# Patient Record
Sex: Female | Born: 1940 | Race: White | Hispanic: No | Smoking: Former smoker
Health system: Southern US, Community
[De-identification: ages and names within clinical notes are randomized; demographics above are authoritative.]

## PROBLEM LIST (undated history)

## (undated) DIAGNOSIS — I10 Essential (primary) hypertension: Secondary | ICD-10-CM

## (undated) DIAGNOSIS — R55 Syncope and collapse: Secondary | ICD-10-CM

## (undated) DIAGNOSIS — H269 Unspecified cataract: Secondary | ICD-10-CM

## (undated) DIAGNOSIS — I4510 Unspecified right bundle-branch block: Secondary | ICD-10-CM

## (undated) DIAGNOSIS — I441 Atrioventricular block, second degree: Secondary | ICD-10-CM

## (undated) DIAGNOSIS — D649 Anemia, unspecified: Secondary | ICD-10-CM

## (undated) DIAGNOSIS — Z515 Encounter for palliative care: Secondary | ICD-10-CM

## (undated) DIAGNOSIS — R001 Bradycardia, unspecified: Secondary | ICD-10-CM

## (undated) DIAGNOSIS — Z7189 Other specified counseling: Secondary | ICD-10-CM

## (undated) DIAGNOSIS — K631 Perforation of intestine (nontraumatic): Secondary | ICD-10-CM

## (undated) DIAGNOSIS — F0390 Unspecified dementia without behavioral disturbance: Secondary | ICD-10-CM

## (undated) DIAGNOSIS — I058 Other rheumatic mitral valve diseases: Secondary | ICD-10-CM

## (undated) DIAGNOSIS — N184 Chronic kidney disease, stage 4 (severe): Secondary | ICD-10-CM

## (undated) DIAGNOSIS — E039 Hypothyroidism, unspecified: Secondary | ICD-10-CM

## (undated) HISTORY — DX: Atrioventricular block, second degree: I44.1

## (undated) HISTORY — DX: Other rheumatic mitral valve diseases: I05.8

## (undated) HISTORY — DX: Other specified counseling: Z71.89

## (undated) HISTORY — DX: Unspecified right bundle-branch block: I45.10

## (undated) HISTORY — DX: Encounter for palliative care: Z51.5

---

## 2015-12-10 ENCOUNTER — Other Ambulatory Visit: Payer: Self-pay | Admitting: Nephrology

## 2015-12-10 DIAGNOSIS — I10 Essential (primary) hypertension: Secondary | ICD-10-CM

## 2015-12-10 DIAGNOSIS — N183 Chronic kidney disease, stage 3 unspecified: Secondary | ICD-10-CM

## 2015-12-29 ENCOUNTER — Ambulatory Visit
Admission: RE | Admit: 2015-12-29 | Discharge: 2015-12-29 | Disposition: A | Discharge status: Home / Self Care | Payer: Medicare Other | Source: Ambulatory Visit | Attending: Nephrology | Admitting: Nephrology

## 2015-12-29 ENCOUNTER — Other Ambulatory Visit: Payer: Self-pay | Admitting: Nephrology

## 2015-12-29 DIAGNOSIS — N183 Chronic kidney disease, stage 3 unspecified: Secondary | ICD-10-CM

## 2015-12-29 DIAGNOSIS — I10 Essential (primary) hypertension: Secondary | ICD-10-CM

## 2018-05-14 ENCOUNTER — Emergency Department (HOSPITAL_COMMUNITY): Payer: Medicare Other

## 2018-05-14 ENCOUNTER — Encounter (HOSPITAL_COMMUNITY): Payer: Self-pay | Admitting: *Deleted

## 2018-05-14 ENCOUNTER — Inpatient Hospital Stay (HOSPITAL_COMMUNITY)
Admission: EM | Admit: 2018-05-14 | Discharge: 2018-05-17 | DRG: 682 | Disposition: A | Discharge status: Home / Self Care | Payer: Medicare Other | Source: Skilled Nursing Facility | Attending: Internal Medicine | Admitting: Internal Medicine

## 2018-05-14 ENCOUNTER — Other Ambulatory Visit: Payer: Self-pay

## 2018-05-14 DIAGNOSIS — E871 Hypo-osmolality and hyponatremia: Secondary | ICD-10-CM | POA: Diagnosis present

## 2018-05-14 DIAGNOSIS — Z79890 Hormone replacement therapy (postmenopausal): Secondary | ICD-10-CM | POA: Diagnosis not present

## 2018-05-14 DIAGNOSIS — X58XXXA Exposure to other specified factors, initial encounter: Secondary | ICD-10-CM | POA: Diagnosis present

## 2018-05-14 DIAGNOSIS — N183 Chronic kidney disease, stage 3 (moderate): Secondary | ICD-10-CM | POA: Diagnosis present

## 2018-05-14 DIAGNOSIS — F0281 Dementia in other diseases classified elsewhere with behavioral disturbance: Secondary | ICD-10-CM | POA: Diagnosis present

## 2018-05-14 DIAGNOSIS — M109 Gout, unspecified: Secondary | ICD-10-CM | POA: Diagnosis present

## 2018-05-14 DIAGNOSIS — E039 Hypothyroidism, unspecified: Secondary | ICD-10-CM | POA: Diagnosis present

## 2018-05-14 DIAGNOSIS — G9340 Encephalopathy, unspecified: Secondary | ICD-10-CM

## 2018-05-14 DIAGNOSIS — I10 Essential (primary) hypertension: Secondary | ICD-10-CM

## 2018-05-14 DIAGNOSIS — D631 Anemia in chronic kidney disease: Secondary | ICD-10-CM | POA: Diagnosis present

## 2018-05-14 DIAGNOSIS — I5030 Unspecified diastolic (congestive) heart failure: Secondary | ICD-10-CM | POA: Diagnosis not present

## 2018-05-14 DIAGNOSIS — R32 Unspecified urinary incontinence: Secondary | ICD-10-CM | POA: Diagnosis present

## 2018-05-14 DIAGNOSIS — Z79899 Other long term (current) drug therapy: Secondary | ICD-10-CM | POA: Diagnosis not present

## 2018-05-14 DIAGNOSIS — F0390 Unspecified dementia without behavioral disturbance: Secondary | ICD-10-CM | POA: Diagnosis present

## 2018-05-14 DIAGNOSIS — G9341 Metabolic encephalopathy: Secondary | ICD-10-CM | POA: Diagnosis present

## 2018-05-14 DIAGNOSIS — I5032 Chronic diastolic (congestive) heart failure: Secondary | ICD-10-CM | POA: Diagnosis present

## 2018-05-14 DIAGNOSIS — S61412A Laceration without foreign body of left hand, initial encounter: Secondary | ICD-10-CM | POA: Diagnosis present

## 2018-05-14 DIAGNOSIS — Z882 Allergy status to sulfonamides status: Secondary | ICD-10-CM | POA: Diagnosis not present

## 2018-05-14 DIAGNOSIS — E860 Dehydration: Secondary | ICD-10-CM | POA: Diagnosis present

## 2018-05-14 DIAGNOSIS — I4510 Unspecified right bundle-branch block: Secondary | ICD-10-CM | POA: Diagnosis present

## 2018-05-14 DIAGNOSIS — Z8711 Personal history of peptic ulcer disease: Secondary | ICD-10-CM

## 2018-05-14 DIAGNOSIS — Z90711 Acquired absence of uterus with remaining cervical stump: Secondary | ICD-10-CM | POA: Diagnosis not present

## 2018-05-14 DIAGNOSIS — Z8719 Personal history of other diseases of the digestive system: Secondary | ICD-10-CM | POA: Diagnosis not present

## 2018-05-14 DIAGNOSIS — J9811 Atelectasis: Secondary | ICD-10-CM | POA: Diagnosis present

## 2018-05-14 DIAGNOSIS — K5900 Constipation, unspecified: Secondary | ICD-10-CM | POA: Diagnosis present

## 2018-05-14 DIAGNOSIS — G3109 Other frontotemporal dementia: Secondary | ICD-10-CM | POA: Diagnosis not present

## 2018-05-14 DIAGNOSIS — N189 Chronic kidney disease, unspecified: Secondary | ICD-10-CM

## 2018-05-14 DIAGNOSIS — N179 Acute kidney failure, unspecified: Secondary | ICD-10-CM | POA: Diagnosis present

## 2018-05-14 DIAGNOSIS — Z885 Allergy status to narcotic agent status: Secondary | ICD-10-CM | POA: Diagnosis not present

## 2018-05-14 DIAGNOSIS — I440 Atrioventricular block, first degree: Secondary | ICD-10-CM | POA: Diagnosis present

## 2018-05-14 DIAGNOSIS — D649 Anemia, unspecified: Secondary | ICD-10-CM | POA: Diagnosis not present

## 2018-05-14 DIAGNOSIS — E872 Acidosis: Secondary | ICD-10-CM | POA: Diagnosis present

## 2018-05-14 DIAGNOSIS — S61402A Unspecified open wound of left hand, initial encounter: Secondary | ICD-10-CM | POA: Diagnosis not present

## 2018-05-14 DIAGNOSIS — N184 Chronic kidney disease, stage 4 (severe): Secondary | ICD-10-CM | POA: Diagnosis present

## 2018-05-14 DIAGNOSIS — E1122 Type 2 diabetes mellitus with diabetic chronic kidney disease: Secondary | ICD-10-CM | POA: Diagnosis present

## 2018-05-14 DIAGNOSIS — R9389 Abnormal findings on diagnostic imaging of other specified body structures: Secondary | ICD-10-CM

## 2018-05-14 DIAGNOSIS — I130 Hypertensive heart and chronic kidney disease with heart failure and stage 1 through stage 4 chronic kidney disease, or unspecified chronic kidney disease: Secondary | ICD-10-CM | POA: Diagnosis present

## 2018-05-14 HISTORY — DX: Essential (primary) hypertension: I10

## 2018-05-14 HISTORY — DX: Unspecified dementia, unspecified severity, without behavioral disturbance, psychotic disturbance, mood disturbance, and anxiety: F03.90

## 2018-05-14 HISTORY — DX: Chronic kidney disease, unspecified: N18.9

## 2018-05-14 HISTORY — DX: Encephalopathy, unspecified: G93.40

## 2018-05-14 LAB — COMPREHENSIVE METABOLIC PANEL
ALT: 17 U/L (ref 0–44)
AST: 24 U/L (ref 15–41)
Albumin: 3.7 g/dL (ref 3.5–5.0)
Alkaline Phosphatase: 54 U/L (ref 38–126)
Anion gap: 15 (ref 5–15)
BUN: 58 mg/dL — AB (ref 8–23)
CO2: 17 mmol/L — ABNORMAL LOW (ref 22–32)
Calcium: 9.8 mg/dL (ref 8.9–10.3)
Chloride: 100 mmol/L (ref 98–111)
Creatinine, Ser: 2.47 mg/dL — ABNORMAL HIGH (ref 0.44–1.00)
GFR calc Af Amer: 21 mL/min — ABNORMAL LOW (ref >60)
GFR calc non Af Amer: 18 mL/min — ABNORMAL LOW (ref >60)
Glucose, Bld: 330 mg/dL — ABNORMAL HIGH (ref 70–99)
Potassium: 4.7 mmol/L (ref 3.5–5.1)
Sodium: 132 mmol/L — ABNORMAL LOW (ref 135–145)
TOTAL PROTEIN: 6.4 g/dL — AB (ref 6.5–8.1)
Total Bilirubin: 0.8 mg/dL (ref 0.3–1.2)

## 2018-05-14 LAB — LACTIC ACID, PLASMA
Lactic Acid, Venous: 4.1 mmol/L (ref 0.5–1.9)
Lactic Acid, Venous: 2.5 mmol/L (ref 0.5–1.9)
Lactic Acid, Venous: 1.6 mmol/L (ref 0.5–1.9)

## 2018-05-14 LAB — URINALYSIS, ROUTINE W REFLEX MICROSCOPIC
BILIRUBIN URINE: NEGATIVE
GLUCOSE, UA: 150 mg/dL — AB
KETONES UR: NEGATIVE mg/dL
LEUKOCYTES UA: NEGATIVE
NITRITE: NEGATIVE
PH: 6 (ref 5.0–8.0)
Protein, ur: >=300 mg/dL — AB
Specific Gravity, Urine: 1.013 (ref 1.005–1.030)

## 2018-05-14 LAB — CBC WITH DIFFERENTIAL/PLATELET
Abs Immature Granulocytes: 0.11 10*3/uL — ABNORMAL HIGH (ref 0.00–0.07)
Basophils Absolute: 0 10*3/uL (ref 0.0–0.1)
Basophils Relative: 0 %
Eosinophils Absolute: 0 10*3/uL (ref 0.0–0.5)
Eosinophils Relative: 0 %
HCT: 37.6 % (ref 36.0–46.0)
Hemoglobin: 12 g/dL (ref 12.0–15.0)
Immature Granulocytes: 1 %
LYMPHS ABS: 0.3 10*3/uL — AB (ref 0.7–4.0)
Lymphocytes Relative: 2 %
MCH: 29.3 pg (ref 26.0–34.0)
MCHC: 31.9 g/dL (ref 30.0–36.0)
MCV: 91.7 fL (ref 80.0–100.0)
Monocytes Absolute: 0.3 10*3/uL (ref 0.1–1.0)
Monocytes Relative: 2 %
NRBC: 0 % (ref 0.0–0.2)
Neutro Abs: 15.4 10*3/uL — ABNORMAL HIGH (ref 1.7–7.7)
Neutrophils Relative %: 95 %
Platelets: 217 10*3/uL (ref 150–400)
RBC: 4.1 MIL/uL (ref 3.87–5.11)
RDW: 14.3 % (ref 11.5–15.5)
WBC: 16.1 10*3/uL — ABNORMAL HIGH (ref 4.0–10.5)

## 2018-05-14 LAB — PROTIME-INR
INR: 1.01
Prothrombin Time: 13.2 seconds (ref 11.4–15.2)

## 2018-05-14 LAB — INFLUENZA PANEL BY PCR (TYPE A & B)
Influenza A By PCR: NEGATIVE
Influenza B By PCR: NEGATIVE

## 2018-05-14 LAB — CK: Total CK: 141 U/L (ref 38–234)

## 2018-05-14 LAB — AMMONIA: AMMONIA: 9 umol/L (ref 9–35)

## 2018-05-14 LAB — TROPONIN I: Troponin I: 0.12 ng/mL (ref <0.03)

## 2018-05-14 LAB — TSH: TSH: 0.82 u[IU]/mL (ref 0.350–4.500)

## 2018-05-14 LAB — GLUCOSE, CAPILLARY: Glucose-Capillary: 142 mg/dL — ABNORMAL HIGH (ref 70–99)

## 2018-05-14 MED ORDER — SODIUM CHLORIDE 0.9 % IV SOLN
1.0000 g | INTRAVENOUS | Status: DC
  Administered 2018-05-15: 1 g via INTRAVENOUS
  Filled 2018-05-14 (×2): qty 1

## 2018-05-14 MED ORDER — VANCOMYCIN HCL 10 G IV SOLR: 1250.0000 mg | INTRAVENOUS | Status: DC

## 2018-05-14 MED ORDER — SENNOSIDES-DOCUSATE SODIUM 8.6-50 MG PO TABS: 1.0000 | ORAL_TABLET | Freq: Every evening | ORAL | Status: DC | PRN

## 2018-05-14 MED ORDER — ACETAMINOPHEN 650 MG RE SUPP: 650.0000 mg | Freq: Four times a day (QID) | RECTAL | Status: DC | PRN

## 2018-05-14 MED ORDER — VANCOMYCIN HCL 10 G IV SOLR
2500.0000 mg | Freq: Once | INTRAVENOUS | Status: AC
  Administered 2018-05-14: 2500 mg via INTRAVENOUS
  Filled 2018-05-14: qty 2500

## 2018-05-14 MED ORDER — METRONIDAZOLE IN NACL 5-0.79 MG/ML-% IV SOLN
500.0000 mg | Freq: Three times a day (TID) | INTRAVENOUS | Status: DC
  Administered 2018-05-14: 500 mg via INTRAVENOUS
  Filled 2018-05-14: qty 100

## 2018-05-14 MED ORDER — HEPARIN SODIUM (PORCINE) 5000 UNIT/ML IJ SOLN
5000.0000 [IU] | Freq: Three times a day (TID) | INTRAMUSCULAR | Status: DC
  Administered 2018-05-15 – 2018-05-17 (×8): 5000 [IU] via SUBCUTANEOUS
  Filled 2018-05-14 (×9): qty 1

## 2018-05-14 MED ORDER — LEVOTHYROXINE SODIUM 25 MCG PO TABS
125.0000 ug | ORAL_TABLET | Freq: Every day | ORAL | Status: DC
  Administered 2018-05-15 – 2018-05-17 (×3): 125 ug via ORAL
  Filled 2018-05-14 (×3): qty 1

## 2018-05-14 MED ORDER — LATANOPROST 0.005 % OP SOLN
1.0000 [drp] | Freq: Every day | OPHTHALMIC | Status: DC
  Administered 2018-05-15 – 2018-05-16 (×3): 1 [drp] via OPHTHALMIC
  Filled 2018-05-14: qty 2.5

## 2018-05-14 MED ORDER — INSULIN ASPART 100 UNIT/ML ~~LOC~~ SOLN
0.0000 [IU] | Freq: Three times a day (TID) | SUBCUTANEOUS | Status: DC
  Administered 2018-05-15 – 2018-05-16 (×4): 1 [IU] via SUBCUTANEOUS
  Administered 2018-05-17 (×2): 2 [IU] via SUBCUTANEOUS

## 2018-05-14 MED ORDER — HYPROMELLOSE (GONIOSCOPIC) 2.5 % OP SOLN
1.0000 [drp] | Freq: Three times a day (TID) | OPHTHALMIC | Status: DC
  Filled 2018-05-14: qty 15

## 2018-05-14 MED ORDER — OLANZAPINE 7.5 MG PO TABS
7.5000 mg | ORAL_TABLET | Freq: Every day | ORAL | Status: DC
  Administered 2018-05-15: 7.5 mg via ORAL
  Filled 2018-05-14 (×4): qty 1

## 2018-05-14 MED ORDER — DICLOFENAC SODIUM 1 % TD GEL
2.0000 g | Freq: Three times a day (TID) | TRANSDERMAL | Status: DC
  Administered 2018-05-15 – 2018-05-17 (×9): 2 g via TOPICAL
  Filled 2018-05-14: qty 100

## 2018-05-14 MED ORDER — AMLODIPINE BESYLATE 5 MG PO TABS: 5.0000 mg | ORAL_TABLET | Freq: Every evening | ORAL | Status: DC

## 2018-05-14 MED ORDER — VANCOMYCIN HCL IN DEXTROSE 1-5 GM/200ML-% IV SOLN: 1000.0000 mg | Freq: Once | INTRAVENOUS | Status: DC

## 2018-05-14 MED ORDER — POLYVINYL ALCOHOL 1.4 % OP SOLN
1.0000 [drp] | Freq: Three times a day (TID) | OPHTHALMIC | Status: DC
  Administered 2018-05-15 – 2018-05-17 (×8): 1 [drp] via OPHTHALMIC
  Filled 2018-05-14: qty 15

## 2018-05-14 MED ORDER — SODIUM CHLORIDE 0.9 % IV SOLN
2.0000 g | Freq: Once | INTRAVENOUS | Status: AC
  Administered 2018-05-14: 2 g via INTRAVENOUS
  Filled 2018-05-14: qty 2

## 2018-05-14 MED ORDER — ALLOPURINOL 100 MG PO TABS
100.0000 mg | ORAL_TABLET | Freq: Every day | ORAL | Status: DC
  Administered 2018-05-15 – 2018-05-17 (×3): 100 mg via ORAL
  Filled 2018-05-14 (×3): qty 1

## 2018-05-14 MED ORDER — SODIUM CHLORIDE 0.9 % IV BOLUS (SEPSIS)
1000.0000 mL | Freq: Once | INTRAVENOUS | Status: AC
  Administered 2018-05-14: 1000 mL via INTRAVENOUS

## 2018-05-14 MED ORDER — SODIUM CHLORIDE 0.9 % IV SOLN
1000.0000 mL | INTRAVENOUS | Status: DC
  Administered 2018-05-14 – 2018-05-15 (×4): 1000 mL via INTRAVENOUS

## 2018-05-14 MED ORDER — ACETAMINOPHEN 325 MG PO TABS
650.0000 mg | ORAL_TABLET | Freq: Four times a day (QID) | ORAL | Status: DC | PRN
  Administered 2018-05-15 (×2): 650 mg via ORAL
  Filled 2018-05-14 (×2): qty 2

## 2018-05-14 NOTE — ED Triage Notes (Signed)
ADM  MD at bed side to assess PT  For ADM. Pt agitated attempting out of bed. Pt tearfu and crying.

## 2018-05-14 NOTE — ED Triage Notes (Signed)
PT from SNF ? Name . EMS called because of fall or UTI. Pt has dementia AO x1

## 2018-05-14 NOTE — H&P (Signed)
Date: 05/14/2018               Patient Name:  Daisy Parker MRN: [id number]  DOB: [birth year] Age / Sex: 78 y.o., female   PCP: Patient, No Pcp Per         Medical Service: Internal Medicine Teaching Service         Attending Physician: Dr. Sid Falcon, MD    First Contact: Dr. Annie Paras Pager: [phone]  Second Contact: Dr. Shan Levans Pager: [phone]       After Hours (After 5p/  First Contact Pager: [phone]  weekends / holidays): Second Contact Pager: [phone]   Chief Complaint: encephalopathy  History of Present Illness: Daisy Parker is a 78 yo female with a medical history of hypothyroidism, HTN, DMII, HFpEF (EF >55%, grade III diastolic dysfunction on 5784 ECHO), CKDIII, duodenal perforation, and neurocognitive disorder with frontotemporal dementia presenting to [facility] from [facility] for encephalopathy.  History obtained from daughter. Daughter states a couple of days ago, her facility notified her that Daisy Parker had been wandering into other patient rooms. She received another call this morning stating that Daisy Parker was found lying in the floor in a room that was not hers and was significantly more agitated and she was being sent to the ED. Other than her mental status, her daughter notes Daisy Parker is shivering more today. Other than that, Daisy Parker has not been complaining of pain anywhere. Her daughter believes she started a new blood pressure medication recently, but notes no other recent medication changes. The patient herself denies pain. She is not oriented to person, place, or time. She does not answer questions appropriately or follow commands.  At baseline, Daisy Parker sometimes gets confused on day of the week or time of day but is otherwise able to recognize her daughter and follow conversation. She uses a walker for ambulation. She usually knows which room she lives in. She does occasionally have behavioral disturbances. Of note, she had an involuntary admission  to the [facility] geropsychiatry unit for paranoia, agitation, and medication refusal about one year ago. Since then, she has been living in nursing facilities.  Upon arrival to the ED, patient was afebrile, bradycardic at 38, hypertensive to 144/73, and saturating 93% on room air. Lab work was significant for leukocytosis to 16.1, Na 132, bicarb 17, glucose 330, BUN 58, cR 2.47 (BL 1.5), anion gap 15, lactic acidosis at 4.1 --> improved to 1.6, negative flu, normal ammonia. UA showed glucose, small hemoglobin (0-5 RBCs), over 300 protein, neg leuks, neg nitrites. CXR is poor quality, but shows some atelectasis in the left base. Head CT showed chronic microvascular ischemic changes. The patient received 1L NS and was started on vanc and cefepime.   Meds: Current Meds  Medication Sig  . acetaminophen (TYLENOL) 500 MG tablet Take 1,000 mg by mouth 3 (three) times daily. 0800, 1400, 2000.  Marland Kitchen allopurinol (ZYLOPRIM) 100 MG tablet Take 100 mg by mouth daily.  Marland Kitchen amLODipine (NORVASC) 5 MG tablet Take 5 mg by mouth every evening.  . Cholecalciferol (VITAMIN D) 50 MCG (2000 UT) tablet Take 2,000 Units by mouth daily.  . diclofenac sodium (VOLTAREN) 1 % GEL Apply 2 g topically 3 (three) times daily. To knees, hips, and lower back.  . fluticasone (FLONASE) 50 MCG/ACT nasal spray Place 1 spray into both nostrils daily.  . hydroxypropyl methylcellulose / hypromellose (ISOPTO TEARS / GONIOVISC) 2.5 % ophthalmic solution Place 1 drop into both eyes 3 (  three) times daily. 0800, 1400, 2000.  Marland Kitchen latanoprost (XALATAN) 0.005 % ophthalmic solution Place 1 drop into both eyes at bedtime.  Marland Kitchen levothyroxine (SYNTHROID, LEVOTHROID) 125 MCG tablet Take 125 mcg by mouth daily before breakfast.  . losartan-hydrochlorothiazide (HYZAAR) 50-12.5 MG tablet Take 1 tablet by mouth daily.  . Multiple Vitamins-Iron (DAILY VITE MULTIVITAMIN/IRON) TABS Take 1 tablet by mouth daily.  Marland Kitchen OLANZapine (ZYPREXA) 7.5 MG tablet Take 7.5 mg by mouth at  bedtime.   Allergies: Allergies as of 05/14/2018 - Review Complete 05/14/2018  Allergen Reaction Noted  . Codeine Anaphylaxis 05/14/2018  . Sulfa antibiotics Anaphylaxis 05/14/2018   Past Medical History:  Diagnosis Date  . Dementia ([place])   . Hypertension    Surgical history: Partial hysterectomy. Partial parathyroidectomy.  Family History: No known family history of HTN or DM.  Social History: Patient currently lives in [place]. She is able to bathe and dress herself. She ambulates with a walker. Daughter denies history of smoking, etoh, and illicit drug use.  Review of Systems: A complete ROS was negative except as per HPI.   Physical Exam: Blood pressure 132/68, pulse 90, temperature 99.3 F (37.4 C), temperature source Rectal, height 5\' 5"  (1.651 m), weight 124.7 kg, SpO2 100 %.  Constitutional: Well-developed, well-nourished. Alert. Not oriented to person, place, or time. Appeared agitated, frequently pulled at her lines and tried to get out of bed. She often said "help me" and threatened to hit the phlebotomist trying to draw blood. HEENT: Pupils are equal, round, and reactive to light. EOM are normal. Dry mucous membranes. Neuro: She does not answer questions appropriately or follow commands. She moves all 4 extremities spontaneously with 5/5 strength.  Cardiovascular: Normal rate and regular rhythm. No murmurs, rubs, or gallops. Pulmonary/Chest: Effort normal on room air. Decreased breath sounds due to poor participation with exam. Abdominal: Bowel sounds present. Soft, non-distended, non-tender. Ext: Left dorsal hand laceration with bleeding, bandaged. No lower extremity edema. Skin: Warm and dry.   CXR: personally reviewed my interpretation is poor quality, but shows some atelectasis in the left base  Assessment & Plan by Problem: Active problems: Encephalopathy AKI on CKD  Daisy Parker is a 78 yo female with a medical history of hypothyroidism, HTN, DMII,  HFpEF (EF >55%, grade III diastolic dysfunction on 1829 ECHO), CKDIII, duodenal perforation, and neurocognitive disorder with frontotemporal dementia who presented from [facility] for encephalopathy.  Encephalopathy - Patient is usually alert and oriented at baseline. She currently is disoriented x3, does not recognize her daughter, and is very agitated. She does have a history of neurocognitive disorder with behavioral disturbances, requiring psychiatric involuntary admission in the past. - Ddx includes infection, dehydration, and cardiac etiology - Infection: afebrile, but leukocytosis and elevated lactate. Lactic acidosis resolved after 1L NS. No obvious source of infection. Urine is clean. CXR is of poor quality, but shows no obvious pneumonia. No chronic wounds seen on skin exam. - Dehydration: patient has dry mucous membranes and AKI. Daughter believes she has been eating and drinking okay at her facility. UA shows glucosuria, which may contribute to dehydration. - Cardiac etiology: patient was bradycardic in the 30s on arrival. Will rule out heart block and symptomatic bradycardia. No known history of CAD, but she has HTN, DM, and HLD.  - The patient's only centrally acting medication is Zyprexa, which is not a new medication for her - Mild hyponatremia, but no other electrolyte abnormalities to explain AMS Plan - Bedside sitter - Vanc and cefepime -  IVF - Continue home zyprexa 7.5mg  qhs - Trend CBC and BMP - F/u blood cultures - Repeat CXR - Troponin - EKG - Tele - Avoid centrally acting meds  AKI on CKD - Cr elevated to 2.47 (basline is 1.5) - Likely prerenal 2/2 dehydration, possibly in the setting of infection - Can't rule out rhabdo. Unclear if patient was "found down." However, UA shows small hemoglobin with no RBCs Plan - IVF - Trend Cr - CK  HTN - BP 144/73 on arrival. Home regimen includes amlodipine 5mg  daily and losartan-hctz 50-12.5mg  daily. Plan - Hold  amlodipine for bradycardia  - Hold losartan-hctz for AKI  DMII - No recent A1c. Patient does not take any medications for her diabetes as she always refuses the finger stick.  - Glucose is elevated and glucosuria is present Plan - A1c - SSI  Hypothyroidism - TSH - Continue home synthroid 17mcg daily  HFpEF  - EF >55%, grade III diastolic dysfunction on 2080 ECHO - No signs of volume overload on exam  Gout: continue home allopurinol 100mg  daily  FEN: NS 138ml/hr, carb modified diet, replace electrolytes as needed  DVT ppx: [place] heparin Code status: FULL code  Dispo: Admit patient to Inpatient with expected length of stay greater than 2 midnights.  Signed: Corinne Ports, MD 05/14/2018, 4:19 PM  Pager: [phone]

## 2018-05-14 NOTE — ED Notes (Signed)
Critical lactic acid of 4.1 called by main lab. MD notified

## 2018-05-14 NOTE — ED Provider Notes (Signed)
[place] EMERGENCY DEPARTMENT Provider Note   CSN: [id number] Arrival date & time: 05/14/18  1937     History   Chief Complaint Chief Complaint  Patient presents with  . Fall    HPI Daisy Parker is a 78 y.o. female.  HPI Patient is sent to the emergency department from [facility] with very little ancillary history.  Note on facesheet states, "fell yesterday AMS, unsteady CBG equals 359."  EMS did not have much additional history, apparently fall was unwitnessed.  Patient does not give any useful history.  She is lying on her side resting.  She objects to being moved much.  She does advise when I asked her to open her eyes that she could open them but does not want to.  When rolled onto her side she repeats "leave it be, leave it be."  Main diagnosis is unspecified dementia without behavioral disturbance o face sheet without indication of baseline functioning level.  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  At that time, notes indicate the patient was verbal and functioning in terms of ADL albeit with psychiatric issues impeding care. Past Medical History:  Diagnosis Date  . Dementia ([place])   . Hypertension     There are no active problems to display for this patient.   History reviewed. No pertinent surgical history.   OB History   No obstetric history on file.      Home Medications    Prior to Admission medications   Medication Sig Start Date End Date Taking? Authorizing Provider  acetaminophen (TYLENOL) 500 MG tablet Take 1,000 mg by mouth 3 (three) times daily. 0800, 1400, 2000.   Yes [provider]  allopurinol (ZYLOPRIM) 100 MG tablet Take 100 mg by mouth daily.   Yes [provider]  amLODipine (NORVASC) 5 MG tablet  Take 5 mg by mouth every evening.   Yes [provider]  Cholecalciferol (VITAMIN D) 50 MCG (2000 UT) tablet Take 2,000 Units by mouth daily.   Yes [provider]  diclofenac sodium (VOLTAREN) 1 % GEL Apply 2 g topically 3 (three) times daily. To knees, hips, and lower back.   Yes [provider]  fluticasone (FLONASE) 50 MCG/ACT nasal spray Place 1 spray into both nostrils daily.   Yes [provider]  hydroxypropyl methylcellulose / hypromellose (ISOPTO TEARS / GONIOVISC) 2.5 % ophthalmic solution Place 1 drop into both eyes 3 (three) times daily. 0800, 1400, 2000.   Yes [provider]  latanoprost (XALATAN) 0.005 % ophthalmic solution Place 1 drop into both eyes at bedtime.   Yes [provider]  levothyroxine (SYNTHROID, LEVOTHROID) 125 MCG tablet Take 125 mcg by mouth daily before breakfast.   Yes [provider]  losartan-hydrochlorothiazide (HYZAAR) 50-12.5 MG tablet Take 1 tablet by mouth daily.   Yes [provider]  Multiple Vitamins-Iron (DAILY VITE MULTIVITAMIN/IRON) TABS Take 1 tablet by mouth daily.   Yes [provider]  OLANZapine (ZYPREXA) 7.5 MG tablet Take 7.5 mg by mouth at bedtime.   Yes [provider]    Family History History reviewed. No pertinent family history.  Social History Social History   Tobacco Use  . Smoking status: Former Research scientist (life sciences)  . Smokeless tobacco: Current User  Substance Use Topics  . Alcohol use: Never    Frequency: Never  .  Drug use: Never     Allergies   Codeine and Sulfa antibiotics   Review of Systems Review of Systems Level 5 caveat cannot obtain review of systems due to dementia  Physical Exam Updated Vital Signs BP (!) 144/73   Pulse (!) 38   Temp 99.3 F (37.4 C) (Rectal)   Ht 5\' 5"  (1.651 m)   Wt 124.7 kg   SpO2 93%   BMI 45.76 kg/m   Physical Exam Constitutional:      Comments: Patient is lying on her left side resting.  No  respiratory distress.  She rouses to light stimulus but is not very interactive.  HENT:     Head: Normocephalic and atraumatic.     Nose: Nose normal.     Mouth/Throat:     Mouth: Mucous membranes are dry.  Eyes:     Extraocular Movements: Extraocular movements intact.     Pupils: Pupils are equal, round, and reactive to light.  Cardiovascular:     Rate and Rhythm: Normal rate and regular rhythm.  Pulmonary:     Effort: Pulmonary effort is normal.     Breath sounds: Normal breath sounds.  Chest:     Chest wall: No tenderness.  Abdominal:     General: There is no distension.     Palpations: Abdomen is soft.     Tenderness: There is no guarding.  Musculoskeletal:     Comments: Patient is lying on her left side with her leg slightly flexed.  No evident deformities areas of effusion or abrasion.  She does not like having her legs moved.  She objects to having her leg straight in order to be rolled on the opposite side.  No evident upper extremity deformities.  Patient is holding both hands clasped under her chin for resting.  She does have minor skin tear on the left dorsum.  This appears to be healing well.  There is no significant ecchymoses or erythema.  Again patient objects to being rolled onto her back and having her extremities moved about.  No apparent localization or evident deformities.  Skin:    General: Skin is warm and dry.  Neurological:     Comments: Patient will interact with verbal stimuli.  Much of her responses difficult to understand.  She only puts a few words together at a time.  When being moved however she clearly objects.  No evident focal motor deficits.      ED Treatments / Results  Labs (all labs ordered are listed, but only abnormal results are displayed) Labs Reviewed  URINALYSIS, ROUTINE W REFLEX MICROSCOPIC - Abnormal; Notable for the following components:      Result Value   Glucose, UA 150 (*)    Hgb urine dipstick SMALL (*)    Protein, ur >=300 (*)     Bacteria, UA RARE (*)    All other components within normal limits  COMPREHENSIVE METABOLIC PANEL - Abnormal; Notable for the following components:   Sodium 132 (*)    CO2 17 (*)    Glucose, Bld 330 (*)    BUN 58 (*)    Creatinine, Ser 2.47 (*)    Total Protein 6.4 (*)    GFR calc non Af Amer 18 (*)    GFR calc Af Amer 21 (*)    All other components within normal limits  LACTIC ACID, PLASMA - Abnormal; Notable for the following components:   Lactic Acid, Venous 4.1 (*)    All other components within  normal limits  LACTIC ACID, PLASMA - Abnormal; Notable for the following components:   Lactic Acid, Venous 2.5 (*)    All other components within normal limits  CBC WITH DIFFERENTIAL/PLATELET - Abnormal; Notable for the following components:   WBC 16.1 (*)    Neutro Abs 15.4 (*)    Lymphs Abs 0.3 (*)    Abs Immature Granulocytes 0.11 (*)    All other components within normal limits  CULTURE, BLOOD (ROUTINE X 2)  CULTURE, BLOOD (ROUTINE X 2)  PROTIME-INR  INFLUENZA PANEL BY PCR (TYPE A & B)  AMMONIA  CK  LACTIC ACID, PLASMA  LACTIC ACID, PLASMA    EKG None  Radiology Ct Head Wo Contrast  Result Date: 05/14/2018 CLINICAL DATA:  Possible fall today in a patient with dementia. EXAM: CT HEAD WITHOUT CONTRAST TECHNIQUE: Contiguous axial images were obtained from the base of the skull through the vertex without intravenous contrast. COMPARISON:  None. FINDINGS: Brain: No evidence of acute infarction, hemorrhage, hydrocephalus, extra-axial collection or mass lesion/mass effect. Atrophy and chronic microvascular ischemic change noted. Vascular: No hyperdense vessel or unexpected calcification. Skull: Intact.  No focal lesion. Sinuses/Orbits: Negative. Other: None. IMPRESSION: No acute abnormality. Atrophy and chronic microvascular ischemic change. Electronically Signed   By: Inge Rise M.D.   On: 05/14/2018 11:11   Dg Chest Port 1 View  Result Date: 05/14/2018 CLINICAL DATA:   Recent mental status change EXAM: PORTABLE CHEST 1 VIEW COMPARISON:  None. FINDINGS: Cardiac shadow is mildly enlarged but accentuated by the frontal technique. Aortic calcifications are noted. Platelike atelectasis/scarring is noted in the left base. No acute bony abnormality is noted. IMPRESSION: Mild changes in the left base as described. Electronically Signed   By: Inez Catalina M.D.   On: 05/14/2018 11:00    Procedures Procedures (including critical care time) CRITICAL CARE Performed by: Charlesetta Shanks   Total critical care time: 45 minutes  Critical care time was exclusive of separately billable procedures and treating other patients.  Critical care was necessary to treat or prevent imminent or life-threatening deterioration.  Critical care was time spent personally by me on the following activities: development of treatment plan with patient and/or surrogate as well as nursing, discussions with consultants, evaluation of patient's response to treatment, examination of patient, obtaining history from patient or surrogate, ordering and performing treatments and interventions, ordering and review of laboratory studies, ordering and review of radiographic studies, pulse oximetry and re-evaluation of patient's condition. Medications Ordered in ED Medications  sodium chloride 0.9 % bolus 1,000 mL (1,000 mLs Intravenous New Bag/Given 05/14/18 1359)    Followed by  0.9 %  sodium chloride infusion (has no administration in time range)  ceFEPIme (MAXIPIME) 2 g in sodium chloride 0.9 % 100 mL IVPB (has no administration in time range)  metroNIDAZOLE (FLAGYL) IVPB 500 mg (has no administration in time range)  vancomycin (VANCOCIN) 2,500 mg in sodium chloride 0.9 % 500 mL IVPB (has no administration in time range)  ceFEPIme (MAXIPIME) 1 g in sodium chloride 0.9 % 100 mL IVPB (has no administration in time range)  vancomycin (VANCOCIN) 1,250 mg in sodium chloride 0.9 % 250 mL IVPB (has no  administration in time range)     Initial Impression / Assessment and Plan / ED Course  I have reviewed the triage vital signs and the nursing notes.  Pertinent labs & imaging results that were available during my care of the patient were reviewed by me and considered in my medical  decision making (see chart for details).     Patient with confusion and agitation.  Difficult historian.  Level of confusion and dysfunction has been increasing over several days duration.  She has had falls or at least been lying on the floor.  General work-up for possible head injury and sepsis\infectious etiology initiated.  Patient does have elevated lactic acid.  No clear source of infection.  However cultures obtained and empiric antibiotics initiated.  Final Clinical Impressions(s) / ED Diagnoses   Final diagnoses:  Infiltrate noted on imaging study  Encephalopathy acute    ED Discharge Orders    None       Charlesetta Shanks, MD 05/26/18 [phone]

## 2018-05-14 NOTE — ED Triage Notes (Signed)
PT's daughter at bed side.

## 2018-05-14 NOTE — Progress Notes (Signed)
Pharmacy Antibiotic Note  Daisy Parker is a 78 y.o. female admitted on 05/14/2018 with sepsis.  Pharmacy has been consulted for vancomycin/cefepime dosing. Afebrile, WBC 16.1, LA 4.1>2.5. SCr 2.47 on admit, CrCl~25.  1x dose of cefepime/vancomycin/flagyl already ordered in the ED.  Plan: Cefepime 2g IV x 1; then 1g IV q24h Vancomycin 2500mg  IV x1; Vancomycin 1250 mg IV Q 48 hrs. Goal AUC 400-550. Expected AUC: 544 SCr used: 2.47 Flagyl 500mg  IV q8h Monitor clinical progress, c/s, renal function F/u de-escalation plan/LOT, vancomycin levels as indicated   Height: 5\' 5"  (165.1 cm) Weight: 275 lb (124.7 kg) IBW/kg (Calculated) : 57  Temp (24hrs), Avg:99.3 F (37.4 C), Min:99.3 F (37.4 C), Max:99.3 F (37.4 C)  Recent Labs  Lab 05/14/18 1120 05/14/18 1334  WBC 16.1*  --   CREATININE 2.47*  --   LATICACIDVEN 4.1* 2.5*    Estimated Creatinine Clearance: 24.9 mL/min (A) (by C-G formula based on SCr of 2.47 mg/dL (H)).    Allergies  Allergen Reactions  . Codeine Anaphylaxis  . Sulfa Antibiotics Anaphylaxis    Antimicrobials this admission: 2/3 vancomycin >>  2/3 cefepime >>   Dose adjustments this admission:   Microbiology results:   Elicia Lamp, PharmD, BCPS Clinical Pharmacist 05/14/2018 2:27 PM

## 2018-05-14 NOTE — ED Triage Notes (Signed)
PER EMS CBG 359 . PT does not have any listed HX of DM

## 2018-05-15 ENCOUNTER — Inpatient Hospital Stay (HOSPITAL_COMMUNITY): Payer: Medicare Other

## 2018-05-15 DIAGNOSIS — G9341 Metabolic encephalopathy: Secondary | ICD-10-CM

## 2018-05-15 DIAGNOSIS — N179 Acute kidney failure, unspecified: Principal | ICD-10-CM

## 2018-05-15 DIAGNOSIS — X58XXXA Exposure to other specified factors, initial encounter: Secondary | ICD-10-CM

## 2018-05-15 DIAGNOSIS — I130 Hypertensive heart and chronic kidney disease with heart failure and stage 1 through stage 4 chronic kidney disease, or unspecified chronic kidney disease: Secondary | ICD-10-CM

## 2018-05-15 DIAGNOSIS — E039 Hypothyroidism, unspecified: Secondary | ICD-10-CM

## 2018-05-15 DIAGNOSIS — N183 Chronic kidney disease, stage 3 (moderate): Secondary | ICD-10-CM

## 2018-05-15 DIAGNOSIS — Z8719 Personal history of other diseases of the digestive system: Secondary | ICD-10-CM

## 2018-05-15 DIAGNOSIS — G3109 Other frontotemporal dementia: Secondary | ICD-10-CM

## 2018-05-15 DIAGNOSIS — I5030 Unspecified diastolic (congestive) heart failure: Secondary | ICD-10-CM

## 2018-05-15 DIAGNOSIS — Z79890 Hormone replacement therapy (postmenopausal): Secondary | ICD-10-CM

## 2018-05-15 DIAGNOSIS — E1122 Type 2 diabetes mellitus with diabetic chronic kidney disease: Secondary | ICD-10-CM

## 2018-05-15 DIAGNOSIS — S61402A Unspecified open wound of left hand, initial encounter: Secondary | ICD-10-CM

## 2018-05-15 DIAGNOSIS — Z79899 Other long term (current) drug therapy: Secondary | ICD-10-CM

## 2018-05-15 DIAGNOSIS — F0281 Dementia in other diseases classified elsewhere with behavioral disturbance: Secondary | ICD-10-CM

## 2018-05-15 LAB — CBC
HCT: 31.2 % — ABNORMAL LOW (ref 36.0–46.0)
Hemoglobin: 10 g/dL — ABNORMAL LOW (ref 12.0–15.0)
MCH: 29 pg (ref 26.0–34.0)
MCHC: 32.1 g/dL (ref 30.0–36.0)
MCV: 90.4 fL (ref 80.0–100.0)
NRBC: 0 % (ref 0.0–0.2)
Platelets: 195 10*3/uL (ref 150–400)
RBC: 3.45 MIL/uL — ABNORMAL LOW (ref 3.87–5.11)
RDW: 14.3 % (ref 11.5–15.5)
WBC: 11.7 10*3/uL — ABNORMAL HIGH (ref 4.0–10.5)

## 2018-05-15 LAB — HEMOGLOBIN A1C
Hgb A1c MFr Bld: 7.1 % — ABNORMAL HIGH (ref 4.8–5.6)
MEAN PLASMA GLUCOSE: 157.07 mg/dL

## 2018-05-15 LAB — BASIC METABOLIC PANEL
Anion gap: 11 (ref 5–15)
BUN: 46 mg/dL — ABNORMAL HIGH (ref 8–23)
CO2: 19 mmol/L — ABNORMAL LOW (ref 22–32)
Calcium: 8.9 mg/dL (ref 8.9–10.3)
Chloride: 106 mmol/L (ref 98–111)
Creatinine, Ser: 2.19 mg/dL — ABNORMAL HIGH (ref 0.44–1.00)
GFR, EST AFRICAN AMERICAN: 24 mL/min — AB (ref >60)
GFR, EST NON AFRICAN AMERICAN: 21 mL/min — AB (ref >60)
Glucose, Bld: 103 mg/dL — ABNORMAL HIGH (ref 70–99)
Potassium: 4.1 mmol/L (ref 3.5–5.1)
SODIUM: 136 mmol/L (ref 135–145)

## 2018-05-15 LAB — GLUCOSE, CAPILLARY
Glucose-Capillary: 134 mg/dL — ABNORMAL HIGH (ref 70–99)
Glucose-Capillary: 134 mg/dL — ABNORMAL HIGH (ref 70–99)
Glucose-Capillary: 109 mg/dL — ABNORMAL HIGH (ref 70–99)
Glucose-Capillary: 134 mg/dL — ABNORMAL HIGH (ref 70–99)

## 2018-05-15 LAB — TROPONIN I
Troponin I: 0.15 ng/mL (ref <0.03)
Troponin I: 0.12 ng/mL (ref <0.03)

## 2018-05-15 LAB — MAGNESIUM: MAGNESIUM: 1.8 mg/dL (ref 1.7–2.4)

## 2018-05-15 LAB — MRSA PCR SCREENING: MRSA by PCR: NEGATIVE

## 2018-05-15 MED ORDER — VANCOMYCIN HCL 10 G IV SOLR
1250.0000 mg | INTRAVENOUS | Status: DC
  Filled 2018-05-15: qty 1250

## 2018-05-15 MED ORDER — OLANZAPINE 5 MG PO TABS
7.5000 mg | ORAL_TABLET | Freq: Every day | ORAL | Status: DC
  Administered 2018-05-15 – 2018-05-16 (×2): 7.5 mg via ORAL
  Filled 2018-05-15 (×2): qty 2

## 2018-05-15 MED ORDER — HALOPERIDOL LACTATE 5 MG/ML IJ SOLN
1.0000 mg | Freq: Once | INTRAMUSCULAR | Status: AC
  Administered 2018-05-15: 1 mg via INTRAVENOUS
  Filled 2018-05-15: qty 1

## 2018-05-15 NOTE — Progress Notes (Addendum)
   Subjective: Daisy Parker continued to be agitated overnight and was pulling at her lines and trying to get out of bed despite redirection by her daughter and a [employer] cabin crew. She required 1mg  Haldol, which she received at 5:40am. This morning, she is seen lying in bed. She reports that she is "bad off" this morning, but denies any pain. She is not oriented to place. She continuously pulls at her blankets, but she is otherwise relaxed and cooperative.  Objective:  Vital signs in last 24 hours: Vitals:   05/14/18 1930 05/14/18 2038 05/14/18 2132 05/15/18 0414  BP: 95/67 (!) 128/58 (!) 146/65 (!) 145/35  Pulse:  (!) 51 (!) 55 61  Resp:   19 19  Temp:  98.3 F (36.8 C) 98.2 F (36.8 C) 98.6 F (37 C)  TempSrc:  Oral Oral Oral  SpO2:  100% 100% 99%  Weight:   95.3 kg   Height:   5\' 7"  (1.702 m)    Gen: lying in bed, no distress CV: RRR, no murmurs Pulm: CTAB, normal effort on room air Ext: No edema  Assessment/Plan:  Principal Problem:   Encephalopathy acute Active Problems:   Dementia ([facility])   HTN (hypertension)   CKD (chronic kidney disease)  Ms. Ames is a 78 yo female with a medical history of hypothyroidism, HTN, DMII, HFpEF (EF >55%, grade III diastolic dysfunction on 4540 ECHO), CKDIII, duodenal perforation, and neurocognitive disorder with frontotemporal dementia who presented from [facility] for encephalopathy.  Encephalopathy - Patient is usually alert and oriented at baseline. She currently is disoriented to both person and place. She required Haldol for sedation overnight. - Afebrile. Leukocytosis has resolved. No obvious source of infection. CXR without signs of pneumonia. - Patient was bradycardic in the 30s on admission. HR has improved to 50s-60s overnight. Initial troponin was elevated to 0.12 -- 0.15. EKG was limited by motion artifact, but showed sinus bradycardia with first degree AV block, occasional PVCs, and RBBB.  - Encephalopathy is most likely  caused by dehydration in the setting of poor PO intake and glucosuria. Plan - Bedside sitter - Continue vanc and cefepime - Continue IVF (NS 125cc/hr) - Trend troponin - Continue home zyprexa 7.5mg  qhs - F/u blood cultures - Tele - Avoid centrally acting meds  AKI on CKD - Improving. Cr 2.19 today, was 2.47 pm admission (baseline is 1.5) - Likely prerenal 2/2 dehydration, possibly in the setting of infection - CK was wnl, no rhabdo Plan - IVF - Trend Cr  HTN - BP this am 145/35. Home regimen includes amlodipine 5mg  daily and losartan-hctz 50-12.5mg  daily. Plan - Hold amlodipine for bradycardia  - Hold losartan-hctz for AKI  DMII - Patient does not take any medications for her diabetes as she always refuses the finger stick.  - Glucose within goal <180 Plan - A1c - SSI  Hypothyroidism - TSH wnl Plan - Continue home synthroid 154mcg daily  Dispo: Anticipated discharge in approximately 2-3 day(s).   Cristle Jared, Andree Elk, MD 05/15/2018, 6:22 AM Pager: [phone]

## 2018-05-15 NOTE — Progress Notes (Signed)
  Date: 05/15/2018  Patient name: Daisy Parker  Medical record number: [id number]  Date of birth: Sep 26, 1940   I have seen and evaluated Daisy Parker and discussed their care with the Residency Team. Briefly, Ms. Cuthrell is a 78 year old woman with PMH of hypothyroidism, HTN, DM2, HFpEF, CKD 3, FTD who presented with encephalopathy for a few days.  She was noted to be more agitated and more confused, which is different from her baseline.  She has also noted to be aggressive and combative.  She presented from a SNF. In the ED, she was found to be bradycardic with AKI and an AGMA with a lactate of 4.1.  CXR and UA were not revealing for acute infection.  TnI have been 0.12 --> 0.15 --> 0.12.  WBC was initially elevated at 16 which then improved to 11.    Exam:   Vitals:   05/15/18 0906 05/15/18 1030  BP: 136/73 (!) 155/66  Pulse: 87 76  Resp: 18 18  Temp: 98.6 F (37 C) 98.5 F (36.9 C)  SpO2: 100% 99%   General: Lying in bed, at times alert Eyes: Anicteric sclerae, no conjunctival injection CV: RR, NR, no murmur Pulm: Breathing comfortably, no wheezing or rales, possibly some mild crackles at bases Abd: Soft, NT Ext: Thin, no edema Skin: She has wounds on the left posterior hand which appear to be shear wounds, she does not remember how she came by them, no rash on exposed skin Neuro: Affect is angry, confused.  She did not answer orientation questions.  Sitter in room says she has been combative at times.   Assessment and Plan: I have seen and evaluated the patient as outlined above. I agree with the formulated Assessment and Plan as detailed in the residents' note, with the following changes:   1. Acute metabolic encephalopathy - related to renal function - Given the presentation, I think the likely diagnosis here is dehydration leading to elevated urate, Cr, lactic acid and this is likely worsened by glucosuria as she is not currently being treated for her DM2 (glucose on UA, A1C  only 7.1 so reasonable not to treat).  This is also in the setting of continued lisinopril-hctz use.  - would give fluids; NS at 125cc/hr - Would continue Abx for 48 hours, if BC negative at 48 hours, would discontinue Abx and monitor for fever - Trend Cr and WBC - Follow up cultures - Avoid centrally acting medications - Allow higher blood pressure at this time given medication affects as noted in Dr. Arta Silence progress note.  Add on hydralazine if SBP is averaging above 160.   Other issues per Dr. Arta Silence progress note.   Sid Falcon, MD 2/4/20201:10 PM

## 2018-05-15 NOTE — Progress Notes (Signed)
Patient remains confused. Pulling IV off and continue to get out of bed on and off. Daughter in her room to assist in care. Patient requires 1:1 sitter due to confusion and  Agitation. Low bed and tele-sitter ordered for safety.

## 2018-05-16 DIAGNOSIS — G9340 Encephalopathy, unspecified: Secondary | ICD-10-CM

## 2018-05-16 DIAGNOSIS — N179 Acute kidney failure, unspecified: Secondary | ICD-10-CM

## 2018-05-16 DIAGNOSIS — R32 Unspecified urinary incontinence: Secondary | ICD-10-CM

## 2018-05-16 DIAGNOSIS — K5900 Constipation, unspecified: Secondary | ICD-10-CM

## 2018-05-16 HISTORY — DX: Acute kidney failure, unspecified: N17.9

## 2018-05-16 LAB — BASIC METABOLIC PANEL
Anion gap: 8 (ref 5–15)
BUN: 39 mg/dL — ABNORMAL HIGH (ref 8–23)
CO2: 19 mmol/L — ABNORMAL LOW (ref 22–32)
Calcium: 8.4 mg/dL — ABNORMAL LOW (ref 8.9–10.3)
Chloride: 110 mmol/L (ref 98–111)
Creatinine, Ser: 2.05 mg/dL — ABNORMAL HIGH (ref 0.44–1.00)
GFR calc Af Amer: 26 mL/min — ABNORMAL LOW (ref >60)
GFR calc non Af Amer: 23 mL/min — ABNORMAL LOW (ref >60)
GLUCOSE: 114 mg/dL — AB (ref 70–99)
POTASSIUM: 4 mmol/L (ref 3.5–5.1)
Sodium: 137 mmol/L (ref 135–145)

## 2018-05-16 LAB — GLUCOSE, CAPILLARY
GLUCOSE-CAPILLARY: 142 mg/dL — AB (ref 70–99)
Glucose-Capillary: 107 mg/dL — ABNORMAL HIGH (ref 70–99)
Glucose-Capillary: 134 mg/dL — ABNORMAL HIGH (ref 70–99)
Glucose-Capillary: 127 mg/dL — ABNORMAL HIGH (ref 70–99)

## 2018-05-16 LAB — CBC
HEMATOCRIT: 28.6 % — AB (ref 36.0–46.0)
Hemoglobin: 9.1 g/dL — ABNORMAL LOW (ref 12.0–15.0)
MCH: 29 pg (ref 26.0–34.0)
MCHC: 31.8 g/dL (ref 30.0–36.0)
MCV: 91.1 fL (ref 80.0–100.0)
Platelets: 164 10*3/uL (ref 150–400)
RBC: 3.14 MIL/uL — ABNORMAL LOW (ref 3.87–5.11)
RDW: 14.2 % (ref 11.5–15.5)
WBC: 7.4 10*3/uL (ref 4.0–10.5)
nRBC: 0 % (ref 0.0–0.2)

## 2018-05-16 MED ORDER — LACTATED RINGERS IV SOLN
INTRAVENOUS | Status: DC
  Administered 2018-05-16: 14:00:00 via INTRAVENOUS

## 2018-05-16 MED ORDER — GLUCERNA SHAKE PO LIQD
237.0000 mL | Freq: Three times a day (TID) | ORAL | Status: DC
  Administered 2018-05-16 – 2018-05-17 (×6): 237 mL via ORAL
  Filled 2018-05-16 (×6): qty 237

## 2018-05-16 MED ORDER — POLYETHYLENE GLYCOL 3350 17 G PO PACK
17.0000 g | PACK | Freq: Every day | ORAL | Status: DC
  Administered 2018-05-16 – 2018-05-17 (×2): 17 g via ORAL
  Filled 2018-05-16 (×2): qty 1

## 2018-05-16 MED ORDER — LACTATED RINGERS IV BOLUS
1000.0000 mL | Freq: Once | INTRAVENOUS | Status: AC
  Administered 2018-05-16: 1000 mL via INTRAVENOUS

## 2018-05-16 NOTE — Evaluation (Signed)
Physical Therapy Evaluation Patient Details Name: Daisy Parker MRN: [id number] DOB: Feb 01, 1941 Today's Date: 05/16/2018   History of Present Illness  Pt is a 78 y/o female admitted from memory care ALF secondary to AMS. Thought to have encephalopathy likely secondary to dehydration. CT negative for acute abnormality. PMH includes frontotemporal dementia, HTN, DM, and CKD. Daughter reports history of gout in fingers and toes.   Clinical Impression  Pt admitted secondary to problem above with deficits below. Pt up in room attempting to get to the bathroom upon entry. Educated about need for assist and assisted pt to ambulate to [facility]. Pt requiring min guard for safety for ambulation to/from [facility] using RW. Pt refusing further gait secondary to fatigue. Anticipate pt will progress well and be able to d/c back to memory care ALF with HHPT. However, if pt does not progress, may need to consider short term SNF. Will continue to follow acutely to maximize functional mobility independence and safety.     Follow Up Recommendations Other (comment);Supervision/Assistance - 24 hour(return to memory care ALF with HHPT )    Equipment Recommendations  None recommended by PT    Recommendations for Other Services OT consult     Precautions / Restrictions Precautions Precautions: Fall Restrictions Weight Bearing Restrictions: No      Mobility  Bed Mobility Overal bed mobility: Needs Assistance Bed Mobility: Sit to Supine       Sit to supine: Min guard   General bed mobility comments: Min guard for safety to return to supine.   Transfers Overall transfer level: Needs assistance Equipment used: Rolling walker (2 wheeled) Transfers: Sit to/from Stand Sit to Stand: Min guard         General transfer comment: Pt standing in room upon entry attempting to go to bathroom. Educated about need for assist and assisted pt to [facility]. Min guard for safety to stand from [facility].    Ambulation/Gait Ambulation/Gait assistance: Min guard Gait Distance (Feet): 5 Feet Assistive device: Rolling walker (2 wheeled) Gait Pattern/deviations: Step-through pattern;Decreased stride length Gait velocity: Decreased   General Gait Details: Ambulated from [facility] back to bed at end of session. Performed forwards and backwards steps with min guard for safety. Pt refusing further ambulation as she reports she is tired. No overt LOB noted.   Stairs            Wheelchair Mobility    Modified Rankin (Stroke Patients Only)       Balance Overall balance assessment: Needs assistance Sitting-balance support: No upper extremity supported;Feet supported Sitting balance-Leahy Scale: Fair     Standing balance support: During functional activity;Bilateral upper extremity supported Standing balance-Leahy Scale: Poor Standing balance comment: Reliant on BUE support                              Pertinent Vitals/Pain Pain Assessment: Faces Faces Pain Scale: Hurts even more Pain Location: back, B knees   Pain Descriptors / Indicators: Aching Pain Intervention(s): Limited activity within patient's tolerance;Monitored during session;Repositioned    Home Living Family/patient expects to be discharged to:: Assisted living               Home Equipment: Gilford Rile - 2 wheels Additional Comments: Per daughter, pt from memory care ALF.     Prior Function Level of Independence: Needs assistance   Gait / Transfers Assistance Needed: Daughter reports pt was able to ambulate to dining hall with supervision. Daughter reports pt insistent  on ambulating vs using WC.   ADL's / Homemaking Assistance Needed: Pt's daughter reports she assists pt with some bathing tasks, however, pt prefers to be independent with bathing and dressing.         Hand Dominance        Extremity/Trunk Assessment   Upper Extremity Assessment Upper Extremity Assessment: Defer to OT evaluation     Lower Extremity Assessment Lower Extremity Assessment: Generalized weakness;RLE deficits/detail;LLE deficits/detail RLE Deficits / Details: Knee pain at baseline.  LLE Deficits / Details: Knee pain at baseline.     Cervical / Trunk Assessment Cervical / Trunk Assessment: Kyphotic  Communication   Communication: No difficulties  Cognition Arousal/Alertness: Awake/alert Behavior During Therapy: WFL for tasks assessed/performed Overall Cognitive Status: History of cognitive impairments - at baseline                                 General Comments: Per daughter, pt with frontotemporal dementia.       General Comments General comments (skin integrity, edema, etc.): Spoke with daughter prior to session and assisted with prior level of function information.     Exercises     Assessment/Plan    PT Assessment Patient needs continued PT services  PT Problem List Decreased strength;Decreased activity tolerance;Decreased balance;Decreased mobility;Decreased cognition;Decreased knowledge of use of DME;Decreased safety awareness;Decreased knowledge of precautions       PT Treatment Interventions DME instruction;Gait training;Functional mobility training;Therapeutic exercise;Therapeutic activities;Balance training;Cognitive remediation;Patient/family education    PT Goals (Current goals can be found in the Care Plan section)  Acute Rehab PT Goals Patient Stated Goal: for pt to return to memory care ALF at d/c  PT Goal Formulation: With family Time For Goal Achievement: 05/30/18 Potential to Achieve Goals: Fair    Frequency Min 3X/week   Barriers to discharge        Co-evaluation               AM-PAC PT "6 Clicks" Mobility  Outcome Measure Help needed turning from your back to your side while in a flat bed without using bedrails?: A Little Help needed moving from lying on your back to sitting on the side of a flat bed without using bedrails?: A Little Help  needed moving to and from a bed to a chair (including a wheelchair)?: A Little Help needed standing up from a chair using your arms (e.g., wheelchair or bedside chair)?: A Little Help needed to walk in hospital room?: A Little Help needed climbing 3-5 steps with a railing? : A Lot 6 Click Score: 17    End of Session   Activity Tolerance: Patient limited by fatigue Patient left: in bed;with call bell/phone within reach;with bed alarm set Nurse Communication: Mobility status PT Visit Diagnosis: Muscle weakness (generalized) (M62.81);Pain;Unsteadiness on feet (R26.81) Pain - Right/Left: (bilateral ) Pain - part of body: Knee(back )    Time: 3382-5053 PT Time Calculation (min) (ACUTE ONLY): 23 min   Charges:   PT Evaluation $PT Eval Moderate Complexity: 1 Mod PT Treatments $Gait Training: 8-22 mins        Leighton Ruff, PT, DPT  Acute Rehabilitation Services  Pager: [phone] Office: [phone]   Rudean Hitt 05/16/2018, 2:53 PM

## 2018-05-16 NOTE — Progress Notes (Signed)
  Date: 05/16/2018  Patient name: Daisy Parker  Medical record number: [id number]  Date of birth: [birth year]   I have seen and evaluated this patient and I have discussed the plan of care with the house staff. Please see Dr. Arta Silence note for complete details. I concur with her findings and plan.   [name] daughter was extremely helpful in providing context this morning.  It appears that [name] might not be getting adequate fluid at her memory care unit based on dietary restrictions.  We will hopefully be able to liberalize those and at least add glucerna shakes between meals.  She is slowly improving with fluids.  BC have been NGTD.  Her WBC, H/H and platelets have all decreased with fluids.  Her baseline is 10 - 11 based on care everywhere results from 2018, today 9.1.  PT when patient is able.   Sid Falcon, MD 05/16/2018, 1:44 PM

## 2018-05-16 NOTE — Progress Notes (Signed)
   Subjective: No overnight events. Patient was less agitated. Daughter at bedside this morning. She reports that patient has slept well, is eating well, seems happier, and is more lucid. She may have some constipation since she was straining a lot to go to the bathroom. Incontinence has been better and she is able to tell people when she needs to urinate. The daughter was able to talk with the patient and watch a basketball game together yesterday. Ms. Dabbs herself reports that she's feeling better today except she still feels weak. She was oriented to person, but not to place. She initially stated that she didn't know who her daughter was, but then laughed and said she was joking, "it's [name], of course."  The daughter believes that Ms. Bry was recently started on a new blood pressure medication. The medical team attempted to contact [facility] to confirm that hctz was the new medication, but was unable to speak with a healthcare provider.   Objective:  Vital signs in last 24 hours: Vitals:   05/15/18 1030 05/15/18 1717 05/15/18 2128 05/16/18 0606  BP: (!) 155/66 (!) 166/74 (!) 145/70 (!) 135/96  Pulse: 76 62 (!) 59 (!) 112  Resp: 18 18 18 18   Temp: 98.5 F (36.9 C) 98.6 F (37 C) 98.4 F (36.9 C) 98.4 F (36.9 C)  TempSrc: Oral Oral Oral Oral  SpO2: 99% 99% 99% 97%  Weight:    92.2 kg  Height:       General: Initially seen sleeping comfortably, wakes easily and is pleasant.  Cardiac: RRR, no murmurs Pulm: CTAB, normal effort on room air Abd: soft, non-distended, non-tender Ext: No edema   Assessment/Plan:  Principal Problem:   Encephalopathy acute Active Problems:   Dementia (HCC)   HTN (hypertension)   CKD (chronic kidney disease)  Ms. Grade is a 78 yo female with a medical history of hypothyroidism, HTN, DMII, HFpEF (EF >55%, grade III diastolic dysfunction on 0174 ECHO), CKDIII,duodenal perforation,and neurocognitive disorderwith frontotemporal  dementiawho presented from [facility].  Encephalopathy - Patient is usually alert and oriented at baseline. Per daughter's report, she is still altered but is becoming more lucid.  - Afebrile. Leukocytosis has resolved. No obvious source of infection. Blood cultures no growth x1 day. - Bradycardia has resolved. Troponin peaked at 0.15 yesterday. No ischemic changes on EKG. - Encephalopathy is most likely caused by dehydration in the setting of poor PO intake, glucosuria, and recent initiation of diuretic for antihypertensive therapy. Plan - Bedside sitter - F/u blood cultures  - Discontinue cefepime and vanc if blood cultures are still negative at 48 hrs (this afternoon) - Continue IVF - Continue home zyprexa 7.5mg  qhs - Avoid centrally acting meds  AKI on CKD - Improving. Cr 2.05 today, was 2.47 on admission (baseline is 1.5) - Likely prerenal 2/2 dehydration (poor PO intake and hctz prior to admission) Plan - IVF - Trend Cr - Discontinue home losartan-hctz - Glucerna TID between meals  - Advise memory care unit to allow Ms. Brisbin more frequent meals/snacks upon discharge  HTN - BP this am 135/96. Home regimen includes amlodipine 5mg  daily and losartan-hctz 50-12.5mg  daily. Plan - Hold amlodipine for bradycardia  - Discontinue home losartan-hctz for AKI  DMII - A1c 7.1. Patient does not take any medications for her diabetes. - Glucose within goal <180 Plan - A1c - SSI  Dispo: Anticipated discharge in approximately 1-2 days.  Deakon Frix, Andree Elk, MD 05/16/2018, 6:22 AM Pager: [phone]

## 2018-05-16 NOTE — Plan of Care (Signed)
  Problem: Education: Goal: Knowledge of General Education information will improve Description Including pain rating scale, medication(s)/side effects and non-pharmacologic comfort measures Outcome: Progressing Note:  POC reviewed with pt./daughter.

## 2018-05-16 NOTE — Progress Notes (Signed)
PT Cancellation Note  Patient Details Name: Daisy Parker MRN: [id number] DOB: 1941/04/01   Cancelled Treatment:    Reason Eval/Treat Not Completed: Other (comment) Pt's daughter present in room and requesting to hold PT today, as pt had rough night and was getting some sleep. Will follow up as schedule allows.   Leighton Ruff, PT, DPT  Acute Rehabilitation Services  Pager: [phone] Office: [phone]  Rudean Hitt 05/16/2018, 1:34 PM

## 2018-05-17 DIAGNOSIS — D649 Anemia, unspecified: Secondary | ICD-10-CM

## 2018-05-17 DIAGNOSIS — Z881 Allergy status to other antibiotic agents status: Secondary | ICD-10-CM

## 2018-05-17 DIAGNOSIS — E860 Dehydration: Secondary | ICD-10-CM

## 2018-05-17 DIAGNOSIS — Z885 Allergy status to narcotic agent status: Secondary | ICD-10-CM

## 2018-05-17 LAB — GLUCOSE, CAPILLARY
GLUCOSE-CAPILLARY: 152 mg/dL — AB (ref 70–99)
Glucose-Capillary: 105 mg/dL — ABNORMAL HIGH (ref 70–99)
Glucose-Capillary: 152 mg/dL — ABNORMAL HIGH (ref 70–99)

## 2018-05-17 LAB — BASIC METABOLIC PANEL
ANION GAP: 9 (ref 5–15)
BUN: 34 mg/dL — ABNORMAL HIGH (ref 8–23)
CHLORIDE: 110 mmol/L (ref 98–111)
CO2: 19 mmol/L — ABNORMAL LOW (ref 22–32)
Calcium: 8.8 mg/dL — ABNORMAL LOW (ref 8.9–10.3)
Creatinine, Ser: 1.79 mg/dL — ABNORMAL HIGH (ref 0.44–1.00)
GFR calc Af Amer: 31 mL/min — ABNORMAL LOW (ref >60)
GFR calc non Af Amer: 27 mL/min — ABNORMAL LOW (ref >60)
Glucose, Bld: 112 mg/dL — ABNORMAL HIGH (ref 70–99)
Potassium: 4.1 mmol/L (ref 3.5–5.1)
Sodium: 138 mmol/L (ref 135–145)

## 2018-05-17 MED ORDER — GLUCERNA SHAKE PO LIQD: 237.0000 mL | Freq: Three times a day (TID) | ORAL | 0 refills | Status: DC

## 2018-05-17 MED ORDER — POLYETHYLENE GLYCOL 3350 17 G PO PACK: 17.0000 g | PACK | Freq: Every day | ORAL | 0 refills | Status: DC

## 2018-05-17 NOTE — Progress Notes (Addendum)
   Subjective: No overnight events. No family at bedside this morning. Daisy Parker is sleeping soundly and difficult to arouse this morning. When asked if the team can examine her, she replied "that's okay." Spoke with her daughter yesterday about potential discharge back to her facility today. Daughter agreed. The daughter reported that the patient's PO intake was good yesterday. This morning's breakfast tray is empty.  Objective:  Vital signs in last 24 hours: Vitals:   05/16/18 0916 05/16/18 1817 05/16/18 2028 05/17/18 0451  BP: (!) 165/77 (!) 181/71 (!) 165/100 (!) 153/79  Pulse: (!) 54 71 93 72  Resp: 18 18  16   Temp: (!) 97.3 F (36.3 C) 98.2 F (36.8 C) 98.4 F (36.9 C) 98.2 F (36.8 C)  TempSrc: Oral Oral    SpO2: 100% 100% 97% 100%  Weight:   92 kg   Height:       Gen: Sleeping comfortably in bed, no distress CV: RRR, no murmurs Abd: soft, non-distended, non-tender Ext: no edema  Assessment/Plan:  Principal Problem:   Encephalopathy acute Active Problems:   Dementia (HCC)   HTN (hypertension)   CKD (chronic kidney disease)   AKI (acute kidney injury) ([place])  Daisy Parker is a 78 yo female with a medical history of hypothyroidism, HTN, DMII, HFpEF (EF >55%, grade III diastolic dysfunction on 9758 ECHO), CKDIII,duodenal perforation,and neurocognitive disorderwith frontotemporal dementiawho presented from [facility].  Encephalopathy - Patient is usually alert and oriented at baseline. Per daughter's report, she is still altered but is becoming more lucid.  -Afebrile. Leukocytosis has resolved.No obvious source of infection. Blood cultures no growth x2 days. Discontinued broad spectrum antibiotics after 48 hours. - Encephalopathy is most likely caused by dehydration in the setting of poor PO intake, glucosuria, and recent initiation of diuretic for antihypertensive therapy. Pt is tolerating PO intake well. - PT recommends discharge back to  ALF memory care unit with home health PT Plan - Discharge back to memory care unit -Discontinue IVF  - Continue home zyprexa 7.5mg  qhs - Avoid centrally acting meds  AKI on CKD -Improving.Cr1.79today, was2.47 on admission(baseline is 1.5) - Likely prerenal 2/2 dehydration (poor PO intake and hctz prior to admission) Plan - Discontinue IVF - Will need outpatient f/u of her kidney function - Discontinue home losartan-hctz - Glucerna TID between meals  - Advise memory care unit to allow Daisy Parker more frequent meals/snacks upon discharge  HTN - BPthis am 153/79.Home regimen includes amlodipine 5mg  daily and losartan-hctz 50-12.5mg  daily. Plan - Resume home amlodipine at discharge - Discontinue home losartan-hctz for AKI  DMII -A1c 7.1. Patient does not take any medications for her diabetes. - Glucosewithin goal <180 Plan - SSI  Anemia - Hb 9.1 yesterday. Baseline 10-11. Likely dilutional effect from IVF. - Outpatient f/u to reassess  Constipation - Miralax daily  Dispo: Anticipated discharge today.  Dorrell, Andree Elk, MD 05/17/2018, 6:47 AM Pager: [phone]

## 2018-05-17 NOTE — Clinical Social Work Note (Addendum)
Clinical Social Work Assessment  Patient Details  Name: Daisy Parker MRN: [id number] Date of Birth: 02-01-1941  Date of referral:  05/16/18               Reason for consult:  Discharge Planning                Permission sought to share information with:  Family Supports Permission granted to share information::  No(Patient oriented to self only)  Name::        Agency::     Relationship::     Contact Information:     Housing/Transportation Living arrangements for the past 2 months:  Assisted Living Facility([facility]) Source of Information:  Adult Children(Daughter [name]) Patient Interpreter Needed:  None Criminal Activity/Legal Involvement Pertinent to Current Situation/Hospitalization:  No - Comment as needed Significant Relationships:  Adult Children Lives with:  Self Do you feel safe going back to the place where you live?  Yes Need for family participation in patient care:  Yes (Comment)  Care giving concerns: Daughter [name] [phone]) reported no concerns regarding patient's care at [facility].  Social Worker assessment / plan:  CSW talked with daughter on 2/5 at the bedside regarding her mom's current living situation and d/c disposition. [name] was lying in bed and greeted CSW, however was quiet and at times napped during my conversation with her daughter. When asked, daughter reported that she is her mom's only child. "[name]" as she prefers to be called provided CSW with history of patient's move from [place] to [place] to live with her, the experience she and mom experienced at an ALF in [place] prior to moving her to [place]. Per daughter she is more satisfied with [facility] and is pleased that they communicate in a timely manner with her.  Daughter confirmed that her mom will return to [facility] at discharge.   Employment status:  Retired Forensic scientist:  Information systems manager, Medicaid In [place] PT Recommendations:   Home with Home Health(Back to ALF with [facility] services) Information / Referral to community resources:  Other (Comment Required)(None needed or requested as patient returning to ALF)  Patient/Family's Response to care:  Daughter expressed no concerns regarding patient's care during hospitalization.  Patient/Family's Understanding of and Emotional Response to Diagnosis, Current Treatment, and Prognosis:  Daughter expressed understanding of her mother's medical and mental health status and expressed no concerns regarding the course of care.  Emotional Assessment Appearance:  Appears stated age Attitude/Demeanor/Rapport:  Other(Quiet and napping during conversation with daughter) Affect (typically observed):  Quiet Orientation:  Oriented to Self Alcohol / Substance use:  Tobacco Use, Alcohol Use, Illicit Drugs(Per H&P patient quit smoking and does not drink or use illicit drugs) Psych involvement (Current and /or in the community):  No (Comment)  Discharge Needs  Concerns to be addressed:  Discharge Planning Concerns Readmission within the last 30 days:  No Current discharge risk:  None Barriers to Discharge:  No Barriers Identified   Sable Feil, LCSW 05/17/2018, 11:33 AM

## 2018-05-17 NOTE — Care Management Important Message (Signed)
Important Message  Patient Details  Name: Daisy Parker MRN: [id number] Date of Birth: 1940/11/30   Medicare Important Message Given:  Yes    Orbie Pyo 05/17/2018, 2:29 PM

## 2018-05-17 NOTE — Progress Notes (Signed)
  Date: 05/17/2018  Patient name: Daisy Parker  Medical record number: [id number]  Date of birth: 24-Aug-1940   I have seen and evaluated this patient and I have discussed the plan of care with the house staff. Please see Dr. Arta Silence note for complete details. I concur with her findings and plan.   Patient improved and ready for discharge.  Etiology of her confusion was related to AKI on CKD and dehydration.  She improved with fluids and encouraging PO intake.  She will need good access to water and other fluids and PO options that are too her liking to ensure this doesn't recur.   Sid Falcon, MD 05/17/2018, 2:00 PM

## 2018-05-17 NOTE — NC FL2 (Addendum)
[place] LEVEL OF CARE SCREENING TOOL     IDENTIFICATION  Patient Name: Daisy Parker Birthdate: December 22, 1940 Sex: female Admission Date (Current Location): 05/14/2018  [place] and [place] Number:  Kathleen Argue [id number] [place] and Address:  [facility] [place] [place] [address], [place], [place] [address]      Provider Number: [id number]  Attending Physician Name and Address:  Sid Falcon, MD  Relative Name and Phone Number:  Nemiah Bubar - daughter, [phone]    Current Level of Care:   Recommended Level of Care: Assisted Living Facility(From [facility]) Prior Approval Number:    Date Approved/Denied:   PASRR Number:    Discharge Plan: [facility]   Current Diagnoses: Patient Active Problem List   Diagnosis Date Noted  . AKI (acute kidney injury) ([place]) 05/16/2018  . Encephalopathy acute 05/14/2018  . Dementia ([place]) 05/14/2018  . HTN (hypertension) 05/14/2018  . CKD (chronic kidney disease) 05/14/2018    Orientation RESPIRATION BLADDER Height & Weight     Self  Normal Continent Weight: 202 lb 13.2 oz (92 kg) Height:  5\' 7"  (170.2 cm)  BEHAVIORAL SYMPTOMS/MOOD NEUROLOGICAL BOWEL NUTRITION STATUS      Continent Diet - Regular  AMBULATORY STATUS COMMUNICATION OF NEEDS Skin   Limited Assist(Min guard per PT) Verbally Skin abrasions, Other (Comment)(Skin tear left posterior hand; Ecchymosis right/left arm)                       Personal Care Assistance Level of Assistance  Bathing, Feeding, Dressing Bathing Assistance: Limited assistance Feeding assistance: Independent Dressing Assistance: Limited assistance     Functional Limitations Info  Sight, Hearing, Speech Sight Info: Impaired(Wears glasses) Hearing Info: Adequate Speech Info: Adequate    SPECIAL CARE FACTORS FREQUENCY  PT (By licensed PT)     PT Frequency: Evaluated 2/5 at hospital. PT at ALF Eval and Treat               Contractures Contractures Info: Not present    Additional Factors Info  Code Status, Allergies, Insulin Sliding Scale Code Status Info: Full Allergies Info: Codeine, Sulfa Antibiotics   Insulin Sliding Scale Info: 0-9 Units 3X per day with meals       Current Medications (05/17/2018):  This is the current hospital active medication list Current Facility-Administered Medications  Medication Dose Route Frequency Provider Last Rate Last Dose  . acetaminophen (TYLENOL) tablet 650 mg  650 mg Oral Q6H PRN Alphonzo Grieve, MD   650 mg at 05/15/18 2218   Or  . acetaminophen (TYLENOL) suppository 650 mg  650 mg Rectal Q6H PRN Alphonzo Grieve, MD      . allopurinol (ZYLOPRIM) tablet 100 mg  100 mg Oral Daily Alphonzo Grieve, MD   100 mg at 05/17/18 1016  . diclofenac sodium (VOLTAREN) 1 % transdermal gel 2 g  2 g Topical TID Alphonzo Grieve, MD   2 g at 05/17/18 1016  . feeding supplement (GLUCERNA SHAKE) (GLUCERNA SHAKE) liquid 237 mL  237 mL Oral TID BM Asencion Noble, MD   237 mL at 05/17/18 1016  . heparin injection 5,000 Units  5,000 Units Subcutaneous Q8H Alphonzo Grieve, MD   5,000 Units at 05/17/18 0546  . insulin aspart (novoLOG) injection 0-9 Units  0-9 Units Subcutaneous TID WC Alphonzo Grieve, MD   1 Units at 05/16/18 1648  . latanoprost (XALATAN) 0.005 % ophthalmic solution 1 drop  1 drop Both Eyes QHS [place],  Estill Dooms, MD   1 drop at 05/16/18 2223  . levothyroxine (SYNTHROID, LEVOTHROID) tablet 125 mcg  125 mcg Oral QAC breakfast Alphonzo Grieve, MD   125 mcg at 05/17/18 0834  . OLANZapine (ZYPREXA) tablet 7.5 mg  7.5 mg Oral QHS Gilles Chiquito B, MD   7.5 mg at 05/16/18 2219  . polyethylene glycol (MIRALAX / GLYCOLAX) packet 17 g  17 g Oral Daily Katherine Roan, MD   17 g at 05/17/18 1016  . polyvinyl alcohol (LIQUIFILM TEARS) 1.4 % ophthalmic solution 1 drop  1 drop Both Eyes TID Sid Falcon, MD   1 drop at 05/17/18 3794     Discharge Medications: Please see  discharge summary for a list of discharge medications.  Relevant Imaging Results:  Relevant Lab Results:   Additional Information DISCHARGE MEDICATIONS:  Medication List    STOP taking these medications   losartan-hydrochlorothiazide 50-12.5 MG tablet Commonly known as:  HYZAAR     TAKE these medications   acetaminophen 500 MG tablet Commonly known as:  TYLENOL Take 1,000 mg by mouth 3 (three) times daily. 0800, 1400, 2000.   allopurinol 100 MG tablet Commonly known as:  ZYLOPRIM Take 100 mg by mouth daily.   amLODipine 5 MG tablet Commonly known as:  NORVASC Take 5 mg by mouth every evening.   DAILY VITE MULTIVITAMIN/IRON Tabs Take 1 tablet by mouth daily.   diclofenac sodium 1 % Gel Commonly known as:  VOLTAREN Apply 2 g topically 3 (three) times daily. To knees, hips, and lower back.   feeding supplement (GLUCERNA SHAKE) Liqd Take 237 mLs by mouth 3 (three) times daily between meals.   fluticasone 50 MCG/ACT nasal spray Commonly known as:  FLONASE Place 1 spray into both nostrils daily.   hydroxypropyl methylcellulose / hypromellose 2.5 % ophthalmic solution Commonly known as:  ISOPTO TEARS / GONIOVISC Place 1 drop into both eyes 3 (three) times daily. 0800, 1400, 2000.   latanoprost 0.005 % ophthalmic solution Commonly known as:  XALATAN Place 1 drop into both eyes at bedtime.   levothyroxine 125 MCG tablet Commonly known as:  SYNTHROID, LEVOTHROID Take 125 mcg by mouth daily before breakfast.   OLANZapine 7.5 MG tablet Commonly known as:  ZYPREXA Take 7.5 mg by mouth at bedtime.   polyethylene glycol packet Commonly known as:  MIRALAX / GLYCOLAX Take 17 g by mouth daily.   Vitamin D 50 MCG (2000 UT) tablet Take 2,000 Units by mouth daily.   MIGHTY SHAKES  250 ml PO TID     Sable Feil, LCSW

## 2018-05-17 NOTE — Discharge Summary (Addendum)
Name: Daisy Parker MRN: [id number] DOB: 07-30-1940 78 y.o. PCP: Patient, No Pcp Per  Date of Admission: 05/14/2018  9:53 AM Date of Discharge: 05/17/2018 Attending Physician: Dr. Daryll Drown  Discharge Diagnosis: 1. Encephalopathy 2. AKI 3. HTN 4. Anemia 5. Constipation  Discharge Medications: Allergies as of 05/17/2018      Reactions   Codeine Anaphylaxis   Sulfa Antibiotics Anaphylaxis      Medication List    STOP taking these medications   losartan-hydrochlorothiazide 50-12.5 MG tablet Commonly known as:  HYZAAR     TAKE these medications   acetaminophen 500 MG tablet Commonly known as:  TYLENOL Take 1,000 mg by mouth 3 (three) times daily. 0800, 1400, 2000.   allopurinol 100 MG tablet Commonly known as:  ZYLOPRIM Take 100 mg by mouth daily.   amLODipine 5 MG tablet Commonly known as:  NORVASC Take 5 mg by mouth every evening.   DAILY VITE MULTIVITAMIN/IRON Tabs Take 1 tablet by mouth daily.   diclofenac sodium 1 % Gel Commonly known as:  VOLTAREN Apply 2 g topically 3 (three) times daily. To knees, hips, and lower back.   fluticasone 50 MCG/ACT nasal spray Commonly known as:  FLONASE Place 1 spray into both nostrils daily.   hydroxypropyl methylcellulose / hypromellose 2.5 % ophthalmic solution Commonly known as:  ISOPTO TEARS / GONIOVISC Place 1 drop into both eyes 3 (three) times daily. 0800, 1400, 2000.   latanoprost 0.005 % ophthalmic solution Commonly known as:  XALATAN Place 1 drop into both eyes at bedtime.   levothyroxine 125 MCG tablet Commonly known as:  SYNTHROID, LEVOTHROID Take 125 mcg by mouth daily before breakfast.   OLANZapine 7.5 MG tablet Commonly known as:  ZYPREXA Take 7.5 mg by mouth at bedtime.   polyethylene glycol packet Commonly known as:  MIRALAX / GLYCOLAX Take 17 g by mouth daily.   Vitamin D 50 MCG (2000 UT) tablet Take 2,000 Units by mouth daily.     MIGHTY SHAKES 262ml PO TID  Disposition and follow-up:     Ms.Daisy Parker was discharged from [facility] in Stable condition.  At the hospital follow up visit please address:  1.  AKI - Cr at discharge 1.7 (BL 1.5) - Please repeat BMP to reassess kidney function - Please liberalize her diet to include 3 meals + 3 snacks + mighty shakes TID - Encourage PO water/fluid intake as well  2. HTN - BP meds held during hospitalization - Resumed amlodipine on discharge - Discontinued losartan-hctz permanently 2/2 AKI - Please continue to monitor BP. Avoid diuretic agents.  3. Anemia - Hb 9.1 at discharge. BL 10-11. Possibly hemodilution in the setting of IVF. - Please repeat CBC at f/u to monitor Hb.  2.  Labs / imaging needed at time of follow-up: CBC, BMP  3.  Pending labs/ test needing follow-up: none  Follow-up Appointments:   Hospital Course by problem list: 1. Encephalopathy and AKI: Ms. Daisy Parker is a 78 yo female with a medical history of hypothyroidism, HTN, DMII, HFpEF (EF >55%, grade III diastolic dysfunction on 6387 ECHO), CKDIII,duodenal perforation,and neurocognitive disorderwith frontotemporal dementiawho presented from her memory care unit at [facility] for encephalopathy. She initially had a leukocytosis, but this quickly resolved. There were no other signs of infection. Normal CXR without PNA, normal UA, negative blood cx. The patient received 48 hours of broad spectrum abx before discontinuing. Cardiac workup was also unrevealing for ischemic events. Most likely etiology of encephalopathy was most likely  uremia and AKI 2/2 dehydration in the setting of poor PO intake, glucosuria, and recent initiation of diuretic for antihypertensive therapy. BL Cr is 1.5. Cr was 2.47 on admission. The patient was treated with IVF. Her losartan-hctz was discontinued. Her Cr improved to 1.79 on discharge. She was taking in PO well by discharge and her daughter reported that her mentation was close to baseline. Ms. Daisy Parker  diet at her ALF should be liberalized so that she can have 3 meals + 3 snacks + mighty shakes TID. Staff should also advise her to drink water. She will need a repeat BMP as an outpatient.  2. HTN: Held BP meds for bradycardia and AKI. Amlodipine was resumed on discharge. Discontinue losartan-hctz permanently 2/2 dehydration and AKI.  3. Anemia: Hb 9.1 at discharge. BL is 10-11. Decreased Hb likely dilutional. She will need outpatient f/u for CBC repeat.  4. Constipation: Patient's daughter reported straining while defecating. Patient has a history of constipation. Started on miralax daily.   Discharge Vitals:   BP (!) 149/80 (BP Location: Right Arm)   Pulse 79   Temp 98.1 F (36.7 C) (Oral)   Resp 18   Ht 5\' 7"  (1.702 m)   Wt 92 kg   SpO2 100%   BMI 31.77 kg/m   Pertinent Labs, Studies, and Procedures:  CBC Latest Ref Rng & Units 05/16/2018 05/15/2018 05/14/2018  WBC 4.0 - 10.5 K/uL 7.4 11.7(H) 16.1(H)  Hemoglobin 12.0 - 15.0 g/dL 9.1(L) 10.0(L) 12.0  Hematocrit 36.0 - 46.0 % 28.6(L) 31.2(L) 37.6  Platelets 150 - 400 K/uL 164 195 217   CMP Latest Ref Rng & Units 05/17/2018 05/16/2018 05/15/2018  Glucose 70 - 99 mg/dL 112(H) 114(H) 103(H)  BUN 8 - 23 mg/dL 34(H) 39(H) 46(H)  Creatinine 0.44 - 1.00 mg/dL 1.79(H) 2.05(H) 2.19(H)  Sodium 135 - 145 mmol/L 138 137 136  Potassium 3.5 - 5.1 mmol/L 4.1 4.0 4.1  Chloride 98 - 111 mmol/L 110 110 106  CO2 22 - 32 mmol/L 19(L) 19(L) 19(L)  Calcium 8.9 - 10.3 mg/dL 8.8(L) 8.4(L) 8.9  Total Protein 6.5 - 8.1 g/dL - - -  Total Bilirubin 0.3 - 1.2 mg/dL - - -  Alkaline Phos 38 - 126 U/L - - -  AST 15 - 41 U/L - - -  ALT 0 - 44 U/L - - -   CT head 05/14/2018 Atrophy and chronic microvascular ischemic change.  Discharge Instructions:   Signed: Corinne Ports, MD 05/17/2018, 12:57 PM   Pager: [phone]

## 2018-05-17 NOTE — Clinical Social Work Note (Signed)
Patient medically stable for discharge and will be returning to [facility] this evening, transported by ambulance. Signed FL-2 and discharge summary transmitted to facility and transport has been arranged. Called daughter [name] [phone]) and left message to inform her of today's discharge. CSW signing off as no other SW intervention services needed.  Teliah Buffalo Givens, MSW, LCSW Licensed Clinical Social Worker [place] [phone]

## 2018-05-17 NOTE — Progress Notes (Signed)
OT Cancellation Note  Patient Details Name: Daisy Parker MRN: [id number] DOB: [birth year]   Cancelled Treatment:    Reason Eval/Treat Not Completed: Other (comment)(Pt returned to baseline per MD, return to ALF). OT defer evaluation to home ALF facility as they are more aware of her baseline function and environment where she will be participating in meaningful occupation. Discharge orders signed. Should further concern arise, please feel free to order acutely.   Merri Ray Faithanne Verret 05/17/2018, 2:38 PM  Hulda Humphrey OTR/L Acute Rehabilitation Services Pager: [phone] Office: [phone]

## 2018-05-19 LAB — CULTURE, BLOOD (ROUTINE X 2)
CULTURE: NO GROWTH
Culture: NO GROWTH
Special Requests: ADEQUATE

## 2018-06-15 ENCOUNTER — Other Ambulatory Visit: Payer: Self-pay | Admitting: Internal Medicine

## 2019-03-08 ENCOUNTER — Encounter: Payer: Self-pay | Admitting: Physician Assistant

## 2019-03-08 ENCOUNTER — Inpatient Hospital Stay (HOSPITAL_COMMUNITY)
Admission: EM | Admit: 2019-03-08 | Discharge: 2019-03-14 | DRG: 243 | Disposition: A | Discharge status: Home / Self Care | Payer: Medicare Other | Source: Skilled Nursing Facility | Attending: Internal Medicine | Admitting: Internal Medicine

## 2019-03-08 ENCOUNTER — Encounter (HOSPITAL_COMMUNITY): Payer: Self-pay | Admitting: Emergency Medicine

## 2019-03-08 ENCOUNTER — Other Ambulatory Visit: Payer: Self-pay | Admitting: Physician Assistant

## 2019-03-08 ENCOUNTER — Observation Stay (HOSPITAL_COMMUNITY): Payer: Medicare Other

## 2019-03-08 ENCOUNTER — Other Ambulatory Visit: Payer: Self-pay

## 2019-03-08 ENCOUNTER — Emergency Department (HOSPITAL_COMMUNITY): Payer: Medicare Other

## 2019-03-08 ENCOUNTER — Observation Stay (HOSPITAL_BASED_OUTPATIENT_CLINIC_OR_DEPARTMENT_OTHER): Payer: Medicare Other

## 2019-03-08 DIAGNOSIS — R55 Syncope and collapse: Secondary | ICD-10-CM

## 2019-03-08 DIAGNOSIS — I080 Rheumatic disorders of both mitral and aortic valves: Secondary | ICD-10-CM | POA: Diagnosis present

## 2019-03-08 DIAGNOSIS — I441 Atrioventricular block, second degree: Principal | ICD-10-CM | POA: Diagnosis present

## 2019-03-08 DIAGNOSIS — E860 Dehydration: Secondary | ICD-10-CM | POA: Diagnosis present

## 2019-03-08 DIAGNOSIS — N179 Acute kidney failure, unspecified: Secondary | ICD-10-CM | POA: Diagnosis present

## 2019-03-08 DIAGNOSIS — Z7189 Other specified counseling: Secondary | ICD-10-CM

## 2019-03-08 DIAGNOSIS — F17290 Nicotine dependence, other tobacco product, uncomplicated: Secondary | ICD-10-CM | POA: Diagnosis present

## 2019-03-08 DIAGNOSIS — R627 Adult failure to thrive: Secondary | ICD-10-CM | POA: Diagnosis present

## 2019-03-08 DIAGNOSIS — J45909 Unspecified asthma, uncomplicated: Secondary | ICD-10-CM | POA: Diagnosis present

## 2019-03-08 DIAGNOSIS — Z515 Encounter for palliative care: Secondary | ICD-10-CM

## 2019-03-08 DIAGNOSIS — I130 Hypertensive heart and chronic kidney disease with heart failure and stage 1 through stage 4 chronic kidney disease, or unspecified chronic kidney disease: Secondary | ICD-10-CM | POA: Diagnosis present

## 2019-03-08 DIAGNOSIS — G3109 Other frontotemporal dementia: Secondary | ICD-10-CM | POA: Diagnosis present

## 2019-03-08 DIAGNOSIS — B9689 Other specified bacterial agents as the cause of diseases classified elsewhere: Secondary | ICD-10-CM | POA: Diagnosis present

## 2019-03-08 DIAGNOSIS — Z79899 Other long term (current) drug therapy: Secondary | ICD-10-CM

## 2019-03-08 DIAGNOSIS — Z6828 Body mass index (BMI) 28.0-28.9, adult: Secondary | ICD-10-CM

## 2019-03-08 DIAGNOSIS — I058 Other rheumatic mitral valve diseases: Secondary | ICD-10-CM | POA: Diagnosis present

## 2019-03-08 DIAGNOSIS — I5032 Chronic diastolic (congestive) heart failure: Secondary | ICD-10-CM | POA: Diagnosis present

## 2019-03-08 DIAGNOSIS — F0390 Unspecified dementia without behavioral disturbance: Secondary | ICD-10-CM | POA: Diagnosis present

## 2019-03-08 DIAGNOSIS — Z79890 Hormone replacement therapy (postmenopausal): Secondary | ICD-10-CM

## 2019-03-08 DIAGNOSIS — N390 Urinary tract infection, site not specified: Secondary | ICD-10-CM | POA: Diagnosis present

## 2019-03-08 DIAGNOSIS — W19XXXA Unspecified fall, initial encounter: Secondary | ICD-10-CM | POA: Diagnosis present

## 2019-03-08 DIAGNOSIS — R001 Bradycardia, unspecified: Secondary | ICD-10-CM

## 2019-03-08 DIAGNOSIS — N184 Chronic kidney disease, stage 4 (severe): Secondary | ICD-10-CM | POA: Diagnosis present

## 2019-03-08 DIAGNOSIS — H269 Unspecified cataract: Secondary | ICD-10-CM | POA: Diagnosis present

## 2019-03-08 DIAGNOSIS — I77810 Thoracic aortic ectasia: Secondary | ICD-10-CM | POA: Diagnosis present

## 2019-03-08 DIAGNOSIS — F0280 Dementia in other diseases classified elsewhere without behavioral disturbance: Secondary | ICD-10-CM | POA: Diagnosis present

## 2019-03-08 DIAGNOSIS — Z95818 Presence of other cardiac implants and grafts: Secondary | ICD-10-CM

## 2019-03-08 DIAGNOSIS — D631 Anemia in chronic kidney disease: Secondary | ICD-10-CM | POA: Diagnosis present

## 2019-03-08 DIAGNOSIS — B9620 Unspecified Escherichia coli [E. coli] as the cause of diseases classified elsewhere: Secondary | ICD-10-CM | POA: Diagnosis present

## 2019-03-08 DIAGNOSIS — Z882 Allergy status to sulfonamides status: Secondary | ICD-10-CM

## 2019-03-08 DIAGNOSIS — R350 Frequency of micturition: Secondary | ICD-10-CM

## 2019-03-08 DIAGNOSIS — E039 Hypothyroidism, unspecified: Secondary | ICD-10-CM | POA: Diagnosis present

## 2019-03-08 DIAGNOSIS — F05 Delirium due to known physiological condition: Secondary | ICD-10-CM | POA: Diagnosis present

## 2019-03-08 DIAGNOSIS — Z885 Allergy status to narcotic agent status: Secondary | ICD-10-CM

## 2019-03-08 DIAGNOSIS — Z20828 Contact with and (suspected) exposure to other viral communicable diseases: Secondary | ICD-10-CM | POA: Diagnosis present

## 2019-03-08 DIAGNOSIS — I444 Left anterior fascicular block: Secondary | ICD-10-CM | POA: Diagnosis present

## 2019-03-08 DIAGNOSIS — I4510 Unspecified right bundle-branch block: Secondary | ICD-10-CM | POA: Diagnosis present

## 2019-03-08 HISTORY — DX: Syncope and collapse: R55

## 2019-03-08 HISTORY — DX: Hypothyroidism, unspecified: E03.9

## 2019-03-08 HISTORY — DX: Unspecified cataract: H26.9

## 2019-03-08 HISTORY — DX: Bradycardia, unspecified: R00.1

## 2019-03-08 HISTORY — DX: Perforation of intestine (nontraumatic): K63.1

## 2019-03-08 HISTORY — DX: Anemia, unspecified: D64.9

## 2019-03-08 HISTORY — DX: Chronic kidney disease, stage 4 (severe): N18.4

## 2019-03-08 LAB — CBC WITH DIFFERENTIAL/PLATELET
Abs Immature Granulocytes: 0.04 10*3/uL (ref 0.00–0.07)
Basophils Absolute: 0 10*3/uL (ref 0.0–0.1)
Basophils Relative: 0 %
Eosinophils Absolute: 0.1 10*3/uL (ref 0.0–0.5)
Eosinophils Relative: 1 %
HCT: 34.9 % — ABNORMAL LOW (ref 36.0–46.0)
Hemoglobin: 11.1 g/dL — ABNORMAL LOW (ref 12.0–15.0)
Immature Granulocytes: 1 %
Lymphocytes Relative: 9 %
Lymphs Abs: 0.6 10*3/uL — ABNORMAL LOW (ref 0.7–4.0)
MCH: 29.6 pg (ref 26.0–34.0)
MCHC: 31.8 g/dL (ref 30.0–36.0)
MCV: 93.1 fL (ref 80.0–100.0)
Monocytes Absolute: 0.5 10*3/uL (ref 0.1–1.0)
Monocytes Relative: 8 %
Neutro Abs: 5.7 10*3/uL (ref 1.7–7.7)
Neutrophils Relative %: 81 %
Platelets: 174 10*3/uL (ref 150–400)
RBC: 3.75 MIL/uL — ABNORMAL LOW (ref 3.87–5.11)
RDW: 13.9 % (ref 11.5–15.5)
WBC: 7 10*3/uL (ref 4.0–10.5)
nRBC: 0 % (ref 0.0–0.2)

## 2019-03-08 LAB — BASIC METABOLIC PANEL
Anion gap: 13 (ref 5–15)
Anion gap: 13 (ref 5–15)
BUN: 45 mg/dL — ABNORMAL HIGH (ref 8–23)
BUN: 43 mg/dL — ABNORMAL HIGH (ref 8–23)
CO2: 20 mmol/L — ABNORMAL LOW (ref 22–32)
CO2: 21 mmol/L — ABNORMAL LOW (ref 22–32)
Calcium: 10 mg/dL (ref 8.9–10.3)
Calcium: 10 mg/dL (ref 8.9–10.3)
Chloride: 105 mmol/L (ref 98–111)
Chloride: 106 mmol/L (ref 98–111)
Creatinine, Ser: 2.81 mg/dL — ABNORMAL HIGH (ref 0.44–1.00)
Creatinine, Ser: 2.81 mg/dL — ABNORMAL HIGH (ref 0.44–1.00)
GFR calc Af Amer: 18 mL/min — ABNORMAL LOW (ref >60)
GFR calc Af Amer: 18 mL/min — ABNORMAL LOW (ref >60)
GFR calc non Af Amer: 15 mL/min — ABNORMAL LOW (ref >60)
GFR calc non Af Amer: 15 mL/min — ABNORMAL LOW (ref >60)
Glucose, Bld: 125 mg/dL — ABNORMAL HIGH (ref 70–99)
Glucose, Bld: 129 mg/dL — ABNORMAL HIGH (ref 70–99)
Potassium: 5 mmol/L (ref 3.5–5.1)
Potassium: 4.6 mmol/L (ref 3.5–5.1)
Sodium: 138 mmol/L (ref 135–145)
Sodium: 140 mmol/L (ref 135–145)

## 2019-03-08 LAB — TSH: TSH: 0.51 u[IU]/mL (ref 0.350–4.500)

## 2019-03-08 LAB — URINALYSIS, ROUTINE W REFLEX MICROSCOPIC
Bilirubin Urine: NEGATIVE
Glucose, UA: NEGATIVE mg/dL
Hgb urine dipstick: NEGATIVE
Ketones, ur: NEGATIVE mg/dL
Nitrite: NEGATIVE
Protein, ur: 100 mg/dL — AB
Specific Gravity, Urine: 1.016 (ref 1.005–1.030)
WBC, UA: >50 WBC/hpf — ABNORMAL HIGH (ref 0–5)
pH: 5 (ref 5.0–8.0)

## 2019-03-08 LAB — TROPONIN I (HIGH SENSITIVITY)
Troponin I (High Sensitivity): 27 ng/L — ABNORMAL HIGH (ref <18)
Troponin I (High Sensitivity): 28 ng/L — ABNORMAL HIGH (ref <18)

## 2019-03-08 LAB — SARS CORONAVIRUS 2 (TAT 6-24 HRS): SARS Coronavirus 2: NEGATIVE

## 2019-03-08 LAB — MRSA PCR SCREENING: MRSA by PCR: NEGATIVE

## 2019-03-08 LAB — ECHOCARDIOGRAM COMPLETE

## 2019-03-08 MED ORDER — POLYETHYLENE GLYCOL 3350 17 G PO PACK
17.0000 g | PACK | Freq: Every day | ORAL | Status: DC
  Administered 2019-03-08 – 2019-03-10 (×3): 17 g via ORAL
  Filled 2019-03-08 (×6): qty 1

## 2019-03-08 MED ORDER — HYPROMELLOSE (GONIOSCOPIC) 2.5 % OP SOLN: 1.0000 [drp] | Freq: Three times a day (TID) | OPHTHALMIC | Status: DC

## 2019-03-08 MED ORDER — DICLOFENAC SODIUM 1 % TD GEL
2.0000 g | Freq: Three times a day (TID) | TRANSDERMAL | Status: DC
  Administered 2019-03-08 – 2019-03-14 (×11): 2 g via TOPICAL
  Filled 2019-03-08 (×2): qty 100

## 2019-03-08 MED ORDER — SODIUM CHLORIDE 0.9 % IV BOLUS
500.0000 mL | Freq: Once | INTRAVENOUS | Status: AC
  Administered 2019-03-08: 500 mL via INTRAVENOUS

## 2019-03-08 MED ORDER — LATANOPROST 0.005 % OP SOLN
1.0000 [drp] | Freq: Every day | OPHTHALMIC | Status: DC
  Administered 2019-03-10 – 2019-03-13 (×4): 1 [drp] via OPHTHALMIC
  Filled 2019-03-08: qty 2.5

## 2019-03-08 MED ORDER — TAB-A-VITE/IRON PO TABS
1.0000 | ORAL_TABLET | Freq: Every day | ORAL | Status: DC
  Administered 2019-03-08 – 2019-03-14 (×7): 1 via ORAL
  Filled 2019-03-08 (×9): qty 1

## 2019-03-08 MED ORDER — LEVOTHYROXINE SODIUM 25 MCG PO TABS
125.0000 ug | ORAL_TABLET | Freq: Every day | ORAL | Status: DC
  Administered 2019-03-09 – 2019-03-13 (×5): 125 ug via ORAL
  Filled 2019-03-08 (×5): qty 1

## 2019-03-08 MED ORDER — SODIUM CHLORIDE 0.9 % IV SOLN
INTRAVENOUS | Status: DC
  Administered 2019-03-08 – 2019-03-09 (×2): via INTRAVENOUS

## 2019-03-08 MED ORDER — FLUTICASONE PROPIONATE 50 MCG/ACT NA SUSP
1.0000 | Freq: Every day | NASAL | Status: DC
  Administered 2019-03-08 – 2019-03-14 (×6): 1 via NASAL
  Filled 2019-03-08: qty 16

## 2019-03-08 MED ORDER — ENOXAPARIN SODIUM 30 MG/0.3ML ~~LOC~~ SOLN
30.0000 mg | SUBCUTANEOUS | Status: DC
  Administered 2019-03-08 – 2019-03-13 (×6): 30 mg via SUBCUTANEOUS
  Filled 2019-03-08 (×6): qty 0.3

## 2019-03-08 MED ORDER — VITAMIN D 25 MCG (1000 UNIT) PO TABS
2000.0000 [IU] | ORAL_TABLET | Freq: Every day | ORAL | Status: DC
  Administered 2019-03-08 – 2019-03-14 (×7): 2000 [IU] via ORAL
  Filled 2019-03-08 (×8): qty 2

## 2019-03-08 MED ORDER — HYPROMELLOSE (GONIOSCOPIC) 2.5 % OP SOLN
1.0000 [drp] | Freq: Three times a day (TID) | OPHTHALMIC | Status: DC
  Administered 2019-03-08 – 2019-03-14 (×15): 1 [drp] via OPHTHALMIC
  Filled 2019-03-08: qty 15

## 2019-03-08 MED ORDER — OLANZAPINE 5 MG PO TABS
7.5000 mg | ORAL_TABLET | Freq: Every day | ORAL | Status: DC
  Administered 2019-03-09 – 2019-03-13 (×5): 7.5 mg via ORAL
  Filled 2019-03-08 (×5): qty 2

## 2019-03-08 MED ORDER — LORAZEPAM 2 MG/ML IJ SOLN
2.0000 mg | Freq: Once | INTRAMUSCULAR | Status: AC
  Administered 2019-03-08: 2 mg via INTRAVENOUS
  Filled 2019-03-08: qty 1

## 2019-03-08 NOTE — Evaluation (Signed)
Physical Therapy Evaluation Patient Details Name: Daisy Parker MRN: [id number] DOB: 10-Jan-1941 Today's Date: 03/08/2019   History of Present Illness  78 year old female with past medical history of  CKD Stage III, frontal temporal dementia (diagnosed in 2018) ,HTN, hypothyroidism , HFpEF, cataracts, and history of syncope in 2017 who presents after episode of syncope at nursing facility.  Clinical Impression  Pt reports to PT after syncopal episode at ALF. Pt demonstrates no significant deficits at this time, ambulating at a supervision level and performing all mobility during session without an assistive device. Pt appears near her baseline level of mobility per daughter report during session, and has 24/7 supervision/assistance at ALF (memory care). Pt has no current acute PT needs. PT signing off.    Follow Up Recommendations No PT follow up;Supervision/Assistance - 24 hour    Equipment Recommendations  None recommended by PT(pt owns RW)    Recommendations for Other Services       Precautions / Restrictions Precautions Precautions: Fall Restrictions Weight Bearing Restrictions: No      Mobility  Bed Mobility Overal bed mobility: Independent                Transfers Overall transfer level: Needs assistance Equipment used: None Transfers: Sit to/from Stand Sit to Stand: Supervision;Min guard         General transfer comment: minG from low commode, otherwise supervision  Ambulation/Gait Ambulation/Gait assistance: Supervision Gait Distance (Feet): 20 Feet(pt declining longer distances) Assistive device: None Gait Pattern/deviations: Step-to pattern;Wide base of support Gait velocity: reduced Gait velocity interpretation: <1.8 ft/sec, indicate of risk for recurrent falls General Gait Details: step to gait with widened BOS, no significant balance deviations  Stairs            Wheelchair Mobility    Modified Rankin (Stroke Patients Only)        Balance Overall balance assessment: Needs assistance Sitting-balance support: No upper extremity supported;Feet supported Sitting balance-Leahy Scale: Good Sitting balance - Comments: supervision   Standing balance support: No upper extremity supported Standing balance-Leahy Scale: Good Standing balance comment: supervision                             Pertinent Vitals/Pain Pain Assessment: No/denies pain    Home Living Family/patient expects to be discharged to:: Assisted living               Home Equipment: Walker - 2 wheels Additional Comments: Per daughter, pt from memory care ALF.     Prior Function Level of Independence: Needs assistance   Gait / Transfers Assistance Needed: pt ambulates with RW and supervision per dtr. Pt participates in PT at ALF  ADL's / Homemaking Assistance Needed: Pt's daughter reports she assists pt with some bathing tasks, however, pt prefers to be independent with bathing and dressing.         Hand Dominance        Extremity/Trunk Assessment   Upper Extremity Assessment Upper Extremity Assessment: Overall WFL for tasks assessed    Lower Extremity Assessment Lower Extremity Assessment: Overall WFL for tasks assessed    Cervical / Trunk Assessment Cervical / Trunk Assessment: Kyphotic  Communication   Communication: HOH  Cognition Arousal/Alertness: Awake/alert Behavior During Therapy: WFL for tasks assessed/performed Overall Cognitive Status: History of cognitive impairments - at baseline  General Comments General comments (skin integrity, edema, etc.): VSS, BP 161/114 in standing, no orthostatic symptoms noted or reported    Exercises     Assessment/Plan    PT Assessment Patent does not need any further PT services  PT Problem List         PT Treatment Interventions      PT Goals (Current goals can be found in the Care Plan section)        Frequency     Barriers to discharge        Co-evaluation               AM-PAC PT "6 Clicks" Mobility  Outcome Measure Help needed turning from your back to your side while in a flat bed without using bedrails?: None Help needed moving from lying on your back to sitting on the side of a flat bed without using bedrails?: None Help needed moving to and from a bed to a chair (including a wheelchair)?: None Help needed standing up from a chair using your arms (e.g., wheelchair or bedside chair)?: None Help needed to walk in hospital room?: None Help needed climbing 3-5 steps with a railing? : A Little 6 Click Score: 23    End of Session Equipment Utilized During Treatment: (none) Activity Tolerance: Patient tolerated treatment well Patient left: in bed;with call bell/phone within reach;with bed alarm set;with family/visitor present Nurse Communication: Mobility status      Time: [id number] PT Time Calculation (min) (ACUTE ONLY): 25 min   Charges:   PT Evaluation $PT Eval Low Complexity: 1 Low          Zenaida Niece, PT, DPT Acute Rehabilitation Pager: [phone]   Zenaida Niece 03/08/2019, 4:36 PM

## 2019-03-08 NOTE — ED Notes (Signed)
Admitting at bedside 

## 2019-03-08 NOTE — Progress Notes (Addendum)
CRITICAL VALUE ALERT  Critical Value:  EKG 2nd Degree AV block Mobitz 1  Date & Time Notied:  03-08-19 1954  Provider Notified: Dr Sheppard Coil  Orders Received/Actions taken:

## 2019-03-08 NOTE — Progress Notes (Signed)
Patient voided on her own; therefore, did not need to I&O cath for urinalysis.

## 2019-03-08 NOTE — Progress Notes (Addendum)
   Sent following message to scheduling team: Hi scheduling, Dr. Johnsie Cancel fielded a call from the internal medicine team at [facility] on this patient. He requests a 30 day monitor and then followup with the EP team for syncope. Does not look like she has seen cardiology before. I put order in for monitor - needs to be the live active telemetry kind. Please call pt with information.  Addendum:  Our team has since seen patient in consult and formal EP consult is pending in the hospital. Will hold off off on monitor request pending their input. If monitor is felt needed, a repeat request/order will need to be sent.  Raudel Bazen PA-C

## 2019-03-08 NOTE — ED Provider Notes (Signed)
[facility] EMERGENCY DEPARTMENT Provider Note   CSN: [id number] Arrival date & time: 03/08/19  [id number]     History   Chief Complaint Chief Complaint  Patient presents with  . Loss of Consciousness    HPI Daisy Parker is a 78 y.o. female.     78 year old female brought in by EMS from nursing facility.  Patient is a past medical history of dementia and hypertension, states that she has not been feeling well lately, reports vague lower abdominal discomfort with fatigue, per nursing home staff, patient had a syncopal episode today and was assisted to the ground, no injuries.  Patient is not anticoagulated.  Patient denies changes in bowel or bladder habits, denies blood in stool.  No other complaints or concerns.     Past Medical History:  Diagnosis Date  . Dementia ([place])   . Hypertension     Patient Active Problem List   Diagnosis Date Noted  . Syncope 03/08/2019  . AKI (acute kidney injury) ([place]) 05/16/2018  . Encephalopathy acute 05/14/2018  . Dementia ([place]) 05/14/2018  . HTN (hypertension) 05/14/2018  . CKD (chronic kidney disease) 05/14/2018    History reviewed. No pertinent surgical history.   OB History   No obstetric history on file.      Home Medications    Prior to Admission medications   Medication Sig Start Date End Date Taking? Authorizing Provider  acetaminophen (TYLENOL) 500 MG tablet Take 1,000 mg by mouth 3 (three) times daily. 0800, 1400, 2000.    [provider]  allopurinol (ZYLOPRIM) 100 MG tablet Take 100 mg by mouth daily.    [provider]  amLODipine (NORVASC) 5 MG tablet Take 5 mg by mouth every evening.    [provider]  Cholecalciferol (VITAMIN D) 50 MCG (2000 UT) tablet Take 2,000 Units by mouth daily.    [provider]  diclofenac sodium (VOLTAREN) 1 % GEL Apply 2 g topically 3 (three) times daily. To knees, hips, and lower back.    [provider]  fluticasone  (FLONASE) 50 MCG/ACT nasal spray Place 1 spray into both nostrils daily.    [provider]  hydroxypropyl methylcellulose / hypromellose (ISOPTO TEARS / GONIOVISC) 2.5 % ophthalmic solution Place 1 drop into both eyes 3 (three) times daily. 0800, 1400, 2000.    [provider]  latanoprost (XALATAN) 0.005 % ophthalmic solution Place 1 drop into both eyes at bedtime.    [provider]  levothyroxine (SYNTHROID, LEVOTHROID) 125 MCG tablet Take 125 mcg by mouth daily before breakfast.    [provider]  Multiple Vitamins-Iron (DAILY VITE MULTIVITAMIN/IRON) TABS Take 1 tablet by mouth daily.    [provider]  OLANZapine (ZYPREXA) 7.5 MG tablet Take 7.5 mg by mouth at bedtime.    [provider]  polyethylene glycol (MIRALAX / GLYCOLAX) packet Take 17 g by mouth daily. 05/17/18   Dorrell, Andree Elk, MD    Family History No family history on file.  Social History Social History   Tobacco Use  . Smoking status: Former Research scientist (life sciences)  . Smokeless tobacco: Current User  Substance Use Topics  . Alcohol use: Never    Frequency: Never  . Drug use: Never     Allergies   Codeine and Sulfa antibiotics   Review of Systems Review of Systems  Unable to perform ROS: Dementia  Constitutional: Positive for fatigue.  Respiratory: Negative for shortness of breath.   Cardiovascular: Negative for chest pain.  Gastrointestinal:  Positive for abdominal pain. Negative for constipation.  Genitourinary: Negative for dysuria.     Physical Exam Updated Vital Signs BP (!) 127/97   Pulse 60   Temp 98 F (36.7 C) (Oral)   Resp 15   SpO2 97%   Physical Exam Vitals signs and nursing note reviewed.  Constitutional:      General: She is not in acute distress.    Appearance: She is well-developed. She is not diaphoretic.  HENT:     Head: Normocephalic and atraumatic.  Eyes:     Extraocular Movements: Extraocular movements intact.     Pupils: Pupils  are equal, round, and reactive to light.  Neck:     Musculoskeletal: Normal range of motion and neck supple.  Cardiovascular:     Rate and Rhythm: Bradycardia present. Rhythm irregular.     Pulses: Normal pulses.     Heart sounds: Normal heart sounds.  Pulmonary:     Effort: Pulmonary effort is normal.     Breath sounds: Normal breath sounds.  Abdominal:     Palpations: Abdomen is soft.     Tenderness: There is abdominal tenderness in the right lower quadrant, suprapubic area and left lower quadrant.  Musculoskeletal:        General: No swelling, tenderness or deformity.     Right lower leg: No edema.     Left lower leg: No edema.  Skin:    General: Skin is warm and dry.     Findings: No erythema or rash.  Neurological:     Mental Status: She is alert. Mental status is at baseline.     Comments: Baseline per EMS/nursing facility. Alert to person and place  Psychiatric:        Behavior: Behavior normal.      ED Treatments / Results  Labs (all labs ordered are listed, but only abnormal results are displayed) Labs Reviewed  BASIC METABOLIC PANEL - Abnormal; Notable for the following components:      Result Value   CO2 20 (*)    Glucose, Bld 125 (*)    BUN 45 (*)    Creatinine, Ser 2.81 (*)    GFR calc non Af Amer 15 (*)    GFR calc Af Amer 18 (*)    All other components within normal limits  CBC WITH DIFFERENTIAL/PLATELET - Abnormal; Notable for the following components:   RBC 3.75 (*)    Hemoglobin 11.1 (*)    HCT 34.9 (*)    Lymphs Abs 0.6 (*)    All other components within normal limits  TROPONIN I (HIGH SENSITIVITY) - Abnormal; Notable for the following components:   Troponin I (High Sensitivity) 27 (*)    All other components within normal limits  TROPONIN I (HIGH SENSITIVITY) - Abnormal; Notable for the following components:   Troponin I (High Sensitivity) 28 (*)    All other components within normal limits  SARS CORONAVIRUS 2 (TAT 6-24 HRS)  URINALYSIS,  ROUTINE W REFLEX MICROSCOPIC  BASIC METABOLIC PANEL  TSH    EKG EKG Interpretation  Date/Time:  Friday March 08 2019 10:00:52 EST Ventricular Rate:  67 PR Interval:    QRS Duration: 160 QT Interval:  438 QTC Calculation: 463 R Axis:   -82 Text Interpretation: 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 No signs of consolidation, pleural effusion or interstitial thickening. No acute bone finding. IMPRESSION: Signs of cardiomegaly without acute cardiopulmonary disease. Electronically Signed   By: Zetta Bills M.D.   On: 03/08/2019 10:47    Procedures .Critical Care Performed by: Tacy Learn, PA-C Authorized by: Tacy Learn, PA-C   Critical care provider statement:    Critical care time (minutes):  45   Critical care was time spent personally by me on the following activities:  Discussions with consultants, evaluation of patient's response to treatment, examination of patient, ordering and performing treatments and interventions, ordering and review of laboratory studies, ordering and review of radiographic studies, pulse oximetry, re-evaluation of patient's condition, obtaining history from patient or surrogate and review of old charts   (including critical care time)  Medications Ordered in ED Medications  OLANZapine (ZYPREXA) tablet 7.5 mg (has no administration in time range)  levothyroxine (SYNTHROID) tablet 125 mcg (has no administration in time range)  polyethylene glycol (MIRALAX / GLYCOLAX) packet 17 g (has no administration in time range)  Vitamin D 2,000 Units (has no administration in time range)  Daily Vite Multivitamin/Iron TABS 1 tablet (has no administration in  time range)  fluticasone (FLONASE) 50 MCG/ACT nasal spray 1 spray (has no administration in time range)  diclofenac sodium (VOLTAREN) 1 % transdermal gel 2 g (has no administration in time range)  hydroxypropyl methylcellulose / hypromellose (ISOPTO TEARS / GONIOVISC) 2.5 % ophthalmic solution 1 drop (has no administration in time range)  latanoprost (XALATAN) 0.005 % ophthalmic solution 1 drop (has no administration in time range)  enoxaparin (LOVENOX) injection 30 mg (has no administration in time range)  sodium chloride 0.9 % bolus 500 mL (500 mLs Intravenous New Bag/Given 03/08/19 1215)     Initial Impression / Assessment and Plan / ED Course  I have reviewed the triage vital signs and the nursing notes.  Pertinent labs & imaging results that were available during my care of the patient were reviewed by me and considered in my medical decision making (see chart for details).  Clinical Course as of Mar 07 1325  Fri Mar 08, 5231  6832 79 year old female from nursing home for syncopal episode today.  Past medical history of dementia, hypothyroid and hypertension, patient complains of fatigue and lower abdominal discomfort without changes in bowel or bladder habits, patient does not recall her syncopal episode today.  Case discussed with Dr. Ashok Cordia, ER attending, syncopal episode with variable AV block on EKG, plan is to consult for admission after work-up is complete.   [LM]  1323 Review of labs, CBC without significant changes, BMP with increase in Cr to 2.81 (baseline per 05/2018 admission 1.5). UA pending. Troponin elevated at 28, delta trop ordered and pending, suspect due to AKI.  Case discussed with hospitalist who will consult for admission. Daughter now at bedside, knowledgeable in patient's medical history. Reports prior admission to [facility] in 2018, seen by cardiology at that time who wanted to do an event monitor but patient could not tolerate the leads. Also adds patient is a diabetic  and history of CKD3.    [LM]    Clinical Course User Index [LM] Tacy Learn, PA-C      Final Clinical Impressions(s) / ED Diagnoses   Final diagnoses:  Syncope, unspecified syncope type  AKI (acute kidney injury) [facility])    ED Discharge Orders    None  Tacy Learn, PA-C 03/08/19 1326    Lajean Saver, MD 03/08/19 [phone]

## 2019-03-08 NOTE — ED Triage Notes (Signed)
Per [facility] pt coming from [facility] home after having a witnessed syncopal episode in front of staff. States patient was lowered to ground and did not hit head. Per staff pt is at her baseline. orientated to self.

## 2019-03-08 NOTE — H&P (Signed)
Date: 03/08/2019               Patient Name:  Daisy Parker MRN: [id number]  DOB: [birth year] Age / Sex: 78 y.o., female   PCP: Patient, No Pcp Per         Medical Service: Internal Medicine Teaching Service         Attending Physician: Dr. Evette Doffing, Mallie Mussel, *    First Contact: Dr. Court Joy Pager: [phone]  Second Contact: Dr. Koleen Distance Pager: [phone]       After Hours (After 5p/  First Contact Pager: [phone]  weekends / holidays): Second Contact Pager: [phone]   Chief Complaint: Syncope  History of Present Illness: Daisy Parker is a 78 year old female with past medical history of  CKD Stage III, frontal temporal dementia (diagnosed in 2018) ,HTN, hypothyroidism , HFpEF, cataracts, and history of syncope in 2017 who presents after episode of syncope at nursing facility. Patient is poor historian due to FTD, however she can remember the event.  She says she feels like she was going to pass out. Patient reports she has had a hard time urinating for the past two week, and for the past few days it burns with urination. In addition she has had loose stools off and on for 2 weeks. Denies any fever, sob, chest pain, abdominal pain, or vertigo.  Reached out to [facility], but was unable to reach anyone yet. Per ED note patient was assisted to the ground without injury. Not clear if patient lost consciousness. No report of seizure activity or post ictal state.     Meds:  No current facility-administered medications on file prior to encounter.    Current Outpatient Medications on File Prior to Encounter  Medication Sig Dispense Refill  . acetaminophen (TYLENOL) 500 MG tablet Take 1,000 mg by mouth 3 (three) times daily. 0800, 1400, 2000.    Marland Kitchen allopurinol (ZYLOPRIM) 100 MG tablet Take 100 mg by mouth daily.    Marland Kitchen amLODipine (NORVASC) 5 MG tablet Take 5 mg by mouth every evening.    . Cholecalciferol (VITAMIN D) 50 MCG (2000 UT) tablet Take 2,000 Units by mouth daily.    .  diclofenac sodium (VOLTAREN) 1 % GEL Apply 2 g topically 3 (three) times daily. To knees, hips, and lower back.    . fluticasone (FLONASE) 50 MCG/ACT nasal spray Place 1 spray into both nostrils daily.    . hydroxypropyl methylcellulose / hypromellose (ISOPTO TEARS / GONIOVISC) 2.5 % ophthalmic solution Place 1 drop into both eyes 3 (three) times daily. 0800, 1400, 2000.    Marland Kitchen latanoprost (XALATAN) 0.005 % ophthalmic solution Place 1 drop into both eyes at bedtime.    Marland Kitchen levothyroxine (SYNTHROID, LEVOTHROID) 125 MCG tablet Take 125 mcg by mouth daily before breakfast.    . Multiple Vitamins-Iron (DAILY VITE MULTIVITAMIN/IRON) TABS Take 1 tablet by mouth daily.    Marland Kitchen OLANZapine (ZYPREXA) 7.5 MG tablet Take 7.5 mg by mouth at bedtime.    . polyethylene glycol (MIRALAX / GLYCOLAX) packet Take 17 g by mouth daily. 14 each 0    No outpatient medications have been marked as taking for the 03/08/19 encounter [facility] Encounter).     Allergies: Allergies as of 03/08/2019 - Review Complete 03/08/2019  Allergen Reaction Noted  . Codeine Anaphylaxis 05/14/2018  . Sulfa antibiotics Anaphylaxis 05/14/2018   Past Medical History:  Diagnosis Date  . Dementia ([place])   . Hypertension     Family History: No pertinent family history.  Social History:  Social History   Tobacco Use  . Smoking status: Former Research scientist (life sciences)  . Smokeless tobacco: Current User  Substance Use Topics  . Alcohol use: Never    Frequency: Never  . Drug use: Never     Review of Systems: A complete ROS was negative except as per HPI.   Physical Exam: Blood pressure (!) 127/97, pulse 60, temperature 98 F (36.7 C), temperature source Oral, resp. rate 15, SpO2 97 %.  General: Frail , elderly female HEENT: Poor dentition, moist mucous membranes Cardiovascular: Bradycardic, regular rhythm. No murmurs Pulmonary: Clear to auscultation bilaterally no wheezes rales or rhonchi Abdominal: Nontender , bowel sounds present Ext:  Band-Aid on dorsal side of left hand from scratch, no deformities Skin: Warm and dry Neuro: oriented to self, partially to place ( hospital, not sure where), situation, but not to time. ( year 2000) . No gross focal deficits.   EKG: personally reviewed my interpretation is sinus rhythm with variable AV block ,right bundle branch block and left anterior fascicular block  CXR: personally reviewed my interpretation is cardiomegaly. No focal opacities or pleural effusions.  Assessment & Plan by Problem: Active Problems:   Syncope  Daisy Parker is a 78 year old female with past medical history of  CKD Stage III, frontal temporal dementia (diagnosed in 2018) ,HTN, hypothyroidism , HFpEF, cataracts, and history of syncope in 2017 who presents after episode of syncope at nursing facility.   Bradycardia #witnessed syncopal event  -Patient presented with encephalopathy back in February found to be bradycardic.  Losartan and HCTZ was discontinued at this time.  Patient was given fluids and nutrition with improvement.   -Patient reports diarrhea and poor p.o. intake , receiving fluids in ED.  Could be multifactorial. Vasovagal syncope described (prodromal experience) , could be caused by cystocerebral syndrome. Bladder scan and U/A. Patient normotensive , but volume down on presentation. Will give fluids and check orthostatics. Patient not on any beta blocker. Holding amlodipine. EKG show variable AV block, first degree AV block seen on last exam.  Will try to piece out with following test. [facility] who recommended following up Echo and possible halter monitor.  -Echo, last one in 2018 for structural defects - Bladder Scan - Follow up U/A - Orthostatics  #AKI on CKD Stage 3 - (baseline ~1.5)  2.8 on admission; most likely pre-renal; will monitor for improvement on IVF   #HTN  - Normotensive - holding amlodipine   #Hypothyroidism   Appears she has not gotten synthroid since 11/9;  checking TSH; continued prescribed dose for now    Dispo: Admit patient to Observation with expected length of stay less than 2 midnights.  Signed: Tamsen Snider, MD PGY1  [phone]

## 2019-03-08 NOTE — Progress Notes (Signed)
  Echocardiogram 2D Echocardiogram has been performed.  Daisy Parker 03/08/2019, 3:53 PM

## 2019-03-09 ENCOUNTER — Observation Stay (HOSPITAL_COMMUNITY): Payer: Medicare Other

## 2019-03-09 ENCOUNTER — Encounter (HOSPITAL_COMMUNITY): Payer: Self-pay | Admitting: Physician Assistant

## 2019-03-09 DIAGNOSIS — Z882 Allergy status to sulfonamides status: Secondary | ICD-10-CM | POA: Diagnosis not present

## 2019-03-09 DIAGNOSIS — I5032 Chronic diastolic (congestive) heart failure: Secondary | ICD-10-CM | POA: Diagnosis not present

## 2019-03-09 DIAGNOSIS — I058 Other rheumatic mitral valve diseases: Secondary | ICD-10-CM | POA: Diagnosis not present

## 2019-03-09 DIAGNOSIS — I441 Atrioventricular block, second degree: Secondary | ICD-10-CM | POA: Diagnosis not present

## 2019-03-09 DIAGNOSIS — I5030 Unspecified diastolic (congestive) heart failure: Secondary | ICD-10-CM | POA: Diagnosis not present

## 2019-03-09 DIAGNOSIS — N179 Acute kidney failure, unspecified: Secondary | ICD-10-CM

## 2019-03-09 DIAGNOSIS — Z7189 Other specified counseling: Secondary | ICD-10-CM | POA: Diagnosis not present

## 2019-03-09 DIAGNOSIS — N184 Chronic kidney disease, stage 4 (severe): Secondary | ICD-10-CM | POA: Diagnosis not present

## 2019-03-09 DIAGNOSIS — G3109 Other frontotemporal dementia: Secondary | ICD-10-CM | POA: Diagnosis present

## 2019-03-09 DIAGNOSIS — Z6828 Body mass index (BMI) 28.0-28.9, adult: Secondary | ICD-10-CM | POA: Diagnosis not present

## 2019-03-09 DIAGNOSIS — F0280 Dementia in other diseases classified elsewhere without behavioral disturbance: Secondary | ICD-10-CM | POA: Diagnosis present

## 2019-03-09 DIAGNOSIS — D631 Anemia in chronic kidney disease: Secondary | ICD-10-CM | POA: Diagnosis present

## 2019-03-09 DIAGNOSIS — I4510 Unspecified right bundle-branch block: Secondary | ICD-10-CM | POA: Diagnosis not present

## 2019-03-09 DIAGNOSIS — F0391 Unspecified dementia with behavioral disturbance: Secondary | ICD-10-CM | POA: Diagnosis not present

## 2019-03-09 DIAGNOSIS — R001 Bradycardia, unspecified: Secondary | ICD-10-CM | POA: Diagnosis not present

## 2019-03-09 DIAGNOSIS — B9689 Other specified bacterial agents as the cause of diseases classified elsewhere: Secondary | ICD-10-CM | POA: Diagnosis not present

## 2019-03-09 DIAGNOSIS — R627 Adult failure to thrive: Secondary | ICD-10-CM | POA: Diagnosis present

## 2019-03-09 DIAGNOSIS — R55 Syncope and collapse: Secondary | ICD-10-CM

## 2019-03-09 DIAGNOSIS — B9620 Unspecified Escherichia coli [E. coli] as the cause of diseases classified elsewhere: Secondary | ICD-10-CM | POA: Diagnosis not present

## 2019-03-09 DIAGNOSIS — F17290 Nicotine dependence, other tobacco product, uncomplicated: Secondary | ICD-10-CM | POA: Diagnosis present

## 2019-03-09 DIAGNOSIS — Z515 Encounter for palliative care: Secondary | ICD-10-CM | POA: Diagnosis not present

## 2019-03-09 DIAGNOSIS — Z79890 Hormone replacement therapy (postmenopausal): Secondary | ICD-10-CM | POA: Diagnosis not present

## 2019-03-09 DIAGNOSIS — F05 Delirium due to known physiological condition: Secondary | ICD-10-CM | POA: Diagnosis not present

## 2019-03-09 DIAGNOSIS — W19XXXA Unspecified fall, initial encounter: Secondary | ICD-10-CM | POA: Diagnosis present

## 2019-03-09 DIAGNOSIS — N390 Urinary tract infection, site not specified: Secondary | ICD-10-CM | POA: Diagnosis not present

## 2019-03-09 DIAGNOSIS — Z885 Allergy status to narcotic agent status: Secondary | ICD-10-CM | POA: Diagnosis not present

## 2019-03-09 DIAGNOSIS — I444 Left anterior fascicular block: Secondary | ICD-10-CM | POA: Diagnosis present

## 2019-03-09 DIAGNOSIS — I130 Hypertensive heart and chronic kidney disease with heart failure and stage 1 through stage 4 chronic kidney disease, or unspecified chronic kidney disease: Secondary | ICD-10-CM | POA: Diagnosis not present

## 2019-03-09 DIAGNOSIS — Z79899 Other long term (current) drug therapy: Secondary | ICD-10-CM | POA: Diagnosis not present

## 2019-03-09 DIAGNOSIS — E039 Hypothyroidism, unspecified: Secondary | ICD-10-CM | POA: Diagnosis present

## 2019-03-09 DIAGNOSIS — Z20828 Contact with and (suspected) exposure to other viral communicable diseases: Secondary | ICD-10-CM | POA: Diagnosis not present

## 2019-03-09 LAB — BASIC METABOLIC PANEL
Anion gap: 11 (ref 5–15)
BUN: 43 mg/dL — ABNORMAL HIGH (ref 8–23)
CO2: 21 mmol/L — ABNORMAL LOW (ref 22–32)
Calcium: 9.6 mg/dL (ref 8.9–10.3)
Chloride: 108 mmol/L (ref 98–111)
Creatinine, Ser: 2.79 mg/dL — ABNORMAL HIGH (ref 0.44–1.00)
GFR calc Af Amer: 18 mL/min — ABNORMAL LOW (ref >60)
GFR calc non Af Amer: 15 mL/min — ABNORMAL LOW (ref >60)
Glucose, Bld: 98 mg/dL (ref 70–99)
Potassium: 4.5 mmol/L (ref 3.5–5.1)
Sodium: 140 mmol/L (ref 135–145)

## 2019-03-09 LAB — CBC
HCT: 33.8 % — ABNORMAL LOW (ref 36.0–46.0)
Hemoglobin: 11 g/dL — ABNORMAL LOW (ref 12.0–15.0)
MCH: 30.2 pg (ref 26.0–34.0)
MCHC: 32.5 g/dL (ref 30.0–36.0)
MCV: 92.9 fL (ref 80.0–100.0)
Platelets: 178 10*3/uL (ref 150–400)
RBC: 3.64 MIL/uL — ABNORMAL LOW (ref 3.87–5.11)
RDW: 13.9 % (ref 11.5–15.5)
WBC: 6.6 10*3/uL (ref 4.0–10.5)
nRBC: 0 % (ref 0.0–0.2)

## 2019-03-09 LAB — SODIUM, URINE, RANDOM: Sodium, Ur: 54 mmol/L

## 2019-03-09 LAB — CREATININE, URINE, RANDOM: Creatinine, Urine: 101.9 mg/dL

## 2019-03-09 MED ORDER — SODIUM CHLORIDE 0.9 % IV SOLN
1.0000 g | INTRAVENOUS | Status: AC
  Administered 2019-03-09 – 2019-03-11 (×3): 1 g via INTRAVENOUS
  Filled 2019-03-09 (×3): qty 10

## 2019-03-09 MED ORDER — AMLODIPINE BESYLATE 5 MG PO TABS
5.0000 mg | ORAL_TABLET | Freq: Once | ORAL | Status: AC
  Administered 2019-03-09: 5 mg via ORAL
  Filled 2019-03-09: qty 1

## 2019-03-09 MED ORDER — SODIUM CHLORIDE 0.9 % IV SOLN
INTRAVENOUS | Status: AC
  Administered 2019-03-09 – 2019-03-10 (×3): via INTRAVENOUS

## 2019-03-09 NOTE — Progress Notes (Signed)
.  OT Cancellation Note  Patient Details Name: Daisy Parker MRN: [id number] DOB: 04-15-1940   Cancelled Treatment:    Reason Eval/Treat Not Completed: Other (comment). Another MD in room speaking with pt's dtr. Will continue to try for eval as schedule allows.  [place], OTR/L Acute Rehab Services Pager [phone] Office [phone]     Almon Register 03/09/2019, 11:59 AM

## 2019-03-09 NOTE — Progress Notes (Signed)
Chaplain visited with the daughter to discuss decision making in regard to the patients health.  The daughter expressed some reticence about making the decision because of having to make a similar decision when her father died.  The chaplain encouraged the daughter to discuss with palliative care for more information concerning their decision.  The chaplain will follow-up if they are needed.  Brion Aliment Chaplain Resident For questions concerning this note please contact me by pager [phone]

## 2019-03-09 NOTE — Consult Note (Addendum)
Cardiology Consultation:   Patient ID: Daisy Parker; [id number]; 1940/09/14   Admit date: 03/08/2019 Date of Consult: 03/09/2019  Primary Care Provider: Patient, No Pcp Per Primary Cardiologist: [name], MD (new) Primary Electrophysiologist:  None  Chief Complaint: syncope  Patient Profile:   Daisy Parker is a 78 y.o. female with a hx of frontal temporal dementia, CKD stage IV, sinus bradycardia in 05/2018, HTN, reported hypothyroidism, anemia, cataracts, duodenal perforation, reported syncope in 2017 (details unclear) who is being seen today for the evaluation of possible syncope/heart block at the request of Dr. Koleen Distance.  History of Present Illness:   Regarding cardiac history, there is mention in H&P she has history of HFpEF due to presence of diastolic dysfunction referenced on a prior echo (not in our system). Her daughter recalls her mother having years of lower extremity edema in the context of HTN. [name] was previously a very busy lady who worked 2 jobs providing for their family since her husband was chronically ill. [place] but around 2017 she was noted to be living in substandard living environment and letting self-care slip so was moved to [place] to live with daughter Daisy Parker. In 2018 she had to be admitted due to fall and behavioral disturbances, ultimately diagnosed with frontal temporal dementia. Her daughter was unable to care for her at home so she has resided in memory care. She required a stay at [facility] and had had a history in recent years of becoming extremely combative in the hospital setting. This was observed in 05/2018 when she was admitted with encephalopathy and AKI in setting of poor oral intake back in 05/2018. Losartan and HCTZ were discontinued. She was also reported to be bradycardic at 37 during that admission. Saved CV strip showed this was sinus bradycardia. She has not been on any AVN blocking agents. Heart rate was normal at  discharge.  She was admitted with episode of possible syncope vs seizure at her nursing facility. The patient is a poor historian due to her dementia. Notes indicate the patient felt like she was going to pass out and was assisted to the ground without obvious injuries. The facility had indicated she had not been feeling well lately with vague lower abdominal discomfort and fatigue. There was also report of loose stools and dysuria. The daughter says they told her she might have had a seizure, but did not provide other details of whether there was seizure-like activity noted. Ms. Daisy Parker had a very restless, agitated night consistent with prior hospital stays. She is sleepy this morning. Daughter also reports decreased appetite and some weight loss (was 203 in 05/2018, now 176). The patient does not contribute to the conversation today except to say she is unwilling to get out of bed today.  Labs show hsTroponin 27->28, AKI with Cr 2.81 (previously 1.79 in 05/2018), mild anemia with Hgb 11.1, TSH wnl. Renal US with increased cortical echogenicity consistent with chronic kidney disease. MRI shows no acute intracranial abnormality. CXR with stable cardiomegaly without acute cardiopulmonary disease. Initial EKG showed NSR 67bpm with what appears to be brief Mobitz 1 AV block with dropped beat, then long first degree AV block, RBBB, LAFB. Telemetry shows intermittent bradycardia and 2nd degree AV block with intermittent 2:1 block and HR in the upper 30s. It is not clear that she is acutely symptomatic with this.  2D echo yesterday showed EF 65-70%, grade 2 DD, trivial pericardial effusion, AV/MV calcification, mild dilation of ascending aorta, and small  mass is present attached to the chordal structure of the posterior mitral valve leaflet, measuring 0.9 cm x 0.9 cm. It moves with the valve, favoring calcified chordal apparatus. The differential also includes papillary fibroelastoma and vegetation. There is no  destruction of the valve to suggest vegetation. Blood cultures are pending. She is afebrile.   Past Medical History:  Diagnosis Date   Anemia    Bradycardia    Cataract    CKD (chronic kidney disease), stage IV (HCC)    Dementia (HCC)    Duodenal perforation (HCC)    Hypertension    Hypothyroidism    Syncope     History reviewed. No pertinent surgical history.   Inpatient Medications: Scheduled Meds:  cholecalciferol  2,000 Units Oral Daily   diclofenac sodium  2 g Topical TID   enoxaparin (LOVENOX) injection  30 mg Subcutaneous Q24H   fluticasone  1 spray Each Nare Daily   hydroxypropyl methylcellulose / hypromellose  1 drop Both Eyes TID   latanoprost  1 drop Both Eyes QHS   levothyroxine  125 mcg Oral Q0600   multivitamins with iron  1 tablet Oral Daily   OLANZapine  7.5 mg Oral QHS   polyethylene glycol  17 g Oral Daily   Continuous Infusions:  sodium chloride 125 mL/hr at 03/09/19 1047   cefTRIAXone (ROCEPHIN)  IV 1 g (03/09/19 1131)   PRN Meds:   Home Meds: Prior to Admission medications   Medication Sig Start Date End Date Taking? Authorizing Provider  acetaminophen (TYLENOL) 500 MG tablet Take 1,000 mg by mouth 3 (three) times daily. 0800, 1400, 2000.   Yes [provider]  allopurinol (ZYLOPRIM) 100 MG tablet Take 100 mg by mouth daily.   Yes [provider]  amLODipine (NORVASC) 5 MG tablet Take 2.5-5 mg by mouth See admin instructions. Take 2.5mg  by mouth in the morning, then take 5mg  tablet by mouth in the evening.   Yes [provider]  Cholecalciferol (VITAMIN D) 50 MCG (2000 UT) tablet Take 2,000 Units by mouth daily.   Yes [provider]  diclofenac sodium (VOLTAREN) 1 % GEL Apply 2 g topically 3 (three) times daily. To knees, hips, and lower back.   Yes [provider]  fluticasone (FLONASE) 50 MCG/ACT nasal spray Place 1 spray into both nostrils daily.   Yes [provider]    latanoprost (XALATAN) 0.005 % ophthalmic solution Place 1 drop into both eyes at bedtime.   Yes [provider]  levothyroxine (SYNTHROID) 150 MCG tablet Take 150 mcg by mouth daily before breakfast.   Yes [provider]  Multiple Vitamins-Iron (DAILY VITE MULTIVITAMIN/IRON) TABS Take 1 tablet by mouth daily.   Yes [provider]  OLANZapine (ZYPREXA) 7.5 MG tablet Take 7.5 mg by mouth at bedtime.   Yes [provider]  polyethylene glycol (MIRALAX / GLYCOLAX) packet Take 17 g by mouth daily. 05/17/18  Yes Dorrell, Andree Elk, MD    Allergies:    Allergies  Allergen Reactions   Codeine Anaphylaxis   Sulfa Antibiotics Anaphylaxis    Social History:   Social History   Socioeconomic History   Marital status: Widowed    Spouse name: Not on file   Number of children: Not on file   Years of education: Not on file   Highest education level: Not on file  Occupational History   Not on file  Social Needs   Financial resource strain: Not on file   Food insecurity  Worry: Not on file    Inability: Not on file   Transportation needs    Medical: Not on file    Non-medical: Not on file  Tobacco Use   Smoking status: Former Smoker   Smokeless tobacco: Current User  Substance and Sexual Activity   Alcohol use: Never    Frequency: Never   Drug use: Never   Sexual activity: Not on file  Lifestyle   Physical activity    Days per week: Not on file    Minutes per session: Not on file   Stress: Not on file  Relationships   Social connections    Talks on phone: Not on file    Gets together: Not on file    Attends religious service: Not on file    Active member of club or organization: Not on file    Attends meetings of clubs or organizations: Not on file    Relationship status: Not on file   Intimate partner violence    Fear of current or ex partner: Not on file    Emotionally abused: Not on file    Physically abused: Not on  file    Forced sexual activity: Not on file  Other Topics Concern   Not on file  Social History Narrative   Not on file     Family History:   Family History  Family history unknown: Yes  Patient is demented and daughter does not know pt's family history.   ROS:  Prohibited by dementia, but noted above as reported by outside sources  Physical Exam/Data:   Vitals:   03/08/19 1438 03/08/19 1938 03/09/19 0420 03/09/19 0424  BP: (!) 157/70 126/76  (!) 141/82  Pulse: (!) 48 71  60  Resp: 18 18  16   Temp: 98.3 F (36.8 C) 98.8 F (37.1 C)  98.6 F (37 C)  TempSrc: Oral Oral  Oral  SpO2: 99% 99%  96%  Weight: 79.2 kg  80 kg   Height: 5\' 6"  (1.676 m)       Intake/Output Summary (Last 24 hours) at 03/09/2019 1134 Last data filed at 03/09/2019 0420 Gross per 24 hour  Intake 987.92 ml  Output 350 ml  Net 637.92 ml   Last 3 Weights 03/09/2019 03/08/2019 05/16/2018  Weight (lbs) 176 lb 4.8 oz 174 lb 8 oz 202 lb 13.2 oz  Weight (kg) 79.969 kg 79.153 kg 92 kg     Body mass index is 28.46 kg/m.  General: Well developed, well nourished elderly WF, in no acute distress. Head: Normocephalic, atraumatic, sclera non-icteric, no xanthomas, nares are without discharge.  Neck: Negative for carotid bruits. JVD not elevated. Lungs: Clear bilaterally to auscultation without wheezes, rales, or rhonchi. Breathing is unlabored. Heart: RRR with S1 S2. No murmurs, rubs, or gallops appreciated. Abdomen: Soft, non-tender, non-distended with normoactive bowel sounds. No hepatomegaly. No rebound/guarding. No obvious abdominal masses. Msk:  Strength and tone appear normal for age. Extremities: No clubbing or cyanosis. No edema.  Distal pedal pulses are 2+ and equal bilaterally. Neuro: Sleepy but arousable, oriented to self, otherwise confused. Follows commands. Psych:  Stoic, calm affect. Informs me she is unwilling to get out of bed today.  EKG:  The EKG was personally reviewed and demonstrates:    1st - 03/08/19: NSR 67bpm with what appears to be brief 2nd degree AV block type 1 with dropped beat, then long first degree AV block, RBBB, LAFB 2nd - 03/08/19: NSR 70bpm with 2nd degree AV block type  1, first degree AVB, RBBB, LAFB  Telemetry:  Referenced above  Relevant CV Studies: 2D echo 03/08/19 IMPRESSIONS  1. A small mass is present attached to the chordal structure of the posterior mitral valve leaflet, measuring 0.9 cm x 0.9 cm. It moves with the valve, favoring calcified chordal apparatus. The differential also includes papillary fibroelastoma and  vegetation. There is no destruction of the valve to suggest vegetation. Would obtain blood cultures to exclude bacteremia/infective endocarditis and consider TEE for better characterization if clinically indicated.  2. Left ventricular ejection fraction, by visual estimation, is 65 to 70%. The left ventricle has normal function. There is moderately increased left ventricular hypertrophy.  3. Left ventricular diastolic parameters are consistent with Grade I diastolic dysfunction (impaired relaxation).  4. The left ventricle has no regional wall motion abnormalities.  5. Global right ventricle has normal systolic function.The right ventricular size is normal. No increase in right ventricular wall thickness.  6. Left atrial size was normal.  7. Right atrial size was normal.  8. Presence of pericardial fat pad.  9. Trivial pericardial effusion is present. 10. Moderate calcification of the mitral valve leaflet(s). 11. Moderate to severe mitral annular calcification. 12. The mitral valve is degenerative. Trace mitral valve regurgitation. 13. The tricuspid valve is grossly normal. Tricuspid valve regurgitation is trivial. 14. The aortic valve is tricuspid. Aortic valve regurgitation is not visualized. Mild to moderate aortic valve sclerosis/calcification without any evidence of aortic stenosis. 15. The pulmonic valve was grossly normal.  Pulmonic valve regurgitation is trivial. 16. There is mild dilatation of the ascending aorta measuring 37 mm. 17. Normal pulmonary artery systolic pressure. 18. The pericardial effusion is circumferential.   Laboratory Data:  High Sensitivity Troponin:   Recent Labs  Lab 03/08/19 1032 03/08/19 1210  TROPONINIHS 27* 28*     Cardiac EnzymesNo results for input(s): TROPONINI in the last 168 hours. No results for input(s): TROPIPOC in the last 168 hours.  Chemistry Recent Labs  Lab 03/08/19 1032 03/08/19 1550 03/09/19 0432  NA 138 140 140  K 5.0 4.6 4.5  CL 105 106 108  CO2 20* 21* 21*  GLUCOSE 125* 129* 98  BUN 45* 43* 43*  CREATININE 2.81* 2.81* 2.79*  CALCIUM 10.0 10.0 9.6  GFRNONAA 15* 15* 15*  GFRAA 18* 18* 18*  ANIONGAP 13 13 11    Hematology Recent Labs  Lab 03/08/19 1032 03/09/19 0432  WBC 7.0 6.6  RBC 3.75* 3.64*  HGB 11.1* 11.0*  HCT 34.9* 33.8*  MCV 93.1 92.9  MCH 29.6 30.2  MCHC 31.8 32.5  RDW 13.9 13.9  PLT 174 178   Radiology/Studies:  Mr Brain Wo Contrast  Result Date: 03/08/2019 CLINICAL DATA:  Encephalopathy EXAM: MRI HEAD WITHOUT CONTRAST TECHNIQUE: Multiplanar, multiecho pulse sequences of the brain and surrounding structures were obtained without intravenous contrast. COMPARISON:  None. FINDINGS: BRAIN: There is no acute infarct, acute hemorrhage or extra-axial collection. The white matter signal is normal for the patient's age. There is generalized atrophy without lobar predilection. The midline structures are normal. VASCULAR: The major intracranial arterial and venous sinus flow voids are normal. Susceptibility-sensitive sequences show no chronic microhemorrhage or superficial siderosis. SKULL AND UPPER CERVICAL SPINE: Calvarial bone marrow signal is normal. There is no skull base mass. The visualized upper cervical spine and soft tissues are normal. SINUSES/ORBITS: There are no fluid levels or advanced mucosal thickening. The mastoid air cells  and middle ear cavities are free of fluid. The orbits are normal. IMPRESSION: 1. No  acute intracranial abnormality. 2. Generalized atrophy without lobar predilection. Electronically Signed   By: Ulyses Jarred M.D.   On: 03/08/2019 23:22   US Renal  Result Date: 03/09/2019 CLINICAL DATA:  Acute kidney injury. EXAM: RENAL / URINARY TRACT ULTRASOUND COMPLETE COMPARISON:  12/29/2015 FINDINGS: Right Kidney: Renal measurements: 12.4 x 5.1 x 4.3 cm = volume: 141 mL. Increased cortical echogenicity. No hydronephrosis. Small echogenic focus in the mid kidney may represent a small nonobstructing calculus or vascular calcification. Simple cysts include a 2 cm upper pole and 3.5 cm lower pole cyst. Both have a benign appearance. Left Kidney: Renal measurements: 9.3 x 5.1 x 4.0 cm = volume: 100 mL. Increased cortical echogenicity. No hydronephrosis. Bladder: Decompressed. Other: None. IMPRESSION: Both kidneys demonstrate increased cortical echogenicity consistent with chronic kidney disease. No hydronephrosis. Electronically Signed   By: Aletta Edouard M.D.   On: 03/09/2019 09:32   Dg Chest Port 1 View  Result Date: 03/08/2019 CLINICAL DATA:  Syncope EXAM: PORTABLE CHEST 1 VIEW COMPARISON:  05/15/2018 FINDINGS: Heart size and mediastinal contours are stable with signs of cardiac enlargement. No signs of consolidation, pleural effusion or interstitial thickening. No acute bone finding. IMPRESSION: Signs of cardiomegaly without acute cardiopulmonary disease. Electronically Signed   By: Zetta Bills M.D.   On: 03/08/2019 10:47    Assessment and Plan:   1. Possible syncope with intermittent bradycardia (baseline trifascicular block), second degree AV block type 1 but with intermittent 2:1 block - details of LOC event are unclear. However, the patient is noted to have evidence of underlying conduction disease with episodic bradycardia. I do not see any reversible causes at present time. This is a difficult  situation, confounded by her dementia and CKD. Given her history of agitation and combativeness, neither I nor her daughter are certain she would be able to comply with the necessary physical instructions/restrictions necessary for successful and safe pacemaker implantation. She is also showing signs of failure to thrive with worsening CKD, poor appetite, and weight loss. Originally our team physician yesterday recommended outpatient monitor and EP f/u but this plan may change. Addendum: per discussion with Dr. Radford Pax, plan to have EP see tomorrow in formal consult. Will hold off off on outpatient monitor request pending EP input. If monitor is felt needed, a repeat request/order will need to be sent.  2. Mitral valve mass of uncertain etiology - blood cultures pending. Will review echo study with MD. MRI brain unremarkable.  3. AKI on CKD stage IV in context of frontal temporal dementia and failure to thrive - per primary team. Daughter reports poor oral intake recently. Avoid nephrotoxic agents. Given her recent downward trajectory, would consider beginning goals of care conversations.  4. Minimally elevated troponin - low/flat. No evidence to suggest ACS otherwise. LVEF normal. No further evaluation planned.   5. HTN - BP elevated on admission, but trending more towards normal.  6. Hypothyroidism - TSH within normal limits. Management per primary team.  For questions or updates, please contact [place] Please consult [email or web address] for contact info under   Signed, Charlie Pitter, PA-C  03/09/2019 11:34 AM

## 2019-03-09 NOTE — Progress Notes (Signed)
Patient BP 185/73 MD notified see epic for new order. Will continue to monitor the patient.Marland Kitchen

## 2019-03-09 NOTE — Progress Notes (Signed)
   Subjective: Ms. Smidt fairly sleepy on our evaluation this morning, but did arouse when daughter would repeat questions to her and with physical exam. She did not indicate she was in any pain this morning.   Daughter at bedside endorsed some intermittent agitation overnight, but is redirectable with her at bedside. We updated her on work-up thus far and that we'd recommend cardiology consult regarding the echo findings and heart block.   Objective:  Vital signs in last 24 hours: Vitals:   03/08/19 1438 03/08/19 1938 03/09/19 0420 03/09/19 0424  BP: (!) 157/70 126/76  (!) 141/82  Pulse: (!) 48 71  60  Resp: 18 18  16   Temp: 98.3 F (36.8 C) 98.8 F (37.1 C)  98.6 F (37 C)  TempSrc: Oral Oral  Oral  SpO2: 99% 99%  96%  Weight: 79.2 kg  80 kg   Height: 5\' 6"  (1.676 m)      General: elderly woman resting comfortably in bed in NAD CV: RRR; no m/r/g Ext: no edema  Psych: easily agitated with physical exam    Assessment/Plan:  Active Problems:   Syncope  Corah Hickson is a 78 year old female with past medical history of  CKD Stage III, frontal temporal dementia (diagnosed in 2018) ,HTN, hypothyroidism , HFpEF, cataracts, and history of syncope in 2017 who presents after witnessed syncopal episode at her memory care unit.   1. Syncope: etiology unclear at this point. She does appear to have a UTI with some concurrent dehydration in the setting of poor PO intake which we are treating with antibiotics and fluids. She also has high grade heart block. Telemetry reviewed which shows persistent 2:1 AV block. We have held amlodipine to to see if this resolves. Clinical picture further complicated by echo which showed a mass seen on the mitral valve. Obtained an MRI of the brain to ensure there was no embolization from a potential embolization. Blood cx were also obtained to exclude bacteremia which would raise concern for endocarditis. Have consulted cardiology regarding need for TEE versus  continuing to monitor on interventions we have done thus far. Greatly appreciate their recommendations.   2. AKI on CKD III: seems likely pre-renal. Obtaining renal ultrasound to rule out post-renal etiologies. Will continue IVF until patient is more awake and able to take in PO.  - daily BMPs  - strict I&Os  3. UTI - initiated on Rocephin - follow-up cx   4. HTN - holding amlodipine in setting of above - blood pressure stable  5. Hypothyroidism  - TSH normal; continue current home dose   Dispo: Anticipated discharge in approximately 1-2 day(s) pending further medical work-up.   Modena Nunnery D, DO 03/09/2019, 10:10 AM Pager: [phone]

## 2019-03-09 NOTE — Progress Notes (Signed)
OT Cancellation Note  Patient Details Name: Daisy Parker MRN: [id number] DOB: [birth year]   Cancelled Treatment:    Reason Eval/Treat Not Completed: Other (comment)Doctors in room talking with family. Will re-attempt eval later today.  Daisy Parker, [facility] Pager [phone] Office [phone]     Daisy Parker 03/09/2019, 8:21 AM

## 2019-03-09 NOTE — Progress Notes (Signed)
Patient's HR dropped to high 30s for a couple of seconds a couple of times last night. Notified Dr. Sheppard Coil. Will continue to monitor.

## 2019-03-09 NOTE — Progress Notes (Signed)
Internal Medicine Teaching Service Attending:   I saw and examined the patient. I reviewed the resident's note and I agree with the resident's findings and plan as documented in the resident's note.  Principal Problem:   Syncope Active Problems:   Dementia (HCC)   Mitral valve mass   Second degree AV block, Mobitz type I   Second degree AV block, Mobitz type II   RBBB  Hospital day #2 for this 78 year old woman living with frontotemporal dementia admitted with syncope likely due to second-degree type I and type II AV block.  Greatly appreciate cardiology consultation, planning for EP consultation tomorrow.  We updated the patient's daughter at the bedside and she understands that this will be a risk-benefit discussion given the patient's overall functional status.  For the urinary tract infection she is being treated with ceftriaxone, we are waiting urine culture results.  For the mitral valve mass, we favor a incidental calcification, blood culture is pending, MRI brain was normal.   Lalla Brothers, MD [facility]

## 2019-03-10 DIAGNOSIS — F0391 Unspecified dementia with behavioral disturbance: Secondary | ICD-10-CM

## 2019-03-10 DIAGNOSIS — R001 Bradycardia, unspecified: Secondary | ICD-10-CM

## 2019-03-10 DIAGNOSIS — Z7189 Other specified counseling: Secondary | ICD-10-CM

## 2019-03-10 DIAGNOSIS — Z515 Encounter for palliative care: Secondary | ICD-10-CM

## 2019-03-10 LAB — BASIC METABOLIC PANEL
Anion gap: 11 (ref 5–15)
BUN: 34 mg/dL — ABNORMAL HIGH (ref 8–23)
CO2: 20 mmol/L — ABNORMAL LOW (ref 22–32)
Calcium: 9.1 mg/dL (ref 8.9–10.3)
Chloride: 111 mmol/L (ref 98–111)
Creatinine, Ser: 2.33 mg/dL — ABNORMAL HIGH (ref 0.44–1.00)
GFR calc Af Amer: 22 mL/min — ABNORMAL LOW (ref >60)
GFR calc non Af Amer: 19 mL/min — ABNORMAL LOW (ref >60)
Glucose, Bld: 96 mg/dL (ref 70–99)
Potassium: 4.1 mmol/L (ref 3.5–5.1)
Sodium: 142 mmol/L (ref 135–145)

## 2019-03-10 MED ORDER — LACTATED RINGERS IV SOLN
INTRAVENOUS | Status: DC
  Administered 2019-03-10 – 2019-03-14 (×9): via INTRAVENOUS

## 2019-03-10 MED ORDER — SODIUM CHLORIDE 0.9 % IV SOLN
INTRAVENOUS | Status: DC
  Administered 2019-03-10: 10:00:00 via INTRAVENOUS

## 2019-03-10 NOTE — Plan of Care (Signed)
  Problem: Health Behavior/Discharge Planning: Goal: Ability to manage health-related needs will improve Outcome: Progressing   Problem: Activity: Goal: Risk for activity intolerance will decrease Outcome: Progressing   Problem: Coping: Goal: Level of anxiety will decrease Outcome: Progressing   Problem: Pain Managment: Goal: General experience of comfort will improve Outcome: Progressing   Problem: Safety: Goal: Ability to remain free from injury will improve Outcome: Progressing   Problem: Skin Integrity: Goal: Risk for impaired skin integrity will decrease Outcome: Progressing   

## 2019-03-10 NOTE — Evaluation (Addendum)
Occupational Therapy Evaluation and Discharge Patient Details Name: Daisy Parker MRN: [id number] DOB: 17-May-1940 Today's Date: 03/10/2019    History of Present Illness 78 year old female with past medical history of  CKD Stage III, frontal temporal dementia (diagnosed in 2018) ,HTN, hypothyroidism , HFpEF, cataracts, and history of syncope in 2017 who presents after episode of syncope at nursing facility.   Clinical Impression   Pt admitted for above, from memory care unit requiring assistance for ADLs as needed, supervision for mobility with RW.  Pt currently able to complete transfers with min guard to supervision, toileting with supervision, grooming at sink with supervision and LB ADls with min assist. Pt requires cueing for safety and awareness throughout session, easily agitated.  She presents with mild unsteadiness, impaired balance (preference to not use RW today), and decreased activity tolerance.  She has support at memory care unit as needed, and believe she will best benefit from follow up OT services at [facility] level in order to ensure return to PLOF with ADLs,mobility in familiar environment. Daughter agreeable with this plan, no further acute OT needs identified.     Follow Up Recommendations  Home health OT;Supervision/Assistance - 24 hour    Equipment Recommendations  None recommended by OT    Recommendations for Other Services       Precautions / Restrictions Precautions Precautions: Fall Restrictions Weight Bearing Restrictions: No      Mobility Bed Mobility Overal bed mobility: Independent                Transfers Overall transfer level: Needs assistance Equipment used: None Transfers: Sit to/from Stand Sit to Stand: Supervision;Min guard         General transfer comment: supervision to min guard for safety, balance and line mgmt     Balance Overall balance assessment: Needs assistance Sitting-balance support: No upper extremity supported;Feet  supported Sitting balance-Leahy Scale: Good     Standing balance support: No upper extremity supported;During functional activity Standing balance-Leahy Scale: Fair Standing balance comment: min guard to supervision, pt reaching out for UE support                           ADL either performed or assessed with clinical judgement   ADL Overall ADL's : Needs assistance/impaired     Grooming: Supervision/safety;Standing Grooming Details (indicate cue type and reason): washing hands             Lower Body Dressing: Minimal assistance Lower Body Dressing Details (indicate cue type and reason): to don socks, supervision sit to stand  Toilet Transfer: Min Statistician Details (indicate cue type and reason): for balance and line mgmt Toileting- Clothing Manipulation and Hygiene: Supervision/safety;Sit to/from stand       Functional mobility during ADLs: Min guard;Supervision/safety General ADL Comments: pt limited by cognition, safety awareness     Vision   Vision Assessment?: No apparent visual deficits     Perception     Praxis      Pertinent Vitals/Pain Pain Assessment: No/denies pain     Hand Dominance     Extremity/Trunk Assessment Upper Extremity Assessment Upper Extremity Assessment: Overall WFL for tasks assessed   Lower Extremity Assessment Lower Extremity Assessment: Defer to PT evaluation   Cervical / Trunk Assessment Cervical / Trunk Assessment: Kyphotic   Communication Communication Communication: HOH   Cognition Arousal/Alertness: Awake/alert Behavior During Therapy: WFL for tasks assessed/performed Overall Cognitive Status: History of cognitive impairments - at baseline  General Comments: able to follow commands, becomes agitated when asked to do a task that she doens't want to complete   General Comments       Exercises     Shoulder Instructions       Home Living Family/patient expects to be discharged to:: Other (Comment)(memory care unit)                             Home Equipment: Gilford Rile - 2 wheels   Additional Comments: Per daughter, pt from memory care ALF.       Prior Functioning/Environment Level of Independence: Needs assistance  Gait / Transfers Assistance Needed: pt ambulates with RW and supervision per dtr. Pt participates in PT at ALF ADL's / Homemaking Assistance Needed: pts daugther reports she needs some assist with ADLS, mostly verbal cueing to direct patient   Communication / Swallowing Assistance Needed: HOH          OT Problem List: Decreased activity tolerance;Decreased safety awareness;Decreased cognition;Cardiopulmonary status limiting activity;Obesity;Impaired balance (sitting and/or standing)      OT Treatment/Interventions:      OT Goals(Current goals can be found in the care plan section) Acute Rehab OT Goals Patient Stated Goal: to get her back home  OT Goal Formulation: With family  OT Frequency:     Barriers to D/C:            Co-evaluation              AM-PAC OT "6 Clicks" Daily Activity     Outcome Measure Help from another person eating meals?: A Little Help from another person taking care of personal grooming?: A Little Help from another person toileting, which includes using toliet, bedpan, or urinal?: A Little Help from another person bathing (including washing, rinsing, drying)?: A Little Help from another person to put on and taking off regular upper body clothing?: A Little Help from another person to put on and taking off regular lower body clothing?: A Little 6 Click Score: 18   End of Session Equipment Utilized During Treatment: Rolling walker Nurse Communication: Mobility status  Activity Tolerance: Patient tolerated treatment well Patient left: in bed;with call bell/phone within reach;with family/visitor present  OT Visit Diagnosis: Other  abnormalities of gait and mobility (R26.89);Other (comment)(decreased activity tolerance)                Time: [id number] OT Time Calculation (min): 32 min Charges:  OT General Charges $OT Visit: 1 Visit OT Evaluation $OT Eval Moderate Complexity: 1 Mod OT Treatments $Self Care/Home Management : 8-22 mins  Delight Stare, OT Acute Rehabilitation Services Pager [phone] Office [phone]   Delight Stare 03/10/2019, 3:57 PM

## 2019-03-10 NOTE — Progress Notes (Signed)
Patient's family made the decision for pacemaker placement. Palliative nurse Stanton Kidney was called and a voicemail was left to update her about current decision. cardiology paged and awaiting a call back to update them.

## 2019-03-10 NOTE — Consult Note (Signed)
Consultation Note Date: 03/10/2019   Patient Name: Daisy Parker  DOB: Mar 30, 1941  MRN: [id number]  Age / Sex: 78 y.o., female  PCP: Patient, No Pcp Per Referring Physician: Axel Filler, *  Reason for Consultation: Establishing goals of care and Psychosocial/spiritual support  HPI/Patient Profile: 78 y.o. female   admitted on 03/08/2019 with   past medical history of  CKD Stage III, frontal temporal dementia (diagnosed in 2018) ,HTN, hypothyroidism , CHF, cataracts, and history of syncope in 2017 who presents after episode of syncope at nursing facility.    Per ED note patient was assisted to the ground without injury. Not clear if patient lost consciousness. No report of seizure activity or post ictal state.   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  Patient is a resident at [facility].     Per her daughter the patient has had some documented past psychiatric history consistent with depression, and hoarding.  She describes her as "quirky".  There is a documented admission at [facility] in October 2018  secondary to psychosis.      Patient does not have capacity to make her own medical decisions.  Her only daughter faces treatment option decisions (pacemaker placement), advanced directive decisions and anticipatory care needs.       Clinical Assessment and Goals of Care:   This NP Wadie Lessen reviewed medical records, received report from team, assessed the patient and then meet at the patient's bedside along with her daughter/ Lovey Newcomer  to discuss diagnosis, prognosis, GOC, EOL wishes disposition and options.  Concept of Hospice and Palliative Care were discussed  A detailed discussion was had today regarding advanced  directives.  Concepts specific to code status, artifical feeding and hydration, continued IV antibiotics and rehospitalization was had.  The difference between a aggressive medical intervention path  and a palliative comfort care path for this patient at this time was had.  Values and goals of care important to patient and family were attempted to be elicited.  MOST form introduced, a copy was left for review,  and a Hard Choices booklet was left with daughter.   Questions and concerns addressed.   Family encouraged to call with questions or concerns.    PMT will continue to support holistically.   There is no documented healthcare power of attorney or advanced directive.  The patient's only daughter and next of kin is main support person and decision maker at this time     SUMMARY OF RECOMMENDATIONS    Code Status/Advance Care Planning:  Full code   Encouraged daughter to consider DNR/DNI status understanding limited outcomes in similar patients.   Discussed with daughter the importance of continued conversation with the  medical providers regarding overall plan of care and treatment options,  ensuring decisions are within the context of the patients values and GOCs.   Palliative Prophylaxis:  Aspiration, Bowel Regimen, Delirium Protocol and Oral Care  Additional Recommendations (Limitations, Scope, Preferences):  At this time the daughter is processing all the information she is received and making decision regarding pacemaker placement for her mother.  She will let the team know this afternoon her decision, she is leaning towards proceeding with pacemaker  Psycho-social/Spiritual:   Desire for further Chaplaincy support:yes/involved  Additional Recommendations: Created space and opportunity for daughter to explore her thoughts and feelings regarding her mother's current medical situation.  She speaks to being an only child and the long medical path that both she and her  mother were on as they assisted her father on a 20-year dialysis course of treatment.  She stepped in to care for her mother several years ago when patietn's frontal temporal dementia worsened.  Patient's daughter shares the difficulty of caring for a family member with significant dementia and the difficulty of making advanced directive decisions "wanting to do the right thing."  Prognosis:   Unable to determine  Discharge Planning: Patient will likely return back to her facility with outpatient base palliative care services versus hospice services.  This discharge plan will depend on decision that family makes regarding pacemaker placement     Primary Diagnoses: Present on Admission: . Syncope . Mitral valve mass . Second degree AV block, Mobitz type I . Second degree AV block, Mobitz type II . RBBB . Dementia ([place])   I have reviewed the medical record, interviewed the patient and family, and examined the patient. The following aspects are pertinent.  Past Medical History:  Diagnosis Date  . Anemia   . Bradycardia   . Cataract   . CKD (chronic kidney disease), stage IV ([place])   . Dementia ([place])   . Duodenal perforation ([place])   . Hypertension   . Hypothyroidism   . Syncope    Social History   Socioeconomic History  . Marital status: Widowed    Spouse name: Not on file  . Number of children: Not on file  . Years of education: Not on file  . Highest education level: Not on file  Occupational History  . Not on file  Social Needs  . Financial resource strain: Not on file  . Food insecurity    Worry: Not on file    Inability: Not on file  . Transportation needs    Medical: Not on file    Non-medical: Not on file  Tobacco Use  . Smoking status: Former Research scientist (life sciences)  . Smokeless tobacco: Current User  Substance and Sexual Activity  . Alcohol use: Never    Frequency: Never  . Drug use: Never  . Sexual activity: Not on file  Lifestyle  . Physical activity    Days per  week: Not on file    Minutes per session: Not on file  . Stress: Not on file  Relationships  . Social Herbalist on phone: Not on file    Gets together: Not on file    Attends religious service: Not on file    Active member of club or organization: Not on file    Attends meetings of clubs or organizations: Not on file    Relationship status: Not on file  Other Topics Concern  . Not on file  Social History Narrative  . Not on file   Family History  Family history unknown: Yes   Scheduled Meds: . cholecalciferol  2,000 Units Oral Daily  . diclofenac sodium  2 g Topical  TID  . enoxaparin (LOVENOX) injection  30 mg Subcutaneous Q24H  . fluticasone  1 spray Each Nare Daily  . hydroxypropyl methylcellulose / hypromellose  1 drop Both Eyes TID  . latanoprost  1 drop Both Eyes QHS  . levothyroxine  125 mcg Oral Q0600  . multivitamins with iron  1 tablet Oral Daily  . OLANZapine  7.5 mg Oral QHS  . polyethylene glycol  17 g Oral Daily   Continuous Infusions: . sodium chloride    . cefTRIAXone (ROCEPHIN)  IV 1 g (03/09/19 1131)   PRN Meds:. Medications Prior to Admission:  Prior to Admission medications   Medication Sig Start Date End Date Taking? Authorizing Provider  acetaminophen (TYLENOL) 500 MG tablet Take 1,000 mg by mouth 3 (three) times daily. 0800, 1400, 2000.   Yes [provider]  allopurinol (ZYLOPRIM) 100 MG tablet Take 100 mg by mouth daily.   Yes [provider]  amLODipine (NORVASC) 5 MG tablet Take 2.5-5 mg by mouth See admin instructions. Take 2.5mg  by mouth in the morning, then take 5mg  tablet by mouth in the evening.   Yes [provider]  Cholecalciferol (VITAMIN D) 50 MCG (2000 UT) tablet Take 2,000 Units by mouth daily.   Yes [provider]  diclofenac sodium (VOLTAREN) 1 % GEL Apply 2 g topically 3 (three) times daily. To knees, hips, and lower back.   Yes [provider]  fluticasone (FLONASE) 50  MCG/ACT nasal spray Place 1 spray into both nostrils daily.   Yes [provider]  latanoprost (XALATAN) 0.005 % ophthalmic solution Place 1 drop into both eyes at bedtime.   Yes [provider]  levothyroxine (SYNTHROID) 150 MCG tablet Take 150 mcg by mouth daily before breakfast.   Yes [provider]  Multiple Vitamins-Iron (DAILY VITE MULTIVITAMIN/IRON) TABS Take 1 tablet by mouth daily.   Yes [provider]  OLANZapine (ZYPREXA) 7.5 MG tablet Take 7.5 mg by mouth at bedtime.   Yes [provider]  polyethylene glycol (MIRALAX / GLYCOLAX) packet Take 17 g by mouth daily. 05/17/18  Yes Dorrell, Andree Elk, MD   Allergies  Allergen Reactions  . Codeine Anaphylaxis  . Sulfa Antibiotics Anaphylaxis   Review of Systems  All other systems reviewed and are negative.   Physical Exam HENT:     Mouth/Throat:     Mouth: Mucous membranes are moist.     Pharynx: Oropharynx is clear.  Cardiovascular:     Rate and Rhythm: Bradycardia present.  Skin:    General: Skin is warm and dry.  Neurological:     Mental Status: She is alert.  Psychiatric:        Cognition and Memory: Cognition is impaired.     Vital Signs: BP (!) 162/70 (BP Location: Right Arm)   Pulse (!) 52   Temp 98.7 F (37.1 C)   Resp 18   Ht 5\' 6"  (1.676 m)   Wt 78.9 kg   SpO2 99%   BMI 28.08 kg/m  Pain Scale: 0-10   Pain Score: 0-No pain   SpO2: SpO2: 99 % O2 Device:SpO2: 99 % O2 Flow Rate: .   IO: Intake/output summary:   Intake/Output Summary (Last 24 hours) at 03/10/2019 0913 Last data filed at 03/10/2019 0656 Gross per 24 hour  Intake 1600 ml  Output 950 ml  Net 650 ml    LBM: Last BM Date: 03/08/19 Baseline Weight: Weight: 79.2 kg Most recent weight: Weight: 78.9 kg  Palliative Assessment/Data: 40 %   Discussed with Dr Court Joy  PMT will continue to support holistically  Time In: 1030 Time Out: 1140 Time Total: 70 minutes Greater than 50%  of  this time was spent counseling and coordinating care related to the above assessment and plan.  Signed by: Wadie Lessen, NP   Please contact Palliative Medicine Team phone at [phone] for questions and concerns.  For individual provider: See Shea Evans

## 2019-03-10 NOTE — Progress Notes (Signed)
Patient is A&Ox1-2 and daughter would like to be present when Palliative Care and Cardiovascular go into the room. The patient is not aware Palliative is consulted as this was requested by daughter. Thank you.

## 2019-03-10 NOTE — Consult Note (Signed)
Cardiology Consultation:   Patient ID: Daisy Parker MRN: [id number]; DOB: June 16, 1940  Admit date: 03/08/2019 Date of Consult: 03/10/2019  Primary Care Provider: Patient, No Pcp Per Primary Cardiologist: [name], MD  Primary Electrophysiologist:  None new   Patient Profile:   Daisy Parker is a 78 y.o. female with a hx of dementia and falls  who is being seen today for the evaluation of mobitz 2, second degree AV block at the request of Dr. Radford Pax.  History of Present Illness:   [name] was admitted after a fall and altered mentation. I have tried calling her daughter but unable to reach her. The patient is quite pleasant today and provided a very nice history of living in [place] and moving to [place] and that she has one daughter. Apparently she sun downs and develops at times severe agitation/aggressive behavior and has fairly advanced dementia. She remembers falling and thinks that she may have lost consciousness.   Heart Pathway Score:     Past Medical History:  Diagnosis Date   Anemia    Bradycardia    Cataract    CKD (chronic kidney disease), stage IV (HCC)    Dementia (HCC)    Duodenal perforation (HCC)    Hypertension    Hypothyroidism    Syncope     History reviewed. No pertinent surgical history.   Home Medications:  Prior to Admission medications   Medication Sig Start Date End Date Taking? Authorizing Provider  acetaminophen (TYLENOL) 500 MG tablet Take 1,000 mg by mouth 3 (three) times daily. 0800, 1400, 2000.   Yes [provider]  allopurinol (ZYLOPRIM) 100 MG tablet Take 100 mg by mouth daily.   Yes [provider]  amLODipine (NORVASC) 5 MG tablet Take 2.5-5 mg by mouth See admin instructions. Take 2.5mg  by mouth in the morning, then take 5mg  tablet by mouth in the evening.   Yes [provider]  Cholecalciferol (VITAMIN D) 50 MCG (2000 UT) tablet Take 2,000 Units by mouth daily.   Yes [provider]    diclofenac sodium (VOLTAREN) 1 % GEL Apply 2 g topically 3 (three) times daily. To knees, hips, and lower back.   Yes [provider]  fluticasone (FLONASE) 50 MCG/ACT nasal spray Place 1 spray into both nostrils daily.   Yes [provider]  latanoprost (XALATAN) 0.005 % ophthalmic solution Place 1 drop into both eyes at bedtime.   Yes [provider]  levothyroxine (SYNTHROID) 150 MCG tablet Take 150 mcg by mouth daily before breakfast.   Yes [provider]  Multiple Vitamins-Iron (DAILY VITE MULTIVITAMIN/IRON) TABS Take 1 tablet by mouth daily.   Yes [provider]  OLANZapine (ZYPREXA) 7.5 MG tablet Take 7.5 mg by mouth at bedtime.   Yes [provider]  polyethylene glycol (MIRALAX / GLYCOLAX) packet Take 17 g by mouth daily. 05/17/18  Yes Dorrell, Andree Elk, MD    Inpatient Medications: Scheduled Meds:  cholecalciferol  2,000 Units Oral Daily   diclofenac sodium  2 g Topical TID   enoxaparin (LOVENOX) injection  30 mg Subcutaneous Q24H   fluticasone  1 spray Each Nare Daily   hydroxypropyl methylcellulose / hypromellose  1 drop Both Eyes TID   latanoprost  1 drop Both Eyes QHS   levothyroxine  125 mcg Oral Q0600   multivitamins with iron  1 tablet Oral Daily   OLANZapine  7.5 mg Oral QHS   polyethylene glycol  17 g Oral Daily   Continuous Infusions:  cefTRIAXone (ROCEPHIN)  IV 1 g (03/09/19 1131)   PRN Meds:   Allergies:    Allergies  Allergen Reactions   Codeine Anaphylaxis   Sulfa Antibiotics Anaphylaxis    Social History:   Social History   Socioeconomic History   Marital status: Widowed    Spouse name: Not on file   Number of children: Not on file   Years of education: Not on file   Highest education level: Not on file  Occupational History   Not on file  Social Needs   Financial resource strain: Not on file   Food insecurity    Worry: Not on file    Inability: Not on file    Transportation needs    Medical: Not on file    Non-medical: Not on file  Tobacco Use   Smoking status: Former Smoker   Smokeless tobacco: Current User  Substance and Sexual Activity   Alcohol use: Never    Frequency: Never   Drug use: Never   Sexual activity: Not on file  Lifestyle   Physical activity    Days per week: Not on file    Minutes per session: Not on file   Stress: Not on file  Relationships   Social connections    Talks on phone: Not on file    Gets together: Not on file    Attends religious service: Not on file    Active member of club or organization: Not on file    Attends meetings of clubs or organizations: Not on file    Relationship status: Not on file   Intimate partner violence    Fear of current or ex partner: Not on file    Emotionally abused: Not on file    Physically abused: Not on file    Forced sexual activity: Not on file  Other Topics Concern   Not on file  Social History Narrative   Not on file    Family History:    Family History  Family history unknown: Yes     ROS:  Please see the history of present illness.   All other ROS reviewed and negative.     Physical Exam/Data:   Vitals:   03/09/19 2136 03/10/19 0422 03/10/19 0423 03/10/19 0434  BP: (!) 154/75  (!) 162/70   Pulse:   (!) 36 (!) 52  Resp:   18   Temp:   98.7 F (37.1 C)   TempSrc:      SpO2:   99%   Weight:  78.9 kg    Height:        Intake/Output Summary (Last 24 hours) at 03/10/2019 0857 Last data filed at 03/10/2019 0656 Gross per 24 hour  Intake 1600 ml  Output 950 ml  Net 650 ml   Last 3 Weights 03/10/2019 03/09/2019 03/08/2019  Weight (lbs) 173 lb 15.1 oz 176 lb 4.8 oz 174 lb 8 oz  Weight (kg) 78.9 kg 79.969 kg 79.153 kg     Body mass index is 28.08 kg/m.  General:  Well nourished, well developed, in no acute distress HEENT: normal Lymph: no adenopathy Neck: no JVD Endocrine:  No thryomegaly Vascular: No carotid bruits; FA pulses 2+  bilaterally without bruits  Cardiac:  normal S1, S2; RRR; 1/6 soft systolic murmur  Lungs:  clear to auscultation bilaterally, no wheezing, rhonchi or rales  Abd: soft, nontender, no hepatomegaly  Ext: no edema Musculoskeletal:  No deformities, BUE and BLE strength normal and equal Skin: warm and  dry  Neuro:  CNs 2-12 intact, no focal abnormalities noted Psych:  Normal affect   EKG:  The EKG was personally reviewed and demonstrates:  nsr with RBBB, Left axis and mobitz 1, second degree AV block Telemetry:  Telemetry was personally reviewed and demonstrates:  nsr with mobitz 1 as well as 2:1 AV block   Relevant CV Studies: none  Laboratory Data:  High Sensitivity Troponin:   Recent Labs  Lab 03/08/19 1032 03/08/19 1210  TROPONINIHS 27* 28*     Chemistry Recent Labs  Lab 03/08/19 1550 03/09/19 0432 03/10/19 0524  NA 140 140 142  K 4.6 4.5 4.1  CL 106 108 111  CO2 21* 21* 20*  GLUCOSE 129* 98 96  BUN 43* 43* 34*  CREATININE 2.81* 2.79* 2.33*  CALCIUM 10.0 9.6 9.1  GFRNONAA 15* 15* 19*  GFRAA 18* 18* 22*  ANIONGAP 13 11 11     No results for input(s): PROT, ALBUMIN, AST, ALT, ALKPHOS, BILITOT in the last 168 hours. Hematology Recent Labs  Lab 03/08/19 1032 03/09/19 0432  WBC 7.0 6.6  RBC 3.75* 3.64*  HGB 11.1* 11.0*  HCT 34.9* 33.8*  MCV 93.1 92.9  MCH 29.6 30.2  MCHC 31.8 32.5  RDW 13.9 13.9  PLT 174 178   BNPNo results for input(s): BNP, PROBNP in the last 168 hours.  DDimer No results for input(s): DDIMER in the last 168 hours.   Radiology/Studies:  Mr Brain Wo Contrast  Result Date: 03/08/2019 CLINICAL DATA:  Encephalopathy EXAM: MRI HEAD WITHOUT CONTRAST TECHNIQUE: Multiplanar, multiecho pulse sequences of the brain and surrounding structures were obtained without intravenous contrast. COMPARISON:  None. FINDINGS: BRAIN: There is no acute infarct, acute hemorrhage or extra-axial collection. The white matter signal is normal for the patient's age.  There is generalized atrophy without lobar predilection. The midline structures are normal. VASCULAR: The major intracranial arterial and venous sinus flow voids are normal. Susceptibility-sensitive sequences show no chronic microhemorrhage or superficial siderosis. SKULL AND UPPER CERVICAL SPINE: Calvarial bone marrow signal is normal. There is no skull base mass. The visualized upper cervical spine and soft tissues are normal. SINUSES/ORBITS: There are no fluid levels or advanced mucosal thickening. The mastoid air cells and middle ear cavities are free of fluid. The orbits are normal. IMPRESSION: 1. No acute intracranial abnormality. 2. Generalized atrophy without lobar predilection. Electronically Signed   By: Ulyses Jarred M.D.   On: 03/08/2019 23:22   US Renal  Result Date: 03/09/2019 CLINICAL DATA:  Acute kidney injury. EXAM: RENAL / URINARY TRACT ULTRASOUND COMPLETE COMPARISON:  12/29/2015 FINDINGS: Right Kidney: Renal measurements: 12.4 x 5.1 x 4.3 cm = volume: 141 mL. Increased cortical echogenicity. No hydronephrosis. Small echogenic focus in the mid kidney may represent a small nonobstructing calculus or vascular calcification. Simple cysts include a 2 cm upper pole and 3.5 cm lower pole cyst. Both have a benign appearance. Left Kidney: Renal measurements: 9.3 x 5.1 x 4.0 cm = volume: 100 mL. Increased cortical echogenicity. No hydronephrosis. Bladder: Decompressed. Other: None. IMPRESSION: Both kidneys demonstrate increased cortical echogenicity consistent with chronic kidney disease. No hydronephrosis. Electronically Signed   By: Aletta Edouard M.D.   On: 03/09/2019 09:32   Dg Chest Port 1 View  Result Date: 03/08/2019 CLINICAL DATA:  Syncope EXAM: PORTABLE CHEST 1 VIEW COMPARISON:  05/15/2018 FINDINGS: Heart size and mediastinal contours are stable with signs of cardiac enlargement. No signs of consolidation, pleural effusion or interstitial thickening. No acute bone finding. IMPRESSION:  Signs  of cardiomegaly without acute cardiopulmonary disease. Electronically Signed   By: Zetta Bills M.D.   On: 03/08/2019 10:47    Assessment and Plan:   1. 2:1 AV block - the patient has an indication for a PPM and her fall may have been causes by progressive AV block. I will discuss with her daughter. 2. Dementia - this is the main issue. This morning she is quite appropriate but it sounds like she has fairly severe sun downing and if this is so then PPM might be contra-indicated and PPM would not change her long term prognosis which I suspect would be related to her dementia.      For questions or updates, please contact [place] Please consult [email or web address] for contact info under    Signed, Cristopher Peru, MD  03/10/2019 8:57 AM  Addendum: discussed the situation with the patient's daughter and answered all questions. (an extensive list) and she is reflecting on PPM insertion. We are available to place tomorrow. Back to SNF/memory care unit on Tuesday.  Mikle Bosworth.D.

## 2019-03-10 NOTE — Progress Notes (Signed)
   Subjective: Daisy Parker was seen and evaluated at bedside. Daughter is not at bedside this morning, but patient is more awake and interactive today. She is not in any pain. Still endorses some burning with urination.   Objective:  Vital signs in last 24 hours: Vitals:   03/09/19 2136 03/10/19 0422 03/10/19 0423 03/10/19 0434  BP: (!) 154/75  (!) 162/70   Pulse:   (!) 36 (!) 52  Resp:   18   Temp:   98.7 F (37.1 C)   TempSrc:      SpO2:   99%   Weight:  78.9 kg    Height:       General: awake, NAD CV: Regular rate and rhythm, no murmurs rubs or gallops Abd: bowel sounds present, non tender Neuro:  alert to self   Assessment/Plan:  Principal Problem:   Syncope Active Problems:   Dementia (HCC)   Mitral valve mass   Second degree AV block, Mobitz type I   Second degree AV block, Mobitz type II   RBBB  Daisy Parker is a 78 year old female with past medical history of  CKD Stage III, frontal temporal dementia (diagnosed in 2018) ,HTN, hypothyroidism , HFpEF, cataracts, and history of syncope in 2017 who presents after witnessed syncopal episode at her memory care unit.   1. #Syncope:  -Multifactorial presentation complicated by patients fronto-temporal dementia. Concern for symptomatic heart block. In addition patient found to have a UTI with some concurrent dehydration in the setting of poor PO intake which we are treating , Day 2 of Ceftriaxone.  - Telemetry reviewed which shows persistent 2:1 AV block. We have held amlodipine Obtained an MRI brain looking for a  potential embolization; MRI normal. EP consulted to discuss treatment options. - Echo concerning for mass on the MV, this could represent calcifications with degenerative MV disease. Blood cx were also obtained to exclude bacteremia which would raise concern for endocarditis, Cx NGTD at 24 hrs.  - Cardiology consulting for EKG findings and MV findings, appreciate recommendations.  Appreciate Dr.Taylor's,  Electrophysiologist, note. He discussed PPM insertion and daughter is reflecting.  - Hold tonight's DVT PPX if daughter decides to proceed with PPM insertion -  In addition Palliative Care Consult has been placed.    2. AKI on CKD III: Cr 1.79 on discharge in Feb. Likely pre-renal. Renal ultrasound negative for obstruction, cortical echogenicity supportive of CKD. Cr down trending, 2.3 today. Recorded net positive 650 ml  on admission. Will give IVF today. - Supervision with meals and snacks - LR 14ml/hr  - daily BMPs  - strict I&Os  3. Gram negative UTI - As mentioned above,  Ceftriaxone Day 2.  - F/u cultures, pending susceptibilities   4. HTN - holding amlodipine in setting of above., Given one dose overnight.  - If paged for SBP > 180 , consider hydralazine.  - SBP 154 - 185  5. Hypothyroidism  - TSH normal; continue current home dose   Dispo: Anticipated discharge in approximately 1-2 day(s) pending further medical work-up.   Tamsen Snider, MD PGY1  [phone]

## 2019-03-11 ENCOUNTER — Encounter (HOSPITAL_COMMUNITY)
Admission: EM | Disposition: A | Discharge status: Home / Self Care | Payer: Self-pay | Source: Skilled Nursing Facility | Attending: Internal Medicine

## 2019-03-11 DIAGNOSIS — I5030 Unspecified diastolic (congestive) heart failure: Secondary | ICD-10-CM

## 2019-03-11 DIAGNOSIS — N183 Chronic kidney disease, stage 3 unspecified: Secondary | ICD-10-CM

## 2019-03-11 DIAGNOSIS — Z79890 Hormone replacement therapy (postmenopausal): Secondary | ICD-10-CM

## 2019-03-11 DIAGNOSIS — E039 Hypothyroidism, unspecified: Secondary | ICD-10-CM

## 2019-03-11 DIAGNOSIS — Z79899 Other long term (current) drug therapy: Secondary | ICD-10-CM

## 2019-03-11 DIAGNOSIS — H269 Unspecified cataract: Secondary | ICD-10-CM

## 2019-03-11 DIAGNOSIS — G3109 Other frontotemporal dementia: Secondary | ICD-10-CM

## 2019-03-11 DIAGNOSIS — N390 Urinary tract infection, site not specified: Secondary | ICD-10-CM

## 2019-03-11 DIAGNOSIS — I130 Hypertensive heart and chronic kidney disease with heart failure and stage 1 through stage 4 chronic kidney disease, or unspecified chronic kidney disease: Secondary | ICD-10-CM

## 2019-03-11 DIAGNOSIS — B9620 Unspecified Escherichia coli [E. coli] as the cause of diseases classified elsewhere: Secondary | ICD-10-CM

## 2019-03-11 DIAGNOSIS — F0280 Dementia in other diseases classified elsewhere without behavioral disturbance: Secondary | ICD-10-CM

## 2019-03-11 HISTORY — PX: PACEMAKER IMPLANT: EP1218

## 2019-03-11 LAB — CBC
HCT: 27.1 % — ABNORMAL LOW (ref 36.0–46.0)
Hemoglobin: 9 g/dL — ABNORMAL LOW (ref 12.0–15.0)
MCH: 30.4 pg (ref 26.0–34.0)
MCHC: 33.2 g/dL (ref 30.0–36.0)
MCV: 91.6 fL (ref 80.0–100.0)
Platelets: 144 10*3/uL — ABNORMAL LOW (ref 150–400)
RBC: 2.96 MIL/uL — ABNORMAL LOW (ref 3.87–5.11)
RDW: 14 % (ref 11.5–15.5)
WBC: 6.2 10*3/uL (ref 4.0–10.5)
nRBC: 0 % (ref 0.0–0.2)

## 2019-03-11 LAB — BASIC METABOLIC PANEL
Anion gap: 12 (ref 5–15)
BUN: 34 mg/dL — ABNORMAL HIGH (ref 8–23)
CO2: 20 mmol/L — ABNORMAL LOW (ref 22–32)
Calcium: 8.9 mg/dL (ref 8.9–10.3)
Chloride: 110 mmol/L (ref 98–111)
Creatinine, Ser: 2.16 mg/dL — ABNORMAL HIGH (ref 0.44–1.00)
GFR calc Af Amer: 24 mL/min — ABNORMAL LOW (ref >60)
GFR calc non Af Amer: 21 mL/min — ABNORMAL LOW (ref >60)
Glucose, Bld: 121 mg/dL — ABNORMAL HIGH (ref 70–99)
Potassium: 4 mmol/L (ref 3.5–5.1)
Sodium: 142 mmol/L (ref 135–145)

## 2019-03-11 LAB — KAPPA/LAMBDA LIGHT CHAINS
Kappa free light chain: 21.8 mg/L — ABNORMAL HIGH (ref 3.3–19.4)
Kappa, lambda light chain ratio: 1.18 (ref 0.26–1.65)
Lambda free light chains: 18.4 mg/L (ref 5.7–26.3)

## 2019-03-11 LAB — SURGICAL PCR SCREEN
MRSA, PCR: NEGATIVE
Staphylococcus aureus: NEGATIVE

## 2019-03-11 SURGERY — PACEMAKER IMPLANT

## 2019-03-11 MED ORDER — SODIUM CHLORIDE 0.9 % IV SOLN
INTRAVENOUS | Status: AC
  Filled 2019-03-11: qty 2

## 2019-03-11 MED ORDER — HEPARIN (PORCINE) IN NACL 1000-0.9 UT/500ML-% IV SOLN
INTRAVENOUS | Status: DC | PRN
  Administered 2019-03-11: 500 mL

## 2019-03-11 MED ORDER — CEFAZOLIN SODIUM-DEXTROSE 2-4 GM/100ML-% IV SOLN
2.0000 g | INTRAVENOUS | Status: AC
  Administered 2019-03-11: 2 g via INTRAVENOUS

## 2019-03-11 MED ORDER — HEPARIN (PORCINE) IN NACL 1000-0.9 UT/500ML-% IV SOLN
INTRAVENOUS | Status: AC
  Filled 2019-03-11: qty 500

## 2019-03-11 MED ORDER — HYDRALAZINE HCL 20 MG/ML IJ SOLN
INTRAMUSCULAR | Status: DC | PRN
  Administered 2019-03-11: 10 mg via INTRAVENOUS

## 2019-03-11 MED ORDER — HYDRALAZINE HCL 10 MG PO TABS
10.0000 mg | ORAL_TABLET | Freq: Once | ORAL | Status: AC
  Administered 2019-03-11: 10 mg via ORAL
  Filled 2019-03-11: qty 1

## 2019-03-11 MED ORDER — HYDRALAZINE HCL 20 MG/ML IJ SOLN
INTRAMUSCULAR | Status: AC
  Filled 2019-03-11: qty 1

## 2019-03-11 MED ORDER — ACETAMINOPHEN 325 MG PO TABS
325.0000 mg | ORAL_TABLET | ORAL | Status: DC | PRN
  Administered 2019-03-11: 650 mg via ORAL
  Administered 2019-03-11: 325 mg via ORAL
  Administered 2019-03-14: 650 mg via ORAL
  Filled 2019-03-11: qty 2
  Filled 2019-03-11: qty 1
  Filled 2019-03-11: qty 2

## 2019-03-11 MED ORDER — AMLODIPINE BESYLATE 2.5 MG PO TABS: 2.5000 mg | ORAL_TABLET | ORAL | Status: DC

## 2019-03-11 MED ORDER — CEFAZOLIN SODIUM-DEXTROSE 1-4 GM/50ML-% IV SOLN
1.0000 g | Freq: Once | INTRAVENOUS | Status: AC
  Administered 2019-03-11: 1 g via INTRAVENOUS
  Filled 2019-03-11: qty 50

## 2019-03-11 MED ORDER — AMLODIPINE BESYLATE 5 MG PO TABS
5.0000 mg | ORAL_TABLET | Freq: Every day | ORAL | Status: DC
  Administered 2019-03-11: 5 mg via ORAL
  Filled 2019-03-11: qty 1

## 2019-03-11 MED ORDER — ONDANSETRON HCL 4 MG/2ML IJ SOLN: 4.0000 mg | Freq: Four times a day (QID) | INTRAMUSCULAR | Status: DC | PRN

## 2019-03-11 MED ORDER — CEFAZOLIN SODIUM-DEXTROSE 2-4 GM/100ML-% IV SOLN
INTRAVENOUS | Status: AC
  Filled 2019-03-11: qty 100

## 2019-03-11 MED ORDER — SODIUM CHLORIDE 0.9 % IV SOLN
INTRAVENOUS | Status: DC
  Administered 2019-03-11: 12:00:00 via INTRAVENOUS

## 2019-03-11 MED ORDER — LIDOCAINE HCL (PF) 1 % IJ SOLN
INTRAMUSCULAR | Status: AC
  Filled 2019-03-11: qty 60

## 2019-03-11 MED ORDER — LIDOCAINE HCL 1 % IJ SOLN
INTRAMUSCULAR | Status: AC
  Filled 2019-03-11: qty 60

## 2019-03-11 MED ORDER — SODIUM CHLORIDE 0.9 % IV SOLN
80.0000 mg | INTRAVENOUS | Status: AC
  Administered 2019-03-11: 80 mg

## 2019-03-11 MED ORDER — LIDOCAINE HCL (PF) 1 % IJ SOLN
INTRAMUSCULAR | Status: DC | PRN
  Administered 2019-03-11: 60 mL

## 2019-03-11 SURGICAL SUPPLY — 7 items
CABLE SURGICAL S-101-97-12 (CABLE) ×3 IMPLANT
LEAD TENDRIL MRI 52CM LPA1200M (Lead) ×3 IMPLANT
LEAD TENDRIL MRI 58CM LPA1200M (Lead) ×3 IMPLANT
PACEMAKER ASSURITY DR-RF (Pacemaker) ×3 IMPLANT
PAD PRO RADIOLUCENT 2001M-C (PAD) ×3 IMPLANT
SHEATH 8FR PRELUDE SNAP 13 (SHEATH) ×6 IMPLANT
TRAY PACEMAKER INSERTION (PACKS) ×3 IMPLANT

## 2019-03-11 NOTE — Progress Notes (Signed)
Patient ID: Daisy Parker, female   DOB: 04-22-1940, 78 y.o.   MRN: [id number]  This NP visited patient at the bedside as a follow up to  yesterday's [place].  Patient  is awake and alert and voices no complaints.  Spoke to daughter by phone and she reiterates that the decision has been made to move forward with pacemaker placement.   Questions and concerns addressed.  Discussed with family  the importance of continued conversation with the  medical providers regarding overall plan of care and treatment options,  ensuring decisions are within the context of the patients values and GOCs.  Recommend OP palliative services on discharge.  Questions and concerns addressed   Discussed with [name]   Total time spent on the unit was 20 minutes  Greater than 50% of the time was spent in counseling and coordination of care  Wadie Lessen NP  Palliative Medicine Team Team Phone # [phone] Pager [phone]

## 2019-03-11 NOTE — Progress Notes (Signed)
Internal Medicine Attending:   I saw and examined the patient. I reviewed Dr Lonzo Candy note and I agree with the resident's findings and plan as documented in the resident's note.  No acute events, remains bradycardic on exam. To go for PPM placement this afternoon.

## 2019-03-11 NOTE — TOC Initial Note (Signed)
Transition of Care [facility]) - Initial/Assessment Note    Patient Details  Name: Daisy Parker MRN: [id number] Date of Birth: 08-07-1940  [facility]) CM/SW Contact:    Gelene Mink, [place] Phone Number: 03/11/2019, 12:44 PM  Clinical Narrative:                  Patient is from [facility]. Patient is going to need home health.   CSW called and spoke with Tammy at [facility]. They can take the patient back on Tuesday. They will need an updated COVID and home health face to face orders. They contract with [facility]. Please fax discharge summary and FL2 with DC meds to [phone] and report can be called to [phone].   CSW informed MD and he will put home health orders in.   CSW called Tiffany with [facility]. They are able to provide PT and OT. They will need orders.   CSW will continue to follow and assist with TOC needs.    Expected Discharge Plan: Assisted Living Barriers to Discharge: Continued Medical Work up   Patient Goals and CMS Choice Patient states their goals for this hospitalization and ongoing recovery are:: Pt wants to go back to her assisted living facility      Expected Discharge Plan and Services Expected Discharge Plan: Assisted Living In-house Referral: Clinical Social Work   Post Acute Care Choice: [place] arrangements for the past 2 months: [place]: PT, OT [place] Agency: [facility] (formerly [employer]) Date [place]: 03/11/19 Time [place]: 1244 Representative spoke with at [place]: [place] Arrangements/Services Living arrangements for the past 2 months: [place] Lives with:: Facility Resident Patient language and need for interpreter reviewed:: No Do you feel safe going back to the place where you live?: Yes      Need for Family Participation in  Patient Care: Yes (Comment) Care giver support system in place?: Yes (comment) Current home services: DME Criminal Activity/Legal Involvement Pertinent to Current Situation/Hospitalization: No - Comment as needed  Activities of Daily Living Home Assistive Devices/Equipment: Bedside commode/3-in-1 ADL Screening (condition at time of admission) Patient's cognitive ability adequate to safely complete daily activities?: No Is the patient deaf or have difficulty hearing?: No Does the patient have difficulty seeing, even when wearing glasses/contacts?: No Does the patient have difficulty concentrating, remembering, or making decisions?: Yes Patient able to express need for assistance with ADLs?: No Does the patient have difficulty dressing or bathing?: No Independently performs ADLs?: No Communication: Independent Dressing (OT): Needs assistance Is this a change from baseline?: Pre-admission baseline Grooming: Needs assistance Is this a change from baseline?: Pre-admission baseline Feeding: Needs assistance Is this a change from baseline?: Pre-admission baseline Bathing: Needs assistance Is this a change from baseline?: Pre-admission baseline Toileting: Needs assistance Is this a change from baseline?: Pre-admission baseline In/Out Bed: Needs assistance Is this a change from baseline?: Pre-admission baseline Walks in Home: Needs assistance Is this a change from baseline?: Pre-admission baseline Does the patient have difficulty walking or climbing stairs?: No Weakness of Legs: Both Weakness of Arms/Hands: Both  Permission Sought/Granted Permission sought to share information with : Case Manager Permission granted to share information with : Yes, Verbal Permission Granted  Permission granted to share info w AGENCY: [facility]        Emotional Assessment Appearance:: Appears stated age Attitude/Demeanor/Rapport: Unable to Assess Affect (typically observed): Unable to  Assess Orientation: : Oriented to Self Alcohol / Substance Use: Not Applicable Psych Involvement: No (comment)  Admission diagnosis:  AKI (acute kidney injury) ([place]) [N17.9] Syncope, unspecified syncope type [R55] Patient Active Problem List   Diagnosis Date Noted  . Palliative care by specialist   . DNR (do not resuscitate) discussion   . Bradycardia   . Mitral valve mass   . Second degree AV block, Mobitz type I   . Second degree AV block, Mobitz type II   . RBBB   . Syncope 03/08/2019  . AKI (acute kidney injury) ([place]) 05/16/2018  . Encephalopathy acute 05/14/2018  . Dementia ([place]) 05/14/2018  . HTN (hypertension) 05/14/2018  . CKD (chronic kidney disease) 05/14/2018   PCP:  Patient, No Pcp Per Pharmacy:   Ardeth Perfect, [place] [id number] [place] [address] Phone: [phone] Fax: [phone]     Social Determinants of Health (SDOH) Interventions    Readmission Risk Interventions No flowsheet data found.

## 2019-03-11 NOTE — Progress Notes (Signed)
Orthopedic Tech Progress Note Patient Details:  Daisy Parker October 30, 1940 [id number] RN said patient has on sling Patient ID: Daisy Parker, female   DOB: Nov 18, 1940, 78 y.o.   MRN: [id number]   Janit Pagan 03/11/2019, 2:45 PM

## 2019-03-11 NOTE — Discharge Instructions (Signed)
After Your Pacemaker   You have a St. Jude Pacemaker   Do not lift your arm above shoulder height for 1 week after your procedure. After 7 days, you may progress as below.     Monday March 18, 2019  Tuesday March 19, 2019 Wednesday March 20, 2019 Thursday March 21, 2019    Do not lift, push, pull, or carry anything over 10 pounds with the affected arm until 6 weeks (Monday April 22, 2019) after your procedure.    Do not drive until your wound check, or until instructed by your healthcare provider that you are safe to do so.    Monitor your pacemaker site for redness, swelling, and drainage. Call the device clinic at [phone] if you experience these symptoms or fever/chills.   If your incision is sealed with Steri-strips or staples, you may shower 7 days after your procedure. Do not remove the steri-strips or let the shower hit directly on your site. You may wash around your site with soap and water. If your incision is closed with Dermabond/Surgical glue. You may shower 1 day after your pacemaker implant and wash around the site with soap and water. Avoid lotions, ointments, or perfumes over your incision until it is well-healed.   You may use a hot tub or a pool AFTER your wound check appointment if the incision is completely closed.   Your Pacemaker may be MRI compatible. We will discuss this at your first follow up/wound check. .    Remote monitoring is used to monitor your pacemaker from home. This monitoring is scheduled every 91 days by our office. It allows [place] to keep an eye on the functioning of your device to ensure it is working properly. You will routinely see your Electrophysiologist annually (more often if necessary).    Pacemaker Implantation, Care After This sheet gives you information about how to care for yourself after your procedure. Your health care provider may also give you more specific instructions. If you have problems or questions, contact  your health care provider. What can I expect after the procedure? After the procedure, it is common to have:  Mild pain.  Slight bruising.  Some swelling over the incision.  A slight bump over the skin where the device was placed. Sometimes, it is possible to feel the device under the skin. This is normal.  You should received your Pacemaker ID card within 4-8 weeks. Follow these instructions at home: Medicines  Take over-the-counter and prescription medicines only as told by your health care provider.  If you were prescribed an antibiotic medicine, take it as told by your health care provider. Do not stop taking the antibiotic even if you start to feel better. Wound care     Do not remove the bandage on your chest until directed to do so by your health care provider.  After your bandage is removed, you may see pieces of tape called skin adhesive strips over the area where the cut was made (incision site). Let them fall off on their own.  Check the incision site every day to make sure it is not infected, bleeding, or starting to pull apart.  Do not use lotions or ointments near the incision site unless directed to do so.  Keep the incision area clean and dry for 7 days after the procedure or as directed by your health care provider. It takes several weeks for the incision site to completely heal.  Do not take baths, swim, or use  a hot tub for 7-10 days or as otherwise directed by your health care provider. Activity  Do not drive or use heavy machinery while taking prescription pain medicine.  Do not drive for 24 hours if you were given a medicine to help you relax (sedative).  Check with your health care provider before you start to drive or play sports.  Avoid sudden jerking, pulling, or chopping movements that pull your upper arm far away from your body. Avoid these movements for at least 6 weeks or as long as told by your health care provider.  Do not lift your upper  arm above your shoulders for at least 6 weeks or as long as told by your health care provider. This means no tennis, golf, or swimming.  You may go back to work when your health care provider says it is okay. Pacemaker care  You may be shown how to transfer data from your pacemaker through the phone to your health care provider.  Always let all health care providers know about your pacemaker before you have any medical procedures or tests.  Wear a medical ID bracelet or necklace stating that you have a pacemaker. Carry a pacemaker ID card with you at all times.  Your pacemaker battery will last for 5-15 years. Routine checks by your health care provider will let the health care provider know when the battery is starting to run down. The pacemaker will need to be replaced when the battery starts to run down.  Do not use amateur Chief of Staff. Other electrical devices are safe to use, including power tools, lawn mowers, and speakers. If you are unsure of whether something is safe to use, ask your health care provider.  When using your cell phone, hold it to the ear opposite the pacemaker. Do not leave your cell phone in a pocket over the pacemaker.  Avoid places or objects that have a strong electric or magnetic field, including: ? Airport Herbalist. When at the airport, let officials know that you have a pacemaker. ? Power plants. ? Large electrical generators. ? Radiofrequency transmission towers, such as cell phone and radio towers. General instructions  Weigh yourself every day. If you suddenly gain weight, fluid may be building up in your body.  Keep all follow-up visits as told by your health care provider. This is important. Contact a health care provider if:  You gain weight suddenly.  Your legs or feet swell.  It feels like your heart is fluttering or skipping beats (heart palpitations).  You have chills or a fever.  You have more  redness, swelling, or pain around your incisions.  You have more fluid or blood coming from your incisions.  Your incisions feel warm to the touch.  You have pus or a bad smell coming from your incisions. Get help right away if:  You have chest pain.  You have trouble breathing or are short of breath.  You become extremely tired.  You are light-headed or you faint. This information is not intended to replace advice given to you by your health care provider. Make sure you discuss any questions you have with your health care provider.

## 2019-03-11 NOTE — Progress Notes (Signed)
   Subjective:  Patient is alert to self and knows she is in a hospital. Aware she is going for pacemaker placement around 1.   Objective:  Vital signs in last 24 hours: Vitals:   03/10/19 0434 03/10/19 1327 03/10/19 1953 03/11/19 0519  BP:  (!) 165/90 (!) 161/69 (!) 168/62  Pulse: (!) 52 75 (!) 56 (!) 52  Resp:  18 18 18   Temp:  98.8 F (37.1 C) 98.4 F (36.9 C) 98.6 F (37 C)  TempSrc:  Oral Oral Oral  SpO2:  98% 98% 98%  Weight:    77.6 kg  Height:       General: awake, NAD CV: Bradycardic, regular rhythm, no murmurs rubs or gallops Abd: bowel sounds present, non tender Neuro:  alert to self   Assessment/Plan:  Principal Problem:   Syncope Active Problems:   Dementia (HCC)   Mitral valve mass   Second degree AV block, Mobitz type I   Second degree AV block, Mobitz type II   RBBB   Palliative care by specialist   DNR (do not resuscitate) discussion   Bradycardia  Ellianna Hanners is a 78 year old female with past medical history of  CKD Stage III, frontal temporal dementia (diagnosed in 2018) ,HTN, hypothyroidism , HFpEF, cataracts, and history of syncope in 2017 who presents after witnessed syncopal episode at her memory care unit.   1. #Syncope:  -Multifactorial presentation complicated by patients fronto-temporal dementia. Concern for symptomatic heart block. Telemetry reviewed which shows persistent 2:1 AV block. We have held amlodipine Obtained an MRI brain looking for a  potential embolization; MRI normal. - Cardiology consulting for EKG findings and MV findings, appreciate recommendations. - Echo concerning for mass on the MV, this could represent calcifications with degenerative MV disease. Blood cx were also obtained to exclude bacteremia which would raise concern for endocarditis, Cx NGTD at 3 days.  - Follow blood cx - EP to take patient for PPM today - Appreciate palliative care consult. Patient is going for pacemaker placement and recommendation for  outpatient palliative care consult at discharge.   2. AKI on CKD III: Cr 1.79 on discharge in Feb. Likely pre-renal. Renal ultrasound negative for obstruction, cortical echogenicity supportive of CKD. Continue to give IV fluids and trend Cr.  -Cr improving to 2.16 - Supervision with meals and snacks - LR 192ml/hr  - daily BMPs  - strict I&Os  3. E.Coli UTI - Day 3/3 of  Ceftriaxone. Stop date 11/30    4. HTN - holding amlodipine in setting of above., Given one dose overnight.  - If paged for SBP > 180 , consider hydralazine.  - SBP 154 - 185  5. Hypothyroidism  - TSH normal; continue current home dose   Dispo: Anticipated discharge in approximately 1-2 day(s) pending further medical work-up.   Tamsen Snider, MD PGY1  [phone]

## 2019-03-11 NOTE — Progress Notes (Signed)
Patient came back from cath lab with no IV pump, nor IV fluids and she is supposed to be getting LR at 177ml/hour. Internal medicine paged and they still want the patient to be on fluids. Awaiting for IV pump to be delivered to floor.

## 2019-03-11 NOTE — Progress Notes (Addendum)
Electrophysiology Rounding Note  Patient Name: Daisy Parker Date of Encounter: 03/11/2019  Electrophysiologist: New    Subjective   The patient is feeling down this am. At this time, the patient denies chest pain or shortness of breath.  Inpatient Medications    Scheduled Meds:  cholecalciferol  2,000 Units Oral Daily   diclofenac sodium  2 g Topical TID   enoxaparin (LOVENOX) injection  30 mg Subcutaneous Q24H   fluticasone  1 spray Each Nare Daily   hydroxypropyl methylcellulose / hypromellose  1 drop Both Eyes TID   latanoprost  1 drop Both Eyes QHS   levothyroxine  125 mcg Oral Q0600   multivitamins with iron  1 tablet Oral Daily   OLANZapine  7.5 mg Oral QHS   polyethylene glycol  17 g Oral Daily   Continuous Infusions:  cefTRIAXone (ROCEPHIN)  IV 1 g (03/10/19 1147)   lactated ringers 125 mL/hr at 03/11/19 0526   PRN Meds:    Vital Signs    Vitals:   03/10/19 0434 03/10/19 1327 03/10/19 1953 03/11/19 0519  BP:  (!) 165/90 (!) 161/69 (!) 168/62  Pulse: (!) 52 75 (!) 56 (!) 52  Resp:  18 18 18   Temp:  98.8 F (37.1 C) 98.4 F (36.9 C) 98.6 F (37 C)  TempSrc:  Oral Oral Oral  SpO2:  98% 98% 98%  Weight:    77.6 kg  Height:        Intake/Output Summary (Last 24 hours) at 03/11/2019 0709 Last data filed at 03/11/2019 0547 Gross per 24 hour  Intake 868.27 ml  Output 1550 ml  Net -681.73 ml   Filed Weights   03/09/19 0420 03/10/19 0422 03/11/19 0519  Weight: 80 kg 78.9 kg 77.6 kg    Physical Exam    GEN- The patient is well appearing, alert and oriented x 3 today.   Head- normocephalic, atraumatic Eyes-  Sclera clear, conjunctiva pink Ears- hearing intact Oropharynx- clear Neck- supple Lungs- Clear to ausculation bilaterally, normal work of breathing Heart- Regular rate and rhythm, no murmurs, rubs or gallops GI- soft, NT, ND, + BS Extremities- no clubbing, cyanosis, or edema Skin- no rash or lesion Psych- euthymic mood,  full affect Neuro- strength and sensation are intact  Labs    CBC Recent Labs    03/08/19 1032 03/09/19 0432 03/11/19 0459  WBC 7.0 6.6 6.2  NEUTROABS 5.7  --   --   HGB 11.1* 11.0* 9.0*  HCT 34.9* 33.8* 27.1*  MCV 93.1 92.9 91.6  PLT 174 178 [id number]*   Basic Metabolic Panel Recent Labs    03/10/19 0524 03/11/19 0459  NA 142 142  K 4.1 4.0  CL 111 110  CO2 20* 20*  GLUCOSE 96 121*  BUN 34* 34*  CREATININE 2.33* 2.16*  CALCIUM 9.1 8.9   Liver Function Tests No results for input(s): AST, ALT, ALKPHOS, BILITOT, PROT, ALBUMIN in the last 72 hours. No results for input(s): LIPASE, AMYLASE in the last 72 hours. Cardiac Enzymes No results for input(s): CKTOTAL, CKMB, CKMBINDEX, TROPONINI in the last 72 hours. BNP Invalid input(s): POCBNP D-Dimer No results for input(s): DDIMER in the last 72 hours. Hemoglobin A1C No results for input(s): HGBA1C in the last 72 hours. Fasting Lipid Panel No results for input(s): CHOL, HDL, LDLCALC, TRIG, CHOLHDL, LDLDIRECT in the last 72 hours. Thyroid Function Tests Recent Labs    03/08/19 1550  TSH 0.510    Telemetry    2:1 AV block with  HRs in 30s-50s (personally reviewed)  Radiology    US Renal  Result Date: 03/09/2019 CLINICAL DATA:  Acute kidney injury. EXAM: RENAL / URINARY TRACT ULTRASOUND COMPLETE COMPARISON:  12/29/2015 FINDINGS: Right Kidney: Renal measurements: 12.4 x 5.1 x 4.3 cm = volume: 141 mL. Increased cortical echogenicity. No hydronephrosis. Small echogenic focus in the mid kidney may represent a small nonobstructing calculus or vascular calcification. Simple cysts include a 2 cm upper pole and 3.5 cm lower pole cyst. Both have a benign appearance. Left Kidney: Renal measurements: 9.3 x 5.1 x 4.0 cm = volume: 100 mL. Increased cortical echogenicity. No hydronephrosis. Bladder: Decompressed. Other: None. IMPRESSION: Both kidneys demonstrate increased cortical echogenicity consistent with chronic kidney disease. No  hydronephrosis. Electronically Signed   By: Aletta Edouard M.D.   On: 03/09/2019 09:32     Patient Profile     Daisy Parker is a 78 y.o. female with a hx of dementia and falls  who is being seen today for the evaluation of mobitz 2, second degree AV block at the request of Dr. Radford Pax.  Assessment & Plan    1. 2:1 AV block Pt indicated for pacemaker, and thought that falls are due to progressive AV block.  Discussed with family at length and thought appropriate to proceed with pacing.  Laquentin Loudermilk plan today, with discharge to SNF tomorrow.  2. Dementia Main barrier to PPM placement.   Dimitris Shanahan confirm with daughter that she is OK with proceeding, and if agrees, plan with Pacemaker placement at the next available time.    For questions or updates, please contact [place] Please consult [email or web address] for contact info under Cardiology/STEMI.  Signed, Shirley Friar, PA-C  03/11/2019, 7:09 AM   I have seen and examined this patient with Oda Kilts.  Agree with above, note added to reflect my findings.  On exam, bradycardic, no murmurs, lungs clear. Admitted to the hospital with falls. Found to have mobitz 1 AV block with intermittent 2:1 AV block. No reversible causes. Have discussed with family. Ranya Fiddler plan for pacemaker implant.    Quita Mcgrory M. Milo Schreier MD 03/11/2019 10:01 AM

## 2019-03-11 NOTE — Progress Notes (Signed)
Patient's blood pressure is elevated, internal medicine paged and notified. Awaiting for new orders.

## 2019-03-11 NOTE — H&P (Signed)
Daisy Parker has presented today for surgery, with the diagnosis of second degree AV block.  The various methods of treatment have been discussed with the patient and family. After consideration of risks, benefits and other options for treatment, the patient has consented to  Procedure(s): Pacemaker implant as a surgical intervention .  Risks include but not limited to bleeding, tamponade, infection, pneumothorax, among others. The patient's history has been reviewed, patient examined, no change in status, stable for surgery.  I have reviewed the patient's chart and labs.  Questions were answered to the patient's satisfaction.    Evani Shrider Curt Bears, MD 03/11/2019 12:38 PM

## 2019-03-11 NOTE — NC FL2 (Signed)
[place] LEVEL OF CARE SCREENING TOOL     IDENTIFICATION  Patient Name: Daisy Parker Birthdate: Jan 07, 1941 Sex: female Admission Date (Current Location): 03/08/2019  [place] and [place] Number:  Kathleen Argue [id number] [place] and Address:  [facility] [place], [place] [address] [place], [place] [address]      Provider Number: [id number]  Attending Physician Name and Address:  Lucious Groves, DO  Relative Name and Phone Number:  Kamira Robbins, Daughter, [phone]    Current Level of Care: Hospital Recommended Level of Care: [place] Prior Approval Number:    Date Approved/Denied:   PASRR Number:    Discharge Plan:      Current Diagnoses: Patient Active Problem List   Diagnosis Date Noted  . Palliative care by specialist   . DNR (do not resuscitate) discussion   . Bradycardia   . Mitral valve mass   . Second degree AV block, Mobitz type I   . Second degree AV block, Mobitz type II   . RBBB   . Syncope 03/08/2019  . AKI (acute kidney injury) ([place]) 05/16/2018  . Encephalopathy acute 05/14/2018  . Dementia ([place]) 05/14/2018  . HTN (hypertension) 05/14/2018  . CKD (chronic kidney disease) 05/14/2018    Orientation RESPIRATION BLADDER Height & Weight     Self  Normal Continent Weight: 171 lb 1.2 oz (77.6 kg) Height:  5\' 6"  (167.6 cm)  BEHAVIORAL SYMPTOMS/MOOD NEUROLOGICAL BOWEL NUTRITION STATUS      Continent Diet(Regular diet, thin liquids, needs assistance)  AMBULATORY STATUS COMMUNICATION OF NEEDS Skin   Limited Assist Verbally Bruising(on hand/arm)                       Personal Care Assistance Level of Assistance  Bathing, Feeding, Dressing, Total care Bathing Assistance: Limited assistance Feeding assistance: Limited assistance Dressing Assistance: Limited assistance     Functional Limitations Info  Sight, Hearing, Speech Sight Info: Adequate Hearing Info: Adequate Speech Info: Adequate     SPECIAL CARE FACTORS FREQUENCY  PT (By licensed PT), OT (By licensed OT)     PT Frequency: 2x-3x/wk OT Frequency: 2x-3x/wk            Contractures Contractures Info: Not present    Additional Factors Info  Code Status, Psychotropic, Allergies Code Status Info: Full Code Allergies Info: Codeine, Sulfa Antibiotics Psychotropic Info: zyprexa 7.5mg  at bedtime         Current Medications (03/11/2019):  This is the current hospital active medication list Current Facility-Administered Medications  Medication Dose Route Frequency Provider Last Rate Last Dose  . 0.9 %  sodium chloride infusion   Intravenous Continuous Shirley Friar, PA-C 50 mL/hr at 03/11/19 1151    . ceFAZolin (ANCEF) IVPB 2g/100 mL premix  2 g Intravenous On Call Shirley Friar, PA-C      . cholecalciferol (VITAMIN D3) tablet 2,000 Units  2,000 Units Oral Daily [place], Nila Nephew D, DO   2,000 Units at 03/11/19 0849  . diclofenac sodium (VOLTAREN) 1 % transdermal gel 2 g  2 g Topical TID [place], Carley D, DO   2 g at 03/11/19 0851  . enoxaparin (LOVENOX) injection 30 mg  30 mg Subcutaneous Q24H Mosetta Anis, MD   30 mg at 03/10/19 1734  . fluticasone (FLONASE) 50 MCG/ACT nasal spray 1 spray  1 spray Each Nare Daily [place], Carley D, DO   1 spray at 03/11/19 0852  . gentamicin (GARAMYCIN) 80 mg in sodium  chloride 0.9 % 500 mL irrigation  80 mg Irrigation On Call Shirley Friar, PA-C      . hydroxypropyl methylcellulose / hypromellose (ISOPTO TEARS / GONIOVISC) 2.5 % ophthalmic solution 1 drop  1 drop Both Eyes TID Donnamae Jude, RPH   1 drop at 03/11/19 [id number]  . lactated ringers infusion   Intravenous Continuous Modena Nunnery D, DO 125 mL/hr at 03/11/19 0526    . latanoprost (XALATAN) 0.005 % ophthalmic solution 1 drop  1 drop Both Eyes QHS [place], Carley D, DO   1 drop at 03/10/19 2033  . levothyroxine (SYNTHROID) tablet 125 mcg  125 mcg Oral Q0600 Modena Nunnery D,  DO   125 mcg at 03/11/19 0520  . multivitamins with iron tablet 1 tablet  1 tablet Oral Daily [place], Carley D, DO   1 tablet at 03/11/19 0849  . OLANZapine (ZYPREXA) tablet 7.5 mg  7.5 mg Oral QHS [place], Carley D, DO   7.5 mg at 03/10/19 2029  . polyethylene glycol (MIRALAX / GLYCOLAX) packet 17 g  17 g Oral Daily [place], Carley D, DO   17 g at 03/10/19 [id number]     Discharge Medications: Please see discharge summary for a list of discharge medications.  Relevant Imaging Results:  Relevant Lab Results:   Additional Information SSN: [id number]; COVID negative on 03/08/2019  Adel Burch B Kandace Elrod, LCSWA

## 2019-03-12 ENCOUNTER — Encounter (HOSPITAL_COMMUNITY): Payer: Self-pay | Admitting: Cardiology

## 2019-03-12 ENCOUNTER — Inpatient Hospital Stay (HOSPITAL_COMMUNITY): Payer: Medicare Other

## 2019-03-12 DIAGNOSIS — Z950 Presence of cardiac pacemaker: Secondary | ICD-10-CM

## 2019-03-12 DIAGNOSIS — F0281 Dementia in other diseases classified elsewhere with behavioral disturbance: Secondary | ICD-10-CM

## 2019-03-12 LAB — BASIC METABOLIC PANEL
Anion gap: 16 — ABNORMAL HIGH (ref 5–15)
BUN: 23 mg/dL (ref 8–23)
CO2: 16 mmol/L — ABNORMAL LOW (ref 22–32)
Calcium: 9.2 mg/dL (ref 8.9–10.3)
Chloride: 109 mmol/L (ref 98–111)
Creatinine, Ser: 2.03 mg/dL — ABNORMAL HIGH (ref 0.44–1.00)
GFR calc Af Amer: 26 mL/min — ABNORMAL LOW (ref >60)
GFR calc non Af Amer: 23 mL/min — ABNORMAL LOW (ref >60)
Glucose, Bld: 111 mg/dL — ABNORMAL HIGH (ref 70–99)
Potassium: 3.6 mmol/L (ref 3.5–5.1)
Sodium: 141 mmol/L (ref 135–145)

## 2019-03-12 LAB — IMMUNOFIXATION ELECTROPHORESIS
IgA: 116 mg/dL (ref 64–422)
IgG (Immunoglobin G), Serum: 429 mg/dL — ABNORMAL LOW (ref 586–1602)
IgM (Immunoglobulin M), Srm: 27 mg/dL (ref 26–217)
Total Protein ELP: 5.7 g/dL — ABNORMAL LOW (ref 6.0–8.5)

## 2019-03-12 LAB — CBC
HCT: 27.1 % — ABNORMAL LOW (ref 36.0–46.0)
Hemoglobin: 8.9 g/dL — ABNORMAL LOW (ref 12.0–15.0)
MCH: 29.8 pg (ref 26.0–34.0)
MCHC: 32.8 g/dL (ref 30.0–36.0)
MCV: 90.6 fL (ref 80.0–100.0)
Platelets: 139 10*3/uL — ABNORMAL LOW (ref 150–400)
RBC: 2.99 MIL/uL — ABNORMAL LOW (ref 3.87–5.11)
RDW: 13.9 % (ref 11.5–15.5)
WBC: 7 10*3/uL (ref 4.0–10.5)
nRBC: 0 % (ref 0.0–0.2)

## 2019-03-12 LAB — URINE CULTURE: Culture: >=100000 — AB

## 2019-03-12 MED ORDER — AMLODIPINE BESYLATE 10 MG PO TABS
10.0000 mg | ORAL_TABLET | Freq: Every day | ORAL | Status: DC
  Administered 2019-03-12 – 2019-03-13 (×2): 10 mg via ORAL
  Filled 2019-03-12 (×2): qty 1

## 2019-03-12 MED ORDER — CARVEDILOL 6.25 MG PO TABS
6.2500 mg | ORAL_TABLET | Freq: Two times a day (BID) | ORAL | Status: DC
  Administered 2019-03-12 (×2): 6.25 mg via ORAL
  Filled 2019-03-12 (×2): qty 1

## 2019-03-12 MED ORDER — RAMELTEON 8 MG PO TABS
8.0000 mg | ORAL_TABLET | Freq: Every day | ORAL | Status: DC
  Administered 2019-03-12 – 2019-03-13 (×3): 8 mg via ORAL
  Filled 2019-03-12 (×5): qty 1

## 2019-03-12 MED ORDER — CARVEDILOL 6.25 MG PO TABS
6.2500 mg | ORAL_TABLET | Freq: Two times a day (BID) | ORAL | Status: DC
  Administered 2019-03-13 – 2019-03-14 (×3): 6.25 mg via ORAL
  Filled 2019-03-12 (×3): qty 1

## 2019-03-12 MED FILL — Lidocaine HCl Local Preservative Free (PF) Inj 1%: INTRAMUSCULAR | Qty: 60 | Status: AC

## 2019-03-12 NOTE — Progress Notes (Signed)
Subjective: Patient is at baseline orientation. She appears to be doing well. Patient's daughter is at bedside.She states that the patient was up frequently throughout the night using the bathroom and did not sleep very well.   Objective:  Vital signs in last 24 hours: Vitals:   03/11/19 1721 03/11/19 1900 03/11/19 1908 03/12/19 0503  BP: (!) 186/95 (!) 149/107 (!) 149/107 (!) 178/81  Pulse: 93 (!) 111 (!) 111 70  Resp:    16  Temp:   98.4 F (36.9 C) 98.1 F (36.7 C)  TempSrc:   Oral Oral  SpO2: 97% 98% 98% 95%  Weight:      Height:        Physical Exam: Physical Exam  Constitutional: She is oriented to person, place, and time and well-developed, well-nourished, and in no distress.  HENT:  Head: Normocephalic and atraumatic.  Eyes: EOM are normal.  Neck: Normal range of motion.  Cardiovascular: Normal rate, regular rhythm, normal heart sounds and intact distal pulses. Exam reveals no gallop and no friction rub.  No murmur heard. Pulmonary/Chest: Effort normal and breath sounds normal. No respiratory distress. She exhibits no tenderness.  Abdominal: Soft. She exhibits no distension. There is no abdominal tenderness.  Musculoskeletal: Normal range of motion.        General: No tenderness or edema.  Neurological: She is alert and oriented to person, place, and time.  Skin: Skin is warm and dry.       Pertinent labs/Imaging: CBC Latest Ref Rng & Units 03/12/2019 03/11/2019 03/09/2019  WBC 4.0 - 10.5 K/uL 7.0 6.2 6.6  Hemoglobin 12.0 - 15.0 g/dL 8.9(L) 9.0(L) 11.0(L)  Hematocrit 36.0 - 46.0 % 27.1(L) 27.1(L) 33.8(L)  Platelets 150 - 400 K/uL 139(L) 144(L) 178   CMP Latest Ref Rng & Units 03/12/2019 03/11/2019 03/10/2019  Glucose 70 - 99 mg/dL 111(H) 121(H) 96  BUN 8 - 23 mg/dL 23 34(H) 34(H)  Creatinine 0.44 - 1.00 mg/dL 2.03(H) 2.16(H) 2.33(H)  Sodium 135 - 145 mmol/L 141 142 142  Potassium 3.5 - 5.1 mmol/L 3.6 4.0 4.1  Chloride 98 - 111 mmol/L 109 110 111  CO2 22  - 32 mmol/L 16(L) 20(L) 20(L)  Calcium 8.9 - 10.3 mg/dL 9.2 8.9 9.1  Total Protein 6.5 - 8.1 g/dL - - -  Total Bilirubin 0.3 - 1.2 mg/dL - - -  Alkaline Phos 38 - 126 U/L - - -  AST 15 - 41 U/L - - -  ALT 0 - 44 U/L - - -      Assessment/Plan:  Principal Problem:   Syncope Active Problems:   Dementia (HCC)   Mitral valve mass   Second degree AV block, Mobitz type I   Second degree AV block, Mobitz type II   RBBB   Palliative care by specialist   DNR (do not resuscitate) discussion   Bradycardia    Patient Summary: Daisy Parker is a 78 year old female with a pertinent past medical history of CKD stage III, frontal temporal dementia, hypertension, hypothyroidism, HFpEF, cataracts, and previous hx of syncopal episode in 2017, who presented after a syncopal episode likely multifactorial in origin. Differential includes fronto-temporal dementia, symptomatic heart block, medication side effect, and/or some combination of the three. Patients hospital course has been complicated by an AKI with an acute increase in Cr from 1.79 (Feb) to 2.16.   #Syncope: - Cardiology consulted for EKG showing 2:1 AV block and Mass on Mitral valve. - S/p pacemaker placement  - Recent MRI  normal  - Palliative care has been consulted. They will likely need to follow the patient in the outpatient setting. - Appreciate cardiology and palliative recommendations.   #AV block - S/p St. Jude pacemaker placement 11/30 - Post-op CXR shows no signs of pneumo  #AKI on CKD III: - Cr improving with fluid, likely 2/2 to pre-renal. Cr at 2.2 down from 2.81 - Renal U/S negative for obstruction  - Continue LR 125cc/hr, consider decreasing today - Strict I&O  #E coli UTI: - Finished a 3 day course of Ceftriaxone.   #HTN: - Start Amlodipine 10 mg per cardiology  - Add Coreg 6.25 mg BID   Diet: Heart healthy   IVF: LR 125cc/hour VTE: Enoxaparin  Code: Full  Dispo: Anticipated discharge tomorrow pending  placement.   Daisy Payment, MD 03/12/2019, 5:51 AM Pager: [phone]

## 2019-03-12 NOTE — Progress Notes (Signed)
Patient ID: Daisy Parker, female   DOB: [birth year], 78 y.o.   MRN: [id number]  This NP visited patient at the bedside as a follow up for palliative medicine needs and emotional support.  Patient  is awake and alert and voices no complaints.  Daughter at bedside.   Patient is s/p pacemaker implantation with complication.   Daughter is comfortable with decision for pacemaker.  Discussed with daughter  the importance of continued conversation with the  medical providers regarding overall plan of care into the future   ensuring decisions are within the context of the patients values and GOCs.  Always considering current medical situation.   Recommend OP palliative services on discharge.  Questions and concerns addressed     Total time spent on the unit was 20 minutes  Greater than 50% of the time was spent in counseling and coordination of care  Wadie Lessen NP  Palliative Medicine Team Team Phone # [phone] Pager [phone]

## 2019-03-12 NOTE — Progress Notes (Signed)
Pt. Unable to sleep. Family at beside requesting pt. Get something for insomnia. On call for IMTS paged to make aware. Hilliard Borges, Katherine Roan

## 2019-03-12 NOTE — Progress Notes (Signed)
Pt. Has elevated BP. Ongoing issue, on call aware per previous RN.

## 2019-03-12 NOTE — Plan of Care (Signed)
  Problem: Clinical Measurements: Goal: Will remain free from infection Outcome: Progressing   Problem: Activity: Goal: Risk for activity intolerance will decrease Outcome: Progressing   

## 2019-03-12 NOTE — Plan of Care (Signed)
  Problem: Education: Goal: Knowledge of General Education information will improve Description Including pain rating scale, medication(s)/side effects and non-pharmacologic comfort measures Outcome: Progressing   

## 2019-03-12 NOTE — Progress Notes (Addendum)
Electrophysiology Rounding Note  Patient Name: Daisy Parker Date of Encounter: 03/12/2019  Electrophysiologist: Dr. Curt Bears   Subjective   The patient is doing well today.  At this time, the patient denies chest pain, shortness of breath, or any new concerns.  Inpatient Medications    Scheduled Meds: . amLODipine  10 mg Oral QHS  . carvedilol  6.25 mg Oral BID WC  . cholecalciferol  2,000 Units Oral Daily  . diclofenac sodium  2 g Topical TID  . enoxaparin (LOVENOX) injection  30 mg Subcutaneous Q24H  . fluticasone  1 spray Each Nare Daily  . hydroxypropyl methylcellulose / hypromellose  1 drop Both Eyes TID  . latanoprost  1 drop Both Eyes QHS  . levothyroxine  125 mcg Oral Q0600  . multivitamins with iron  1 tablet Oral Daily  . OLANZapine  7.5 mg Oral QHS  . polyethylene glycol  17 g Oral Daily  . ramelteon  8 mg Oral QHS   Continuous Infusions: . lactated ringers 125 mL/hr at 03/11/19 0526   PRN Meds: acetaminophen, ondansetron (ZOFRAN) IV   Vital Signs    Vitals:   03/11/19 1900 03/11/19 1908 03/12/19 0503 03/12/19 0545  BP: (!) 149/107 (!) 149/107 (!) 178/81   Pulse: (!) 111 (!) 111 70   Resp:   16   Temp:  98.4 F (36.9 C) 98.1 F (36.7 C)   TempSrc:  Oral Oral   SpO2: 98% 98% 95%   Weight:    76.9 kg  Height:        Intake/Output Summary (Last 24 hours) at 03/12/2019 0826 Last data filed at 03/11/2019 2300 Gross per 24 hour  Intake 3203.09 ml  Output 2150 ml  Net 1053.09 ml   Filed Weights   03/10/19 0422 03/11/19 0519 03/12/19 0545  Weight: 78.9 kg 77.6 kg 76.9 kg    Physical Exam    GEN- The patient is well appearing, alert and oriented x 3 today.   Head- normocephalic, atraumatic Eyes-  Sclera clear, conjunctiva pink Ears- hearing intact Oropharynx- clear Neck- supple Lungs- Clear to ausculation bilaterally, normal work of breathing Heart- Regular rate and rhythm, no murmurs, rubs or gallops GI- soft, NT, ND, + BS Extremities-  no clubbing, cyanosis, or edema Skin- no rash or lesion Psych- euthymic mood, full affect Neuro- strength and sensation are intact  Labs    CBC Recent Labs    03/11/19 0459 03/12/19 0522  WBC 6.2 7.0  HGB 9.0* 8.9*  HCT 27.1* 27.1*  MCV 91.6 90.6  PLT 144* [id number]*   Basic Metabolic Panel Recent Labs    03/11/19 0459 03/12/19 0522  NA 142 141  K 4.0 3.6  CL 110 109  CO2 20* 16*  GLUCOSE 121* 111*  BUN 34* 23  CREATININE 2.16* 2.03*  CALCIUM 8.9 9.2   Liver Function Tests No results for input(s): AST, ALT, ALKPHOS, BILITOT, PROT, ALBUMIN in the last 72 hours. No results for input(s): LIPASE, AMYLASE in the last 72 hours. Cardiac Enzymes No results for input(s): CKTOTAL, CKMB, CKMBINDEX, TROPONINI in the last 72 hours. BNP Invalid input(s): POCBNP D-Dimer No results for input(s): DDIMER in the last 72 hours. Hemoglobin A1C No results for input(s): HGBA1C in the last 72 hours. Fasting Lipid Panel No results for input(s): CHOL, HDL, LDLCALC, TRIG, CHOLHDL, LDLDIRECT in the last 72 hours. Thyroid Function Tests No results for input(s): TSH, T4TOTAL, T3FREE, THYROIDAB in the last 72 hours.  Invalid input(s): [employer]  Telemetry  V paced 70-80s (personally reviewed)  Radiology    Dg Chest 2 View  Result Date: 03/12/2019 CLINICAL DATA:  Status post defibrillator placement. EXAM: CHEST - 2 VIEW COMPARISON:  March 08, 2019. FINDINGS: Stable cardiomegaly. No pneumothorax or pleural effusion is noted. Lungs are clear. Bony thorax is unremarkable. Interval placement of left-sided pacemaker with leads in grossly good position. IMPRESSION: Interval placement of left-sided pacemaker with leads in grossly good position. No pneumothorax is noted. Electronically Signed   By: Marijo Conception M.D.   On: 03/12/2019 08:06     Patient Profile     Daisy Parker a 78 y.o.femalewith a hx of dementia and fallswho is being seen today for the evaluation of mobitz 2, second  degree AV blockat the request of Dr. Radford Pax.  Assessment & Plan    1. 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 Jude PPM 03/11/2019 Stable device function CXR this am without pneumothorax s/p PPM placement  2. Dementia Plans to return to memory care unit on discharge.  Wound care and arm restrictions are explicitly written out in patient instructions.   3. HTN Increase amlodipine to 10 mg daily Add coreg 6.25 mg BID.  Follow up in and instructions in chart. EP will see as needed while here.   For questions or updates, please contact [place] Please consult [email or web address] for contact info under Cardiology/STEMI.  Signed, Shirley Friar, PA-C  03/12/2019, 8:26 AM   I have seen and examined this patient with Oda Kilts.  Agree with above, note added to reflect my findings.  On exam, RRR, no murmurs, lungs clear.  Status post Saint Jude dual-chamber pacemaker implanted for second-degree AV block.  Chest x-ray and interrogation without issue.  At this point, no further EP work-up is needed.  We will arrange for follow-up in device clinic and EP clinic for device management.  Please call us back if further rhythm issues arise.  Will M. Camnitz MD 03/12/2019 9:34 AM

## 2019-03-13 DIAGNOSIS — R350 Frequency of micturition: Secondary | ICD-10-CM

## 2019-03-13 HISTORY — DX: Frequency of micturition: R35.0

## 2019-03-13 LAB — CULTURE, BLOOD (ROUTINE X 2)
Culture: NO GROWTH
Culture: NO GROWTH

## 2019-03-13 LAB — SARS CORONAVIRUS 2 (TAT 6-24 HRS): SARS Coronavirus 2: NEGATIVE

## 2019-03-13 MED ORDER — NITROFURANTOIN MACROCRYSTAL 100 MG PO CAPS: 100.0000 mg | ORAL_CAPSULE | Freq: Two times a day (BID) | ORAL | 0 refills | Status: AC

## 2019-03-13 MED ORDER — NITROFURANTOIN MACROCRYSTAL 100 MG PO CAPS: 100.0000 mg | ORAL_CAPSULE | Freq: Four times a day (QID) | ORAL | 0 refills | Status: DC

## 2019-03-13 MED ORDER — AMLODIPINE BESYLATE 10 MG PO TABS: 10.0000 mg | ORAL_TABLET | Freq: Every day | ORAL | 5 refills | Status: DC

## 2019-03-13 MED ORDER — CARVEDILOL 6.25 MG PO TABS: 6.2500 mg | ORAL_TABLET | Freq: Two times a day (BID) | ORAL | 5 refills | Status: DC

## 2019-03-13 MED ORDER — NITROFURANTOIN MONOHYD MACRO 100 MG PO CAPS
100.0000 mg | ORAL_CAPSULE | Freq: Once | ORAL | Status: AC
  Administered 2019-03-13: 100 mg via ORAL
  Filled 2019-03-13: qty 1

## 2019-03-13 NOTE — Progress Notes (Signed)
   Subjective: Patient was seen this morning. Denies new complains over night.  Objective:  Vital signs in last 24 hours: Vitals:   03/12/19 1054 03/12/19 1645 03/12/19 1948 03/13/19 0448  BP: (!) 181/95 (!) 184/85 (!) 159/72 140/71  Pulse: 60 63 69 69  Resp: 18  20 18   Temp: 98.3 F (36.8 C)  98.7 F (37.1 C) 98.9 F (37.2 C)  TempSrc: Oral  Oral Oral  SpO2: 99%  97% 96%  Weight:    75.3 kg  Height:        Physical Exam: Physical Exam  Constitutional: She is oriented to person, place, and time and well-developed, well-nourished, and in no distress.  HENT:  Head: Normocephalic and atraumatic.  Eyes: EOM are normal.  Neck: Normal range of motion.  Cardiovascular: Normal rate, regular rhythm, normal heart sounds and intact distal pulses. Exam reveals no gallop and no friction rub.  No murmur heard. Pulmonary/Chest: Effort normal and breath sounds normal. No respiratory distress. She exhibits no tenderness.  Abdominal: Soft. She exhibits no distension. There is no abdominal tenderness.  Musculoskeletal: Normal range of motion.        General: No tenderness or edema.  Neurological: She is alert and oriented to person, place, and time.  Skin: Skin is warm and dry.      Pertinent labs/Imaging: CBC Latest Ref Rng & Units 03/12/2019 03/11/2019 03/09/2019  WBC 4.0 - 10.5 K/uL 7.0 6.2 6.6  Hemoglobin 12.0 - 15.0 g/dL 8.9(L) 9.0(L) 11.0(L)  Hematocrit 36.0 - 46.0 % 27.1(L) 27.1(L) 33.8(L)  Platelets 150 - 400 K/uL 139(L) 144(L) 178   CMP Latest Ref Rng & Units 03/12/2019 03/11/2019 03/10/2019  Glucose 70 - 99 mg/dL 111(H) 121(H) 96  BUN 8 - 23 mg/dL 23 34(H) 34(H)  Creatinine 0.44 - 1.00 mg/dL 2.03(H) 2.16(H) 2.33(H)  Sodium 135 - 145 mmol/L 141 142 142  Potassium 3.5 - 5.1 mmol/L 3.6 4.0 4.1  Chloride 98 - 111 mmol/L 109 110 111  CO2 22 - 32 mmol/L 16(L) 20(L) 20(L)  Calcium 8.9 - 10.3 mg/dL 9.2 8.9 9.1  Total Protein 6.5 - 8.1 g/dL - - -  Total Bilirubin 0.3 - 1.2 mg/dL  - - -  Alkaline Phos 38 - 126 U/L - - -  AST 15 - 41 U/L - - -  ALT 0 - 44 U/L - - -     Assessment/Plan:  Principal Problem:   Syncope Active Problems:   Dementia (HCC)   Mitral valve mass   Second degree AV block, Mobitz type I   Second degree AV block, Mobitz type II   RBBB   Palliative care by specialist   DNR (do not resuscitate) discussion   Bradycardia    Patient Summary: Ms. Marcellus is a 78 year old female with a pertinent past medical history of CKD stage III, frontal temporal dementia, hypertension, hypothyroidism, HFpEF, cataracts, and previous hx of syncopal episode in 2017, who presented after a syncopal episode likely 2/2 to AV block now s/p pacemaker placement.  #Syncope - s/p pacemaker placement scheduled for follow up with Cardiology  #E coli UTI: - Sensitive to nitrofurantoin. Will send patient home with 5 day course.  #HTN: - continue carvedilol and amlodipine per cardiology   Diet: heart health IVF: none VTE: enoxaparin  Code: full  Dispo: Anticipated discharge today pending covid test.   Marianna Payment, MD 03/13/2019, 6:20 AM Pager: [phone]

## 2019-03-13 NOTE — TOC Progression Note (Signed)
Transition of Care [facility]) - Progression Note    Patient Details  Name: Daisy Parker MRN: [id number] Date of Birth: 11/13/1940  Transition of Care [facility]) CM/SW [place], [place] Phone Number: 03/13/2019, 12:20 PM  Clinical Narrative:    CSW has confirmed [place] arrangements at [place]; at this time new COVID ordered was placed as lab unable to process swab from 11/30. CSW has updated pt daughter and ALF with this information and faxed all needed paperwork to ALF Executive Director Daisy Parker.   Pt RN alerted that swab is needed. Await swab and result to arrange dc.    Expected Discharge Plan: Assisted Living Barriers to Discharge: Continued Medical Work up  Expected Discharge Plan and Services Expected Discharge Plan: Assisted Living In-house Referral: Clinical Social Work Post Acute Care Choice: [place] arrangements for the past 2 months: [place] Expected Discharge Date: 03/13/19    HH Arranged: PT, OT; RN [place] Agency: [facility] [employer]) Date [place]: 03/11/19 Time [place]: 1244 Representative spoke with at [place]: [place]   Readmission Risk Interventions No flowsheet data found.

## 2019-03-13 NOTE — Progress Notes (Signed)
  Date: 03/13/2019  Patient name: Daisy Parker  Medical record number: [id number]  Date of birth: 1940-07-10        I have seen and evaluated this patient and I have discussed the plan of care with the house staff. Please see their note for complete details. I concur with their findings with the following additions/corrections: [name] is seen this morning on team rounds.  Her daughter remains at bedside.  I agree with Dr. Marianna Payment, whose note will follow, but she is stable to be discharged to home.  She will have cardiac follow-up regarding her pacer.  There was suggestion of UTI symptoms on admission so I agree with Dr. Marianna Payment to treat with antibiotics.  Bartholomew Crews, MD 03/13/2019, 10:44 AM

## 2019-03-13 NOTE — Discharge Summary (Addendum)
Name: Daisy Parker MRN: [id number] DOB: [birth year] 78 y.o. PCP: Patient, No Pcp Per  Date of Admission: 03/08/2019  9:54 AM Date of Discharge:  Attending Physician: Bartholomew Crews, MD  Discharge Diagnosis: 1. Syncope secondary to AV block     Discharge Medications: Allergies as of 03/13/2019      Reactions   Codeine Anaphylaxis   Sulfa Antibiotics Anaphylaxis      Medication List    TAKE these medications   acetaminophen 500 MG tablet Commonly known as: TYLENOL Take 1,000 mg by mouth 3 (three) times daily. 0800, 1400, 2000.   allopurinol 100 MG tablet Commonly known as: ZYLOPRIM Take 100 mg by mouth daily.   amLODipine 10 MG tablet Commonly known as: NORVASC Take 1 tablet (10 mg total) by mouth at bedtime. What changed:   medication strength  how much to take  when to take this  additional instructions   carvedilol 6.25 MG tablet Commonly known as: COREG Take 1 tablet (6.25 mg total) by mouth 2 (two) times daily with a meal.   Daily Vite Multivitamin/Iron Tabs Take 1 tablet by mouth daily.   diclofenac sodium 1 % Gel Commonly known as: VOLTAREN Apply 2 g topically 3 (three) times daily. To knees, hips, and lower back.   fluticasone 50 MCG/ACT nasal spray Commonly known as: FLONASE Place 1 spray into both nostrils daily.   latanoprost 0.005 % ophthalmic solution Commonly known as: XALATAN Place 1 drop into both eyes at bedtime. Notes to patient: 03/13/2019 at bedtime   levothyroxine 150 MCG tablet Commonly known as: SYNTHROID Take 150 mcg by mouth daily before breakfast.   nitrofurantoin 100 MG capsule Commonly known as: MACRODANTIN Take 1 capsule (100 mg total) by mouth 2 (two) times daily for 5 days.   OLANZapine 7.5 MG tablet Commonly known as: ZYPREXA Take 7.5 mg by mouth at bedtime.   polyethylene glycol 17 g packet Commonly known as: MIRALAX / GLYCOLAX Take 17 g by mouth daily.   Vitamin D 50 MCG (2000 UT) tablet Take 2,000  Units by mouth daily.            Durable Medical Equipment  (From admission, onward)         Start     Ordered   03/13/19 0000  DME Bedside commode    Question:  Patient needs a bedside commode to treat with the following condition  Answer:  Urinary frequency   03/13/19 0925          Disposition and follow-up:   Daisy Parker was discharged from [facility] in Stable condition.  At the hospital follow up visit please address:  1.  Follow up:  A) HTN - new HTN medications started in the hospital. May need adjusting.  B) CKD - recent pre-renal azotemia and UTI, may need to check BMP to make sure Cr/GFR continue to trend toward baseline. C) UTI - make sure patient has complete 5 day course of nitrofurantoin  D) Follow-up appointments: make sure patient has made apportionments with cardiology and palliative care.  2.  Labs / imaging needed at time of follow-up: BMP  3.  Pending labs/ test needing follow-up: none  Follow-up Appointments: Follow-up Information    [place] [facility] Follow up on 03/21/2019.   Specialty: Cardiology Why: at 10 am for post pacemaker placement check Contact information: [address] [place] [phone]       [name],  MD Follow up on 06/13/2019.   Specialty: Cardiology Why: at 2 pm for 3 month post pacemaker check Contact information: [address] [place] [address] [phone]        Sueanne Margarita, MD Follow up in 1 week(s).   Specialty: Cardiology Contact information: [id number] [place] [address] [phone]           Hospital Course by problem list: Daisy Parker is a 78 year old female with a pertinent past medical history of CKD stage III, frontal temporal lobe dementia, hypertension, hypothyroidism, asthma HFpEF, cataracts, history of syncope who presented to [facility] ED after an episode of syncope  at her nursing facility.  1. Syncope - patient presented with signs and symptoms concerning for syncope.  She stated that she felt as though she was going to pass out.  She was unable to give additional information of the events leading up to the syncopal episode due to her dementia.  Patient was also admitted with signs and symptoms concerning for a UTI and dehydration likely secondary to poor p.o. intake.  She was treated with IV fluids and a 3-day course of ceftriaxone.  Patient was placed on telemetry which showed a persistent 2:1 AV block.  Does have a history of type I and type II AV block.  mL atropine was held at this time due to concern for worsening of AV block in the setting of a recent syncopal episode.  MRI of the head was performed to rule out any embolic phenomena or signs of stroke.  MRI was negative. Echocardiogram was performed which showed a large vegetation on her mitral valve.  Blood cultures were negative and vegetation on mitral valve was determined to be likely secondary to degenerative changes.  Physiology was consulted for AV block and patient is now status post pacemaker St. Jude's pacemaker. Cardiology and palliative care are set up to follow patient in the outpatient setting.  2.  E. coli UTI - patient was admitted with lower urinary tract symptoms including dysuria and polyuria with UA positive for urinary tract infection. Patient was treated with a 3 days course of Ceftriaxone. Cultures grew ceftriaxone resistant E. coli.  Patient will be discharged with 5-day course of nitrofurantoin 100 mg BID. Patient has a sulfa allergy, but no absolute contraindications to this antibiotic therapy. Patient will follow up with her PCP within a week to assess for resolution of her lower urinary tract symptoms.  3. AV block - has a history of type I and type II AV block.  Patient presented to [facility] with signs and symptoms concerning for syncope.  On telemetry the patient was noted to have  persistent 2:1 AV block.  Electrophysiology was consulted and St. Jude's pacemaker was placed on 03/11/2019.  Patient has follow-up scheduled with cardiology and electrophysiology.   3. HTN - patient has a history of hypertension treated with amlodipine 5 mg.  Upon admission to [facility] the patient's amlodipine was discontinued due to concerns of worsening AV block in the setting of syncopal episode.  Patient's status post St. Jude's pacemaker placement and restarted on amlodipine 10 mg daily and carvedilol 6.25 mg twice daily.  Patient should be scheduled for follow-up with her PCP to assess for further evaluation and management of hypertension and adjustment of her medications as needed.  Discharge Vitals:   BP (!) 178/80 (BP Location: Right Arm)    Pulse 60    Temp 99.1 F (37.3 C) (  Oral)    Resp 18    Ht 5\' 6"  (1.676 m)    Wt 75.3 kg    SpO2 99%    BMI 26.79 kg/m   Pertinent Labs, Studies, and Procedures:  CBC Latest Ref Rng & Units 03/12/2019 03/11/2019 03/09/2019  WBC 4.0 - 10.5 K/uL 7.0 6.2 6.6  Hemoglobin 12.0 - 15.0 g/dL 8.9(L) 9.0(L) 11.0(L)  Hematocrit 36.0 - 46.0 % 27.1(L) 27.1(L) 33.8(L)  Platelets 150 - 400 K/uL 139(L) 144(L) 178   CMP Latest Ref Rng & Units 03/12/2019 03/11/2019 03/10/2019  Glucose 70 - 99 mg/dL 111(H) 121(H) 96  BUN 8 - 23 mg/dL 23 34(H) 34(H)  Creatinine 0.44 - 1.00 mg/dL 2.03(H) 2.16(H) 2.33(H)  Sodium 135 - 145 mmol/L 141 142 142  Potassium 3.5 - 5.1 mmol/L 3.6 4.0 4.1  Chloride 98 - 111 mmol/L 109 110 111  CO2 22 - 32 mmol/L 16(L) 20(L) 20(L)  Calcium 8.9 - 10.3 mg/dL 9.2 8.9 9.1  Total Protein 6.5 - 8.1 g/dL - - -  Total Bilirubin 0.3 - 1.2 mg/dL - - -  Alkaline Phos 38 - 126 U/L - - -  AST 15 - 41 U/L - - -  ALT 0 - 44 U/L - - -   UA: [place], Many bacteria, > 50 leukocytes,  Urine Culture: E coli sensitive to nitrofurantoin    Discharge Instructions: Discharge Instructions    DME Bedside commode   Complete by: As directed    Patient needs a  bedside commode to treat with the following condition: Urinary frequency   Diet - low sodium heart healthy   Complete by: As directed    Diet - low sodium heart healthy   Complete by: As directed    Discharge instructions   Complete by: As directed    You were hospitalized for Syncope. Thank you for allowing [place] to be part of your care.   Please follow-up with: (contact information is listed on the after visit summary)  1) Hildred Alamin, MD - Primary Care Physician  2) Palliative Care 3) Cardiology    Please note these changes made to your medications:   Please START taking:  1) Amlodipine 10 mg daily 2) Carvedilol 6.25 mg twice daily  3) Otherwise, resume home medications listed on after visit summary   Please STOP taking:    Please make sure to follow up with the listed providers/specalties  Please call our clinic if you have any questions or concerns, we may be able to help and keep you from a long and expensive emergency room wait. Our clinic and after hours phone number is [phone], the best time to call is Monday through Friday 9 am to 4 pm but there is always someone available 24/7 if you have an emergency. If you need medication refills please notify your pharmacy one week in advance and they will send [place] a request.   Increase activity slowly   Complete by: As directed    Increase activity slowly   Complete by: As directed       Signed: Marianna Payment, MD 03/13/2019, 5:55 PM   Pager: [phone]

## 2019-03-13 NOTE — NC FL2 (Addendum)
[place] LEVEL OF CARE SCREENING TOOL     IDENTIFICATION  Patient Name: Daisy Parker Birthdate: 1941/02/01 Sex: female Admission Date (Current Location): 03/08/2019  [place] and [place] Number:  Kathleen Argue [id number] [place] and Address:  [facility], [place] [address], [place], [place] [address]      Provider Number: [id number]  Attending Physician Name and Address:  Bartholomew Crews, MD  Relative Name and Phone Number:  Spencer Schifano, Daughter, [phone]    Current Level of Care: Hospital Recommended Level of Care: [place] Prior Approval Number:    Date Approved/Denied:   PASRR Number:    Discharge Plan:  [facility]    Current Diagnoses: Patient Active Problem List   Diagnosis Date Noted  . Urinary frequency 03/13/2019  . Palliative care by specialist   . DNR (do not resuscitate) discussion   . Bradycardia   . Mitral valve mass   . Second degree AV block, Mobitz type I   . Second degree AV block, Mobitz type II   . RBBB   . Syncope 03/08/2019  . AKI (acute kidney injury) ([place]) 05/16/2018  . Encephalopathy acute 05/14/2018  . Dementia ([place]) 05/14/2018  . HTN (hypertension) 05/14/2018  . CKD (chronic kidney disease) 05/14/2018    Orientation RESPIRATION BLADDER Height & Weight     Self  Normal Continent Weight: 166 lb 0.1 oz (75.3 kg) Height:  5\' 6"  (167.6 cm)  BEHAVIORAL SYMPTOMS/MOOD NEUROLOGICAL BOWEL NUTRITION STATUS      Continent Diet(Regular diet, thin liquids, needs assistance)  AMBULATORY STATUS COMMUNICATION OF NEEDS Skin   Limited Assist Verbally Bruising(on hand/arm)                       Personal Care Assistance Level of Assistance  Bathing, Feeding, Dressing, Total care Bathing Assistance: Limited assistance Feeding assistance: Limited assistance Dressing Assistance: Limited assistance     Functional Limitations Info  Sight, Hearing, Speech Sight Info:  Adequate Hearing Info: Adequate Speech Info: Adequate    SPECIAL CARE FACTORS FREQUENCY  PT (By licensed PT), OT (By licensed OT)     PT Frequency: 2x-3x/wk OT Frequency: 2x-3x/wk            Contractures Contractures Info: Not present    Additional Factors Info  Code Status, Psychotropic, Allergies Code Status Info: Full Code Allergies Info: Codeine, Sulfa Antibiotics Psychotropic Info: zyprexa 7.5mg  at bedtime         Current Medications (03/13/2019):  This is the current hospital active medication list Current Facility-Administered Medications  Medication Dose Route Frequency Provider Last Rate Last Dose  . acetaminophen (TYLENOL) tablet 325-650 mg  325-650 mg Oral Q4H PRN [name], MD   650 mg at 03/11/19 2317  . amLODipine (NORVASC) tablet 10 mg  10 mg Oral QHS Shirley Friar, PA-C   10 mg at 03/12/19 2138  . carvedilol (COREG) tablet 6.25 mg  6.25 mg Oral BID WC Marianna Payment, MD   6.25 mg at 03/13/19 0906  . cholecalciferol (VITAMIN D3) tablet 2,000 Units  2,000 Units Oral Daily Willis, Nila Nephew D, DO   2,000 Units at 03/13/19 0910  . diclofenac sodium (VOLTAREN) 1 % transdermal gel 2 g  2 g Topical TID Bloomfield, Carley D, DO   2 g at 03/12/19 2138  . enoxaparin (LOVENOX) injection 30 mg  30 mg Subcutaneous Q24H Mosetta Anis, MD   30 mg at 03/12/19 1643  . fluticasone (  FLONASE) 50 MCG/ACT nasal spray 1 spray  1 spray Each Nare Daily Bloomfield, Carley D, DO   1 spray at 03/12/19 0835  . hydroxypropyl methylcellulose / hypromellose (ISOPTO TEARS / GONIOVISC) 2.5 % ophthalmic solution 1 drop  1 drop Both Eyes TID [place] Jude, [facility]   1 drop at 03/12/19 2143  . lactated ringers infusion   Intravenous Continuous Modena Nunnery D, DO 125 mL/hr at 03/13/19 [id number]    . latanoprost (XALATAN) 0.005 % ophthalmic solution 1 drop  1 drop Both Eyes QHS Bloomfield, Carley D, DO   1 drop at 03/12/19 2143  . levothyroxine (SYNTHROID) tablet 125 mcg  125 mcg  Oral Q0600 Modena Nunnery D, DO   125 mcg at 03/13/19 0602  . multivitamins with iron tablet 1 tablet  1 tablet Oral Daily Bloomfield, Carley D, DO   1 tablet at 03/13/19 0910  . OLANZapine (ZYPREXA) tablet 7.5 mg  7.5 mg Oral QHS Bloomfield, Carley D, DO   7.5 mg at 03/12/19 2143  . ondansetron (ZOFRAN) injection 4 mg  4 mg Intravenous Q6H PRN Camnitz, Will Hassell Done, MD      . polyethylene glycol (MIRALAX / GLYCOLAX) packet 17 g  17 g Oral Daily Bloomfield, Carley D, DO   17 g at 03/10/19 0931  . ramelteon (ROZEREM) tablet 8 mg  8 mg Oral Horton Finer, MD   8 mg at 03/12/19 2138     Discharge Medications: TAKE these medications       acetaminophen 500 MG tablet Commonly known as: TYLENOL Take 1,000 mg by mouth 3 (three) times daily. 0800, 1400, 2000.   allopurinol 100 MG tablet Commonly known as: ZYLOPRIM Take 100 mg by mouth daily.   amLODipine 10 MG tablet Commonly known as: NORVASC Take 1 tablet (10 mg total) by mouth at bedtime. What changed:   medication strength  how much to take  when to take this  additional instructions   carvedilol 6.25 MG tablet Commonly known as: COREG Take 1 tablet (6.25 mg total) by mouth 2 (two) times daily with a meal.   Daily Vite Multivitamin/Iron Tabs Take 1 tablet by mouth daily.   diclofenac sodium 1 % Gel Commonly known as: VOLTAREN Apply 2 g topically 3 (three) times daily. To knees, hips, and lower back.   fluticasone 50 MCG/ACT nasal spray Commonly known as: FLONASE Place 1 spray into both nostrils daily.   latanoprost 0.005 % ophthalmic solution Commonly known as: XALATAN Place 1 drop into both eyes at bedtime.   levothyroxine 150 MCG tablet Commonly known as: SYNTHROID Take 150 mcg by mouth daily before breakfast.   nitrofurantoin 100 MG capsule Commonly known as: MACRODANTIN Take 1 capsule (100 mg total) by mouth 4 (four) times daily for 5 days.   OLANZapine 7.5 MG tablet Commonly known as:  ZYPREXA Take 7.5 mg by mouth at bedtime.   polyethylene glycol 17 g packet Commonly known as: MIRALAX / GLYCOLAX Take 17 g by mouth daily.   Vitamin D 50 MCG (2000 UT) tablet Take 2,000 Units by mouth daily.      Relevant Imaging Results:  Relevant Lab Results:   Additional Information SS# Erin Glou[place], [place]

## 2019-03-13 NOTE — Progress Notes (Addendum)
During shift assessment, observed left upper chest pacemaker insertion site still covered with steristrips. Bruising/redness below pacemaker site, pt. Reported some tenderness to palpation, however, declined pain medication. Restricted extremity band applied to left arm.   Notified by infectious disease at approx 0854 patient needs to be on contact precautions for ESBL. Contact sign and gown initiated. Informed patient's daughter [name] at bedside reason for contact precautions and importance of wearing gown.    Spoke with Michiel Cowboy SW at 1104 about d/c transportation and informed awaiting COVID results before ok to d/c.   Spoke with daughter [name] at 1128 at bedside and informed need covid result prior to d/c and that patient had been previously swabbed, will notify [facility] of ESBL contact as part of RN report and informed will cluster care prior to discharge to minmimize agitation (give eye drops, take out IV, get dressed). Bed alarm set while daughter left bedside for cafeteria.   1211 received call from daughter, stated informed by MD need to swab patient and will come to bedside to assist given patient can become agitated with interaction. Will call daughter at [phone] once ready to swab patient.   COVID swab completed at 1440 and delivered to lab.   33 daughter left bedside, patient asleep. Bed alarm set.   During reassessment at 1820, patient reported feeling pain mid/left upper chest near pacemaker site with movement in bed while administering lovenx injection. Steri strips still in place with no drainage observed (old dried sangineous drainage under steristrips present during prior AM assessment). Increased bruising and redness around site extended down to left breast. Patient indicated pain  Dissipated. Area around pacemaker marked.   Patient declined dinner after offering assistance for toileting.   Paged Int Med at [phone]. Spoke with MD MacClean at [phone] and informed of  increased redness/bruising extending to left breast since AM, informed patient reported pain at site after administration of Lovenox. Informed patient declined pain med. Suggested MD assess site. Informed night RN of redness and assessed together during shift change.

## 2019-03-13 NOTE — Progress Notes (Signed)
Redness on skin above pacemaker site, noted by RN. Borders marked this evening. Per daughter, redness is mildly worse than yesterday. Patient denies chest pain, shortness of breath, itchiness.   On exam, skin findings as below located above pacemaker. Tender to palpation, no drainage. Findings consistent with ecchymosis. Will monitor for development.    Daisy Hubert, MD 03/13/2019, 8:35 PM

## 2019-03-13 NOTE — Care Management Important Message (Signed)
Important Message  Patient Details  Name: Daisy Parker MRN: [id number] Date of Birth: Jun 16, 1940   Medicare Important Message Given:  Yes-pt on precautions,gave the IM Letter to  NT Juanita.     Holli Humbles Smith 03/13/2019, 1:22 PM

## 2019-03-13 NOTE — Social Work (Signed)
Continue to await COVID screen.   Westley Hummer, MSW, [place] Work [phone]

## 2019-03-14 DIAGNOSIS — Z1619 Resistance to other specified beta lactam antibiotics: Secondary | ICD-10-CM

## 2019-03-14 DIAGNOSIS — Z881 Allergy status to other antibiotic agents status: Secondary | ICD-10-CM

## 2019-03-14 DIAGNOSIS — Z885 Allergy status to narcotic agent status: Secondary | ICD-10-CM

## 2019-03-14 NOTE — Progress Notes (Signed)
   Subjective:   Daisy Parker was examined and evaluated at bedside with daughter present. She was observed resting comfortably in bed. Spoke with daughter at length regarding plan to discharge today back to memory unit with oral antibiotics and new anti-hypertensive regimen.  Objective:  Vital signs in last 24 hours: Vitals:   03/13/19 1640 03/13/19 1757 03/13/19 2033 03/14/19 0609  BP: (!) 178/80  (!) 185/88 (!) 189/79  Pulse: 60 62 60 60  Resp: 18  16 16   Temp: 99.1 F (37.3 C)  98.7 F (37.1 C) 98.7 F (37.1 C)  TempSrc: Oral  Oral Oral  SpO2: 99%  96% 95%  Weight:      Height:       Physical Exam  Constitutional: She is well-developed, well-nourished, and in no distress.  HENT:  Head: Normocephalic and atraumatic.  Eyes: EOM are normal.  Neck: Normal range of motion.  Cardiovascular: Normal rate, regular rhythm, normal heart sounds and intact distal pulses. Exam reveals no gallop and no friction rub.  No murmur heard. Pulmonary/Chest: Effort normal and breath sounds normal. No respiratory distress. She exhibits no tenderness.  Abdominal: Soft. She exhibits no distension. There is no abdominal tenderness.  Musculoskeletal: Normal range of motion.  Neurological: She is alert.  Skin: Skin is warm and dry.     Assessment/Plan:  Principal Problem:   Syncope Active Problems:   Dementia (HCC)   Mitral valve mass   Second degree AV block, Mobitz type I   Second degree AV block, Mobitz type II   RBBB   Palliative care by specialist   DNR (do not resuscitate) discussion   Bradycardia   Urinary frequency   Patient summary: Daisy Parker is a 39-year-old female with a pertinent past medical history of CKD stage III, frontal temporal dementia, hypertension, hypothyroidism, HFpEF, cataracts, who present to [facility] with a syncopal episode 2/2 to AV block.   # Syncope: - s/p pacemaker placement and doing well. Mild pain around surgery site with some ecchymosis  #E coli UTI: -  Discharge on 5 day course of Nitrofurantoin    #HTN: - continue Carvedilol and amlodipine per cardiology   Dispo: Anticipated discharge today.   Mosetta Anis, MD 03/14/2019, 8:15 AM Pager: @[email or web address] @

## 2019-03-14 NOTE — TOC Transition Note (Addendum)
Transition of Care [facility]) - CM/SW Discharge Note   Patient Details  Name: Daisy Parker MRN: [id number] Date of Birth: 1940/12/31  Transition of Care [facility]) CM/SW Contact:  Alberteen Sam, LCSW Phone Number: 03/14/2019, 11:19 AM   Clinical Narrative:     Patient will DC to: [place] Anticipated DC date: 03/14/2019 Family notified: [name] Transport [id number]   Per MD patient ready for DC to [facility] . RN, patient, patient's family, and facility notified of DC. Discharge Summary sent to facility. RN given number for report   [phone]. DC packet on chart. Ambulance transport  Will be requested for patient by RN per her request, PTAR's number is [phone].  CSW signing off.  [place], [place]   Final next level of care: Assisted Living Barriers to Discharge: No Barriers Identified   Patient Goals and CMS Choice Patient states their goals for this hospitalization and ongoing recovery are:: Pt wants to go back to her assisted living facility CMS Medicare.gov Compare Post Acute Care list provided to:: Patient Represenative (must comment)(daughter [name]) Choice offered to / list presented to : Patient, Adult Children  Discharge Placement              Patient chooses bed at: [facility] Patient to be transferred to facility by: [place] Name of family member notified: [name] Patient and family notified of of transfer: 03/14/19  Discharge Plan and Services In-house Referral: Clinical Social Work   Post Acute Care Choice: Home Health                    HH Arranged: Nurse's Aide, RN, OT, PT [place] Agency: [facility] [employer] Date [place]: 03/13/19 Time [place]: 1244 Representative spoke with at [place]: [place] ([place]) Interventions     Readmission Risk Interventions No flowsheet data found.

## 2019-03-14 NOTE — Progress Notes (Signed)
During shift assessment pacemaker site bruising and redness consistent with prior markings. MD Marianna Payment at bedside during med administration and MD confirmed pacemaker site appearance appropriate.   Called PTAR at 1214. PTAR arrived at 1305, provided AVS and provided pacemaker monitor and patient belonging in belonging bags.  Called report to [facility] at 1312, gave report to [facility] memory care Freight forwarder at 1332. Informed contact precautions, patient left unit at 1332.

## 2019-03-14 NOTE — TOC Progression Note (Signed)
Transition of Care [facility]) - Progression Note    Patient Details  Name: Daisy Parker MRN: [id number] Date of Birth: 11/19/1940  Transition of Care [facility]) CM/SW [place], [place] Phone Number: [phone] 03/14/2019, 9:29 AM  Clinical Narrative:     CSW has faxed COVID negative test, fl2 and dc summary to [place] at [place] at [phone]. She reports care manager will review and call CSW when they are ready to accept patient today for PTAR to be called.   Expected Discharge Plan: Assisted Living Barriers to Discharge: Continued Medical Work up  Expected Discharge Plan and Services Expected Discharge Plan: Assisted Living In-house Referral: Clinical Social Work   Post Acute Care Choice: [place] arrangements for the past 2 months: [place] Expected Discharge Date: 03/13/19                         HH Arranged: PT, OT HH Agency: [facility] [employer] Date [place]: 03/11/19 Time [place]: 1244 Representative spoke with at [place]: [place] ([place]) Interventions    Readmission Risk Interventions No flowsheet data found.

## 2019-03-14 NOTE — Progress Notes (Signed)
Daisy Parker to be D/C'd Skilled nursing facility per MD order.  Discussed with the patient and all questions fully answered.  VSS, Skin clean, dry and intact without evidence of skin break down, no evidence of skin tears noted.  IV catheter discontinued intact. Site without signs and symptoms of complications. Dressing and pressure applied.  An After Visit Summary was printed and given to th PTAR. Report called to Tammy Secretary/administrator at [phone]) at [facility].   D/c education completed with patient/family including follow up instructions, medication list, d/c activities limitations if indicated, with other d/c instructions as indicated by MD - patient able to verbalize understanding, all questions fully answered.   Patient instructed to return to ED, call 911, or call MD for any changes in condition.   Patient escorted via stretcher with PTAR to [facility] center. Patient left unit at 1332  Howard Pouch 03/14/2019 1:39 PM

## 2019-03-14 NOTE — Progress Notes (Signed)
  Date: 03/14/2019  Patient name: Daisy Parker  Medical record number: [id number]  Date of birth: Oct 26, 1940        I have seen and evaluated this patient and I have discussed the plan of care with the house staff. Please see their note for complete details. I concur with their findings with the following additions/corrections: [name] was seen this morning on team rounds.  Her discharge yesterday was delayed as her Covid test had been misplaced.  She is Covid negative and is being discharged to her memory facility today.  Bartholomew Crews, MD 03/14/2019, 12:48 PM

## 2019-03-15 ENCOUNTER — Telehealth: Payer: Self-pay | Admitting: Cardiology

## 2019-03-15 NOTE — Telephone Encounter (Signed)
Patient has wound check appt on 03/21/19 at 10:00. Patient's daughter would like someone to go over what that appointment consists of.

## 2019-03-15 NOTE — Telephone Encounter (Signed)
LMOM. Contacted to go over wound check appointment details.

## 2019-03-20 ENCOUNTER — Telehealth: Payer: Self-pay

## 2019-03-20 NOTE — Telephone Encounter (Signed)
New message   Daughter [name] calling, states she wants to accompany her mother during 12/10 appointment. However the rehab center wants a staff member from the memory care unit to be present as well to hear information given.   Please call [name] [phone]

## 2019-03-20 NOTE — Telephone Encounter (Signed)
LMOVM requesting call back to DC. Direct number and office hours given. Will advise of visitor restrictions--will offer to have either [name] or facility staff member on speaker phone during visit.

## 2019-03-20 NOTE — Telephone Encounter (Signed)
See phone note from 03/20/19.

## 2019-03-21 ENCOUNTER — Other Ambulatory Visit: Payer: Self-pay

## 2019-03-21 ENCOUNTER — Ambulatory Visit (INDEPENDENT_AMBULATORY_CARE_PROVIDER_SITE_OTHER): Payer: Medicare Other | Admitting: *Deleted

## 2019-03-21 DIAGNOSIS — I441 Atrioventricular block, second degree: Secondary | ICD-10-CM

## 2019-03-21 LAB — CUP PACEART INCLINIC DEVICE CHECK
Battery Remaining Longevity: 86 mo
Battery Voltage: 3.04 V
Brady Statistic RA Percent Paced: 64 %
Brady Statistic RV Percent Paced: 99.96 %
Date Time Interrogation Session: 20201210163721
Implantable Lead Implant Date: 20201130
Implantable Lead Implant Date: 20201130
Implantable Lead Location: 753859
Implantable Lead Location: 753860
Implantable Pulse Generator Implant Date: 20201130
Lead Channel Impedance Value: 387.5 Ohm
Lead Channel Impedance Value: 562.5 Ohm
Lead Channel Pacing Threshold Amplitude: 0.5 V
Lead Channel Pacing Threshold Amplitude: 0.5 V
Lead Channel Pacing Threshold Pulse Width: 0.5 ms
Lead Channel Pacing Threshold Pulse Width: 0.5 ms
Lead Channel Sensing Intrinsic Amplitude: 4.3 mV
Lead Channel Sensing Intrinsic Amplitude: 4.6 mV
Lead Channel Setting Pacing Amplitude: 0.75 V
Lead Channel Setting Pacing Amplitude: 3.5 V
Lead Channel Setting Pacing Pulse Width: 0.5 ms
Lead Channel Setting Sensing Sensitivity: 2 mV
Pulse Gen Model: 2272
Pulse Gen Serial Number: 9182735

## 2019-03-21 NOTE — Telephone Encounter (Signed)
I spoke with the pt daughter to let her know we can only have one person with the pt at her appointment time. I told her the room is small and we still have the visitor restrictions. I told her if she wants the aide in the room we can call her on speaker phone or if she wants to be in the room we can call the aide. The daughter verbalized understanding and thanked me for explaining the visitor restrictions to her.

## 2019-03-21 NOTE — Progress Notes (Signed)
Wound check appointment. Steri-strips removed. Wound without redness or edema. Incision edges approximated, wound well healed. Normal device function. Thresholds, sensing, and impedances consistent with implant measurements. Device programmed at 3.5V/auto capture programmed on for extra safety margin until 3 month visit. Histogram distribution appropriate for patient and level of activity. No mode switches or high ventricular rates noted. Patient educated about wound care, arm mobility, lifting restrictions, patient unable to comprehend due to dementia. Care giver with patient received education. Remote transmission scheduled for 06/11/19.ROV on 06/13/19

## 2019-03-21 NOTE — Patient Instructions (Signed)
Call office if patient develops redness, drainage or increased swelling at incision site.

## 2019-05-15 ENCOUNTER — Telehealth: Payer: Self-pay

## 2019-05-15 ENCOUNTER — Inpatient Hospital Stay (HOSPITAL_COMMUNITY)
Admission: EM | Admit: 2019-05-15 | Discharge: 2019-05-21 | DRG: 177 | Disposition: A | Discharge status: Home / Self Care | Payer: Medicare Other | Source: Skilled Nursing Facility | Attending: Internal Medicine | Admitting: Internal Medicine

## 2019-05-15 ENCOUNTER — Encounter (HOSPITAL_COMMUNITY): Payer: Self-pay | Admitting: Emergency Medicine

## 2019-05-15 ENCOUNTER — Emergency Department (HOSPITAL_COMMUNITY): Payer: Medicare Other

## 2019-05-15 ENCOUNTER — Other Ambulatory Visit: Payer: Self-pay

## 2019-05-15 DIAGNOSIS — Z79890 Hormone replacement therapy (postmenopausal): Secondary | ICD-10-CM

## 2019-05-15 DIAGNOSIS — E860 Dehydration: Secondary | ICD-10-CM | POA: Diagnosis present

## 2019-05-15 DIAGNOSIS — E039 Hypothyroidism, unspecified: Secondary | ICD-10-CM | POA: Diagnosis present

## 2019-05-15 DIAGNOSIS — R0603 Acute respiratory distress: Secondary | ICD-10-CM | POA: Diagnosis present

## 2019-05-15 DIAGNOSIS — F0280 Dementia in other diseases classified elsewhere without behavioral disturbance: Secondary | ICD-10-CM | POA: Diagnosis present

## 2019-05-15 DIAGNOSIS — E1122 Type 2 diabetes mellitus with diabetic chronic kidney disease: Secondary | ICD-10-CM | POA: Diagnosis present

## 2019-05-15 DIAGNOSIS — Z882 Allergy status to sulfonamides status: Secondary | ICD-10-CM

## 2019-05-15 DIAGNOSIS — I441 Atrioventricular block, second degree: Secondary | ICD-10-CM | POA: Diagnosis present

## 2019-05-15 DIAGNOSIS — D631 Anemia in chronic kidney disease: Secondary | ICD-10-CM | POA: Diagnosis present

## 2019-05-15 DIAGNOSIS — R001 Bradycardia, unspecified: Secondary | ICD-10-CM | POA: Diagnosis present

## 2019-05-15 DIAGNOSIS — Z950 Presence of cardiac pacemaker: Secondary | ICD-10-CM

## 2019-05-15 DIAGNOSIS — Z79899 Other long term (current) drug therapy: Secondary | ICD-10-CM

## 2019-05-15 DIAGNOSIS — N179 Acute kidney failure, unspecified: Secondary | ICD-10-CM | POA: Diagnosis present

## 2019-05-15 DIAGNOSIS — G3109 Other frontotemporal dementia: Secondary | ICD-10-CM | POA: Diagnosis present

## 2019-05-15 DIAGNOSIS — J1282 Pneumonia due to coronavirus disease 2019: Secondary | ICD-10-CM | POA: Diagnosis present

## 2019-05-15 DIAGNOSIS — I10 Essential (primary) hypertension: Secondary | ICD-10-CM | POA: Diagnosis present

## 2019-05-15 DIAGNOSIS — N1832 Chronic kidney disease, stage 3b: Secondary | ICD-10-CM | POA: Diagnosis present

## 2019-05-15 DIAGNOSIS — R0902 Hypoxemia: Secondary | ICD-10-CM | POA: Diagnosis present

## 2019-05-15 DIAGNOSIS — E872 Acidosis: Secondary | ICD-10-CM | POA: Diagnosis present

## 2019-05-15 DIAGNOSIS — N390 Urinary tract infection, site not specified: Secondary | ICD-10-CM

## 2019-05-15 DIAGNOSIS — U071 COVID-19: Principal | ICD-10-CM | POA: Diagnosis present

## 2019-05-15 DIAGNOSIS — Z885 Allergy status to narcotic agent status: Secondary | ICD-10-CM | POA: Diagnosis not present

## 2019-05-15 DIAGNOSIS — F0390 Unspecified dementia without behavioral disturbance: Secondary | ICD-10-CM | POA: Diagnosis present

## 2019-05-15 DIAGNOSIS — E869 Volume depletion, unspecified: Secondary | ICD-10-CM | POA: Diagnosis present

## 2019-05-15 DIAGNOSIS — Z87891 Personal history of nicotine dependence: Secondary | ICD-10-CM

## 2019-05-15 DIAGNOSIS — I129 Hypertensive chronic kidney disease with stage 1 through stage 4 chronic kidney disease, or unspecified chronic kidney disease: Secondary | ICD-10-CM | POA: Diagnosis present

## 2019-05-15 HISTORY — DX: COVID-19: U07.1

## 2019-05-15 HISTORY — DX: Pneumonia due to coronavirus disease 2019: J12.82

## 2019-05-15 LAB — CBC WITH DIFFERENTIAL/PLATELET
Abs Immature Granulocytes: 0.14 10*3/uL — ABNORMAL HIGH (ref 0.00–0.07)
Basophils Absolute: 0 10*3/uL (ref 0.0–0.1)
Basophils Relative: 0 %
Eosinophils Absolute: 0.1 10*3/uL (ref 0.0–0.5)
Eosinophils Relative: 1 %
HCT: 31.3 % — ABNORMAL LOW (ref 36.0–46.0)
Hemoglobin: 10 g/dL — ABNORMAL LOW (ref 12.0–15.0)
Immature Granulocytes: 1 %
Lymphocytes Relative: 9 %
Lymphs Abs: 0.9 10*3/uL (ref 0.7–4.0)
MCH: 29.7 pg (ref 26.0–34.0)
MCHC: 31.9 g/dL (ref 30.0–36.0)
MCV: 92.9 fL (ref 80.0–100.0)
Monocytes Absolute: 0.8 10*3/uL (ref 0.1–1.0)
Monocytes Relative: 8 %
Neutro Abs: 8 10*3/uL — ABNORMAL HIGH (ref 1.7–7.7)
Neutrophils Relative %: 81 %
Platelets: 190 10*3/uL (ref 150–400)
RBC: 3.37 MIL/uL — ABNORMAL LOW (ref 3.87–5.11)
RDW: 14.4 % (ref 11.5–15.5)
WBC: 10 10*3/uL (ref 4.0–10.5)
nRBC: 0 % (ref 0.0–0.2)

## 2019-05-15 LAB — C-REACTIVE PROTEIN: CRP: 0.7 mg/dL (ref <1.0)

## 2019-05-15 LAB — TRIGLYCERIDES: Triglycerides: 180 mg/dL — ABNORMAL HIGH (ref <150)

## 2019-05-15 LAB — D-DIMER, QUANTITATIVE: D-Dimer, Quant: 1.89 ug/mL-FEU — ABNORMAL HIGH (ref 0.00–0.50)

## 2019-05-15 LAB — COMPREHENSIVE METABOLIC PANEL
ALT: 19 U/L (ref 0–44)
AST: 19 U/L (ref 15–41)
Albumin: 3.4 g/dL — ABNORMAL LOW (ref 3.5–5.0)
Alkaline Phosphatase: 60 U/L (ref 38–126)
Anion gap: 11 (ref 5–15)
BUN: 61 mg/dL — ABNORMAL HIGH (ref 8–23)
CO2: 18 mmol/L — ABNORMAL LOW (ref 22–32)
Calcium: 9.5 mg/dL (ref 8.9–10.3)
Chloride: 110 mmol/L (ref 98–111)
Creatinine, Ser: 2.89 mg/dL — ABNORMAL HIGH (ref 0.44–1.00)
GFR calc Af Amer: 17 mL/min — ABNORMAL LOW (ref >60)
GFR calc non Af Amer: 15 mL/min — ABNORMAL LOW (ref >60)
Glucose, Bld: 131 mg/dL — ABNORMAL HIGH (ref 70–99)
Potassium: 4.2 mmol/L (ref 3.5–5.1)
Sodium: 139 mmol/L (ref 135–145)
Total Bilirubin: 0.4 mg/dL (ref 0.3–1.2)
Total Protein: 6.5 g/dL (ref 6.5–8.1)

## 2019-05-15 LAB — LACTIC ACID, PLASMA
Lactic Acid, Venous: 1.4 mmol/L (ref 0.5–1.9)
Lactic Acid, Venous: 0.9 mmol/L (ref 0.5–1.9)

## 2019-05-15 LAB — PROCALCITONIN: Procalcitonin: <0.1 ng/mL

## 2019-05-15 LAB — FERRITIN: Ferritin: 255 ng/mL (ref 11–307)

## 2019-05-15 LAB — ABO/RH: ABO/RH(D): B POS

## 2019-05-15 LAB — VALPROIC ACID LEVEL: Valproic Acid Lvl: <10 ug/mL — ABNORMAL LOW (ref 50.0–100.0)

## 2019-05-15 LAB — LACTATE DEHYDROGENASE: LDH: 113 U/L (ref 98–192)

## 2019-05-15 LAB — FIBRINOGEN: Fibrinogen: 643 mg/dL — ABNORMAL HIGH (ref 210–475)

## 2019-05-15 MED ORDER — HEPARIN SODIUM (PORCINE) 5000 UNIT/ML IJ SOLN
5000.0000 [IU] | Freq: Three times a day (TID) | INTRAMUSCULAR | Status: DC
  Administered 2019-05-15 – 2019-05-21 (×17): 5000 [IU] via SUBCUTANEOUS
  Filled 2019-05-15 (×18): qty 1

## 2019-05-15 MED ORDER — SODIUM CHLORIDE 0.9 % IV SOLN
200.0000 mg | Freq: Once | INTRAVENOUS | Status: AC
  Administered 2019-05-15: 14:00:00 200 mg via INTRAVENOUS
  Filled 2019-05-15: qty 200

## 2019-05-15 MED ORDER — SODIUM CHLORIDE 0.9 % IV SOLN
100.0000 mg | Freq: Every day | INTRAVENOUS | Status: AC
  Administered 2019-05-16 – 2019-05-19 (×4): 100 mg via INTRAVENOUS
  Filled 2019-05-15 (×2): qty 20
  Filled 2019-05-15: qty 100
  Filled 2019-05-15: qty 20

## 2019-05-15 MED ORDER — ASCORBIC ACID 500 MG PO TABS
500.0000 mg | ORAL_TABLET | Freq: Every day | ORAL | Status: DC
  Administered 2019-05-15 – 2019-05-21 (×7): 500 mg via ORAL
  Filled 2019-05-15 (×7): qty 1

## 2019-05-15 MED ORDER — BISACODYL 5 MG PO TBEC: 5.0000 mg | DELAYED_RELEASE_TABLET | Freq: Every day | ORAL | Status: DC | PRN

## 2019-05-15 MED ORDER — ZINC SULFATE 220 (50 ZN) MG PO CAPS
220.0000 mg | ORAL_CAPSULE | Freq: Every day | ORAL | Status: DC
  Administered 2019-05-15 – 2019-05-21 (×7): 220 mg via ORAL
  Filled 2019-05-15 (×7): qty 1

## 2019-05-15 MED ORDER — ONDANSETRON HCL 4 MG/2ML IJ SOLN: 4.0000 mg | Freq: Four times a day (QID) | INTRAMUSCULAR | Status: DC | PRN

## 2019-05-15 MED ORDER — OLANZAPINE 7.5 MG PO TABS
7.5000 mg | ORAL_TABLET | Freq: Every day | ORAL | Status: DC
  Administered 2019-05-15 – 2019-05-20 (×6): 7.5 mg via ORAL
  Filled 2019-05-15 (×8): qty 1

## 2019-05-15 MED ORDER — DEXAMETHASONE SODIUM PHOSPHATE 10 MG/ML IJ SOLN
6.0000 mg | Freq: Every day | INTRAMUSCULAR | Status: DC
  Administered 2019-05-15 – 2019-05-21 (×7): 6 mg via INTRAVENOUS
  Filled 2019-05-15 (×7): qty 1

## 2019-05-15 MED ORDER — OLANZAPINE 7.5 MG PO TABS: 7.5000 mg | ORAL_TABLET | Freq: Every day | ORAL | Status: DC

## 2019-05-15 MED ORDER — NYSTATIN 100000 UNIT/GM EX CREA
TOPICAL_CREAM | Freq: Two times a day (BID) | CUTANEOUS | Status: DC
  Filled 2019-05-15: qty 15

## 2019-05-15 MED ORDER — ACETAMINOPHEN 325 MG PO TABS
650.0000 mg | ORAL_TABLET | Freq: Four times a day (QID) | ORAL | Status: DC | PRN
  Administered 2019-05-15 – 2019-05-18 (×2): 650 mg via ORAL
  Filled 2019-05-15 (×2): qty 2

## 2019-05-15 MED ORDER — MAGNESIUM CITRATE PO SOLN
1.0000 | Freq: Once | ORAL | Status: DC | PRN
  Filled 2019-05-15: qty 296

## 2019-05-15 MED ORDER — GUAIFENESIN-DM 100-10 MG/5ML PO SYRP: 10.0000 mL | ORAL_SOLUTION | ORAL | Status: DC | PRN

## 2019-05-15 MED ORDER — ONDANSETRON HCL 4 MG PO TABS: 4.0000 mg | ORAL_TABLET | Freq: Four times a day (QID) | ORAL | Status: DC | PRN

## 2019-05-15 MED ORDER — SENNOSIDES-DOCUSATE SODIUM 8.6-50 MG PO TABS: 1.0000 | ORAL_TABLET | Freq: Every evening | ORAL | Status: DC | PRN

## 2019-05-15 MED ORDER — IPRATROPIUM-ALBUTEROL 20-100 MCG/ACT IN AERS
1.0000 | INHALATION_SPRAY | Freq: Four times a day (QID) | RESPIRATORY_TRACT | Status: DC
  Administered 2019-05-15 – 2019-05-21 (×19): 1 via RESPIRATORY_TRACT
  Filled 2019-05-15 (×2): qty 4

## 2019-05-15 NOTE — H&P (Addendum)
History and Physical    Daisy Parker [id number] DOB: 23-Nov-1940 DOA: 05/15/2019  PCP: Patient, No Pcp Per  Patient coming from: [place] have personally briefly reviewed patient's old medical records in [place]  Chief Complaint: generalized weakness  HPI: Daisy Parker is a 79 y.o. female with medical history significant for frontotemporal dementia hypertension, hypothyroidism, CKD stage III, bradycardia and anemia. She presented from [place] to the ED today with profound generalized weakness and found to be hypoxic with O2 sat in the 80s at SNF and with EMS. She had tested positive for COVID-19 at the facility on 1/20. Not hypoxic on arrival to the ED. Patient denies shortness of breath, cough or chest pain. Further history from patient is questionable as she became irritated, which daughter reports is normal for her. Reports episodes of significant agitation with prior hospital admissions, specifically at [facility] in November, requiring restraints and medications. Daughter also reports urinary incontinence and 24 pounds weight loss within the past year.    ED Course: Vitals stable. CO2 18, glucose 131, BUN 61, creatinine 2.89, no leukocytosis, hemoglobin 10, D-dimer 1.89, fibrinogen 643. CRP pending. Patient admitted as inpatient and transferred to [facility] for further management.     Review of Systems: As per HPI otherwise 10 point review of systems negative.    Past Medical History:  Diagnosis Date  . Anemia   . Bradycardia   . Cataract   . CKD (chronic kidney disease), stage IV ([place])   . Dementia ([place])   . Duodenal perforation ([place])   . Hypertension   . Hypothyroidism   . Syncope     Past Surgical History:  Procedure Laterality Date  . PACEMAKER IMPLANT N/A 03/11/2019   Procedure: PACEMAKER IMPLANT;  Surgeon: [name], MD;  Location: [place] [facility];  Service: Cardiovascular;  Laterality: N/A;     reports that she has quit  smoking. She uses smokeless tobacco. She reports that she does not drink alcohol or use drugs.  Allergies  Allergen Reactions  . Codeine Anaphylaxis  . Sulfa Antibiotics Anaphylaxis    Family History  Family history unknown: Yes    Prior to Admission medications   Medication Sig Start Date End Date Taking? Authorizing Provider  acetaminophen (TYLENOL) 500 MG tablet Take 1,000 mg by mouth 3 (three) times daily. 0800, 1400, 2000.    [provider]  allopurinol (ZYLOPRIM) 100 MG tablet Take 100 mg by mouth daily.    [provider]  amLODipine (NORVASC) 10 MG tablet Take 1 tablet (10 mg total) by mouth at bedtime. 03/13/19   Marianna Payment, MD  carvedilol (COREG) 6.25 MG tablet Take 1 tablet (6.25 mg total) by mouth 2 (two) times daily with a meal. 03/13/19   Marianna Payment, MD  Cholecalciferol (VITAMIN D) 50 MCG (2000 UT) tablet Take 2,000 Units by mouth daily.    [provider]  diclofenac sodium (VOLTAREN) 1 % GEL Apply 2 g topically 3 (three) times daily. To knees, hips, and lower back.    [provider]  fluticasone (FLONASE) 50 MCG/ACT nasal spray Place 1 spray into both nostrils daily.    [provider]  latanoprost (XALATAN) 0.005 % ophthalmic solution Place 1 drop into both eyes at bedtime.    [provider]  levothyroxine (SYNTHROID) 150 MCG tablet Take 150 mcg by mouth daily before breakfast.    [provider]  Multiple Vitamins-Iron (DAILY VITE MULTIVITAMIN/IRON) TABS Take 1 tablet by mouth  daily.    [provider]  OLANZapine (ZYPREXA) 7.5 MG tablet Take 7.5 mg by mouth at bedtime.    [provider]  polyethylene glycol (MIRALAX / GLYCOLAX) packet Take 17 g by mouth daily. 05/17/18   Dorrell, Andree Elk, MD    Physical Exam: Vitals:   05/15/19 1040 05/15/19 1045 05/15/19 1142 05/15/19 1200  BP: 129/72 117/71 126/70 111/68  Pulse: (!) 59 (!) 59 60 (!) 59  Resp: 12 12 13 11   Temp:        TempSrc:      SpO2: 100% 100% 99% 99%  Weight:      Height:       Physical exam limited by patient cooperation and agitation.  Constitutional: NAD, calm, comfortable Eyes: EOMI, lids and conjunctivae normal ENMT: dry mucus membranes, no appreciable lymphadenopathy.  Respiratory: clear anteriorfly, diminished lateral bases, patient would not site up for posterior exam, no wheezing or rhonchi. Normal respiratory effort. No accessory muscle use. On room air. Cardiovascular: RRR, normal S1/S2, nonpitting lower extremity edema. 2+ pedal pulses.  Abdomen: soft, NT on light palpation, further exam patient refused Musculoskeletal: no clubbing / cyanosis. No joint deformity upper and lower extremities. Normal muscle tone.  Skin: dry, intact, normal color, normal temperature Neurologic: CN 2-12 grossly intact. Normal speech.  Grossly non-focal exam.  Patient does not follow commands. Psychiatric: Alert and oriented to self, hospital.  Irritable mood. Congruent affect.  Abnormal judgement and insight.   Labs on Admission: I have personally reviewed following labs and imaging studies  CBC: Recent Labs  Lab 05/15/19 1037  WBC 10.0  NEUTROABS 8.0*  HGB 10.0*  HCT 31.3*  MCV 92.9  PLT 99991111   Basic Metabolic Panel: Recent Labs  Lab 05/15/19 1037  NA 139  K 4.2  CL 110  CO2 18*  GLUCOSE 131*  BUN 61*  CREATININE 2.89*  CALCIUM 9.5   GFR: Estimated Creatinine Clearance: 16.3 mL/min (A) (by C-G formula based on SCr of 2.89 mg/dL (H)). Liver Function Tests: Recent Labs  Lab 05/15/19 1037  AST 19  ALT 19  ALKPHOS 60  BILITOT 0.4  PROT 6.5  ALBUMIN 3.4*   No results for input(s): LIPASE, AMYLASE in the last 168 hours. No results for input(s): AMMONIA in the last 168 hours. Coagulation Profile: No results for input(s): INR, PROTIME in the last 168 hours. Cardiac Enzymes: No results for input(s): CKTOTAL, CKMB, CKMBINDEX, TROPONINI in the last 168 hours. BNP (last 3  results) No results for input(s): PROBNP in the last 8760 hours. HbA1C: No results for input(s): HGBA1C in the last 72 hours. CBG: No results for input(s): GLUCAP in the last 168 hours. Lipid Profile: Recent Labs    05/15/19 1037  TRIG 180*   Thyroid Function Tests: No results for input(s): TSH, T4TOTAL, FREET4, T3FREE, THYROIDAB in the last 72 hours. Anemia Panel: Recent Labs    05/15/19 1037  FERRITIN 255   Urine analysis:    Component Value Date/Time   COLORURINE AMBER (A) 03/08/2019 2148   APPEARANCEUR TURBID (A) 03/08/2019 2148   LABSPEC 1.016 03/08/2019 2148   PHURINE 5.0 03/08/2019 2148   GLUCOSEU NEGATIVE 03/08/2019 2148   HGBUR NEGATIVE 03/08/2019 2148   [place] NEGATIVE 03/08/2019 2148   [place] NEGATIVE 03/08/2019 2148   PROTEINUR 100 (A) 03/08/2019 2148   NITRITE NEGATIVE 03/08/2019 2148   LEUKOCYTESUR LARGE (A) 03/08/2019 2148    Radiological Exams on Admission: DG Chest Portable 1 View  Result Date: 05/15/2019 CLINICAL  DATA:  COVID-19 positive, hypoxia. EXAM: PORTABLE CHEST 1 VIEW COMPARISON:  March 12, 2019. FINDINGS: Stable cardiomegaly. No pneumothorax or pleural effusion is noted. Left-sided pacemaker is unchanged in position. Both lungs are clear. The visualized skeletal structures are unremarkable. IMPRESSION: No active disease. Electronically Signed   By: Marijo Conception M.D.   On: 05/15/2019 11:20    EKG: Independently reviewed.   Assessment/Plan Active Problems:   Pneumonia due to COVID-19 virus   Pneumonia Due to COVID-19 Virus Infection Presented with progressive shortness of breath, hypoxia, cough, with chest x-ray showing bilateral multifocal pneumonia and Covid test positive in addition to known exposure previously. --Date of Dx: 05/01/2019 --Oxygen requirements: none in ED --Antibiotics: Procalcitonin negative, antibiotics held --Diuretics: No reported history of CHF, clinically euvolemic --Vitamin C and Zinc: Per  protocol --Remdesivir: Started on  05/15/2019 --Steroids: held for now since no hypoxia and to prevent behavioral side effects (has FTD) --Actemra: not indication --maintain airborne and contact precautions --supplemental oxygen, maintain o2 sat > 90% --Combivent inhaler --vitamin C and zinc --Tussionex and Robitussin --encourage patient to maintain awake prone positive for 16+hours a day as possible, minimum 2-3 hours at night --maintain euvolemia to net-neg fluid balance  Continue home meds for chronic conditions pending med rec completion.   DVT prophylaxis: heparin Code Status: Full - confirmed with patient's daughter  Family Communication: daughter at bedside   Disposition Plan: expect d/c to [facility] pending clinical course  Consults called: None Admission status: inpatient  Severity of Illness: The appropriate patient status for this patient is INPATIENT. It is not anticipated that the patient will be medically stable for discharge from the hospital within 2 midnights of admission. The following factors support the patient status of inpatient.  "           Presenting symptoms include profound weakness, hypoxia at SNF and with EMS. "           Worrisome physical exam findings include diminshed breath sounds at bases. "           Initial radiographic and laboratory data are worrisome because of elevated d-dimer, fibrinogen, worsening renal function. "           Chronic co-morbidities include frontotemporal dementia, diabetes, CKD, anemia, hypothyroidism, bradycardia.   * I certify that at the point of admission it is my clinical judgment that the patient will require inpatient hospital care spanning beyond 2 midnights from the point of admission due to high intensity of service, high risk for further deterioration and high frequency of surveillance required.*     Ezekiel Slocumb, DO [facility]   If 7PM-7AM, please contact  night-coverage [email or web address]  05/15/2019, 12:51 PM

## 2019-05-15 NOTE — Progress Notes (Signed)
Patient is confused. Oriented to self only. Patient has a bed alarm and bed posy in place and floor matts. Call bell in reach. [place] sign at the door. Red rash is noted under her rt breast. Dr Cruzita Lederer notified.

## 2019-05-15 NOTE — Progress Notes (Signed)
1915 Report received care assumed Pt resting in bed with eyes open. Easily agitated when addressed. Pt daughter (POA)  [name] called and updated. 1930 MD paged regarding order for tele-sitter. Per daughter pt known to try and get out of be , agitated easily and combative. Per report pt combative, agitates easily, tries to get out of bed. Posey and matts in place and  Bed alarm on. Will continue to monitor

## 2019-05-15 NOTE — ED Provider Notes (Addendum)
[place] [place] [facility] DEPT Provider Note   CSN: [id number] Arrival date & time: 05/15/19  1018     History Chief Complaint  Patient presents with  . COVID+    Daisy Parker is a 79 y.o. female. Level 5 caveat due to dementia. HPI Patient presents with hypoxia from the nursing home.  Reportedly sats on room air of the 80s.  Reportedly did have sats in the 80s by EMS also.  However arrived on no oxygen with sats of 100.  Reportedly had positive Covid test although we do not have access to this.  Patient has dementia.  Complaining of abdominal pain.    Past Medical History:  Diagnosis Date  . Anemia   . Bradycardia   . Cataract   . CKD (chronic kidney disease), stage IV ([place])   . Dementia ([place])   . Duodenal perforation ([place])   . Hypertension   . Hypothyroidism   . Syncope     Patient Active Problem List   Diagnosis Date Noted  . Urinary frequency 03/13/2019  . Palliative care by specialist   . DNR (do not resuscitate) discussion   . Bradycardia   . Mitral valve mass   . Second degree AV block, Mobitz type I   . Second degree AV block, Mobitz type II   . RBBB   . Syncope 03/08/2019  . AKI (acute kidney injury) ([place]) 05/16/2018  . Encephalopathy acute 05/14/2018  . Dementia ([place]) 05/14/2018  . HTN (hypertension) 05/14/2018  . CKD (chronic kidney disease) 05/14/2018    Past Surgical History:  Procedure Laterality Date  . PACEMAKER IMPLANT N/A 03/11/2019   Procedure: PACEMAKER IMPLANT;  Surgeon: [name], MD;  Location: [place] [facility];  Service: Cardiovascular;  Laterality: N/A;     OB History   No obstetric history on file.     Family History  Family history unknown: Yes    Social History   Tobacco Use  . Smoking status: Former Research scientist (life sciences)  . Smokeless tobacco: Current User  Substance Use Topics  . Alcohol use: Never  . Drug use: Never    Home Medications Prior to Admission medications   Medication Sig Start Date  End Date Taking? Authorizing Provider  acetaminophen (TYLENOL) 500 MG tablet Take 1,000 mg by mouth 3 (three) times daily. 0800, 1400, 2000.    [provider]  allopurinol (ZYLOPRIM) 100 MG tablet Take 100 mg by mouth daily.    [provider]  amLODipine (NORVASC) 10 MG tablet Take 1 tablet (10 mg total) by mouth at bedtime. 03/13/19   Marianna Payment, MD  carvedilol (COREG) 6.25 MG tablet Take 1 tablet (6.25 mg total) by mouth 2 (two) times daily with a meal. 03/13/19   Marianna Payment, MD  Cholecalciferol (VITAMIN D) 50 MCG (2000 UT) tablet Take 2,000 Units by mouth daily.    [provider]  diclofenac sodium (VOLTAREN) 1 % GEL Apply 2 g topically 3 (three) times daily. To knees, hips, and lower back.    [provider]  fluticasone (FLONASE) 50 MCG/ACT nasal spray Place 1 spray into both nostrils daily.    [provider]  latanoprost (XALATAN) 0.005 % ophthalmic solution Place 1 drop into both eyes at bedtime.    [provider]  levothyroxine (SYNTHROID) 150 MCG tablet Take 150 mcg by mouth daily before breakfast.    [provider]  Multiple Vitamins-Iron (DAILY VITE MULTIVITAMIN/IRON) TABS Take 1 tablet by mouth daily.    [provider]  OLANZapine (ZYPREXA) 7.5 MG tablet Take 7.5 mg by mouth at bedtime.    [provider]  polyethylene glycol (MIRALAX / GLYCOLAX) packet Take 17 g by mouth daily. 05/17/18   Dorrell, Andree Elk, MD    Allergies    Codeine and Sulfa antibiotics  Review of Systems   Review of Systems  Unable to perform ROS: Dementia    Physical Exam Updated Vital Signs BP 111/68   Pulse (!) 59   Temp 97.9 F (36.6 C) (Oral)   Resp 11   Ht 5\' 6"  (1.676 m)   Wt 75 kg   SpO2 99%   BMI 26.69 kg/m   Physical Exam Vitals and nursing note reviewed.  HENT:     Head: Normocephalic.  Eyes:     Pupils: Pupils are equal, round, and reactive to light.  Cardiovascular:     Rate and Rhythm:  Regular rhythm.  Pulmonary:     Breath sounds: No wheezing, rhonchi or rales.  Abdominal:     Tenderness: There is abdominal tenderness.     Comments: Left lower quadrant tenderness without rebound or guarding  Musculoskeletal:     Cervical back: Neck supple.     Right lower leg: No edema.     Left lower leg: No edema.  Skin:    Capillary Refill: Capillary refill takes less than 2 seconds.  Neurological:     Mental Status: She is alert and oriented to person, place, and time.  Psychiatric:        Mood and Affect: Mood normal.     ED Results / Procedures / Treatments   Labs (all labs ordered are listed, but only abnormal results are displayed) Labs Reviewed  VALPROIC ACID LEVEL - Abnormal; Notable for the following components:      Result Value   Valproic Acid Lvl <10 (*)    All other components within normal limits  CBC WITH DIFFERENTIAL/PLATELET - Abnormal; Notable for the following components:   RBC 3.37 (*)    Hemoglobin 10.0 (*)    HCT 31.3 (*)    Neutro Abs 8.0 (*)    Abs Immature Granulocytes 0.14 (*)    All other components within normal limits  COMPREHENSIVE METABOLIC PANEL - Abnormal; Notable for the following components:   CO2 18 (*)    Glucose, Bld 131 (*)    BUN 61 (*)    Creatinine, Ser 2.89 (*)    Albumin 3.4 (*)    GFR calc non Af Amer 15 (*)    GFR calc Af Amer 17 (*)    All other components within normal limits  D-DIMER, QUANTITATIVE (NOT AT [facility]) - Abnormal; Notable for the following components:   D-Dimer, Quant 1.89 (*)    All other components within normal limits  TRIGLYCERIDES - Abnormal; Notable for the following components:   Triglycerides 180 (*)    All other components within normal limits  FIBRINOGEN - Abnormal; Notable for the following components:   Fibrinogen 643 (*)    All other components within normal limits  CULTURE, BLOOD (ROUTINE X 2)  CULTURE, BLOOD (ROUTINE X 2)  LACTIC ACID, PLASMA  LACTATE DEHYDROGENASE  FERRITIN    C-REACTIVE PROTEIN  LACTIC ACID, PLASMA  PROCALCITONIN    EKG None ED ECG REPORT   Date: 05/15/2019  Rate: 62  Rhythm: paced   Radiology DG Chest Portable 1 View  Result Date: 05/15/2019 CLINICAL DATA:  COVID-19 positive, hypoxia. EXAM: PORTABLE CHEST 1 VIEW COMPARISON:  March 12, 2019. FINDINGS: Stable cardiomegaly. No pneumothorax or pleural effusion is noted. Left-sided pacemaker is unchanged in position. Both lungs are clear. The visualized skeletal structures are unremarkable. IMPRESSION: No active disease. Electronically Signed   By: Marijo Conception M.D.   On: 05/15/2019 11:20    Procedures Procedures (including critical care time)  Medications Ordered in ED Medications - No data to display  ED Course  I have reviewed the triage vital signs and the nursing notes.  Pertinent labs & imaging results that were available during my care of the patient were reviewed by me and considered in my medical decision making (see chart for details).    MDM Rules/Calculators/A&P                     Patient presents with hypoxia.  Reportedly had sats of 80% in the nursing home.  Reportedly also had sats 80% for EMS, however has had good oxygenation here.  Increased creatinine up to 2.9 from recent 2.  I think with the hypoxia even if it was not here patient may benefit from [place] to the hospital.  Will discuss with hospitalist.  Positive Covid test on the 11th and documentation being sent from nursing home      Final Clinical Impression(s) / ED Diagnoses Final diagnoses:  Hypoxia  COVID-19    Rx / DC Orders ED Discharge Orders    None       Davonna Belling, MD 05/15/19 1218    Davonna Belling, MD 05/15/19 [phone]

## 2019-05-15 NOTE — ED Triage Notes (Signed)
Patient from [facility], tested COVID+ (date unknown). Staff reports increasing lethargy and low O2 (low 80s). Denies fever. Hx of  Dementia, patient at baseline.   126/63 CBG 150 97.5 T

## 2019-05-15 NOTE — ED Notes (Signed)
Spoke with staff member twice at [facility] to fax COVID results to ED. Per staff member, results are being faxed now.

## 2019-05-16 DIAGNOSIS — J1282 Pneumonia due to coronavirus disease 2019: Secondary | ICD-10-CM

## 2019-05-16 LAB — URINALYSIS, ROUTINE W REFLEX MICROSCOPIC
Bilirubin Urine: NEGATIVE
Glucose, UA: NEGATIVE mg/dL
Hgb urine dipstick: NEGATIVE
Ketones, ur: NEGATIVE mg/dL
Nitrite: NEGATIVE
Protein, ur: 100 mg/dL — AB
Specific Gravity, Urine: 1.019 (ref 1.005–1.030)
WBC, UA: >50 WBC/hpf — ABNORMAL HIGH (ref 0–5)
pH: 5 (ref 5.0–8.0)

## 2019-05-16 LAB — COMPREHENSIVE METABOLIC PANEL
ALT: 19 U/L (ref 0–44)
AST: 15 U/L (ref 15–41)
Albumin: 3.1 g/dL — ABNORMAL LOW (ref 3.5–5.0)
Alkaline Phosphatase: 58 U/L (ref 38–126)
Anion gap: 13 (ref 5–15)
BUN: 66 mg/dL — ABNORMAL HIGH (ref 8–23)
CO2: 15 mmol/L — ABNORMAL LOW (ref 22–32)
Calcium: 9.3 mg/dL (ref 8.9–10.3)
Chloride: 110 mmol/L (ref 98–111)
Creatinine, Ser: 2.8 mg/dL — ABNORMAL HIGH (ref 0.44–1.00)
GFR calc Af Amer: 18 mL/min — ABNORMAL LOW (ref >60)
GFR calc non Af Amer: 15 mL/min — ABNORMAL LOW (ref >60)
Glucose, Bld: 191 mg/dL — ABNORMAL HIGH (ref 70–99)
Potassium: 4.6 mmol/L (ref 3.5–5.1)
Sodium: 138 mmol/L (ref 135–145)
Total Bilirubin: 0.3 mg/dL (ref 0.3–1.2)
Total Protein: 6.1 g/dL — ABNORMAL LOW (ref 6.5–8.1)

## 2019-05-16 LAB — CBC
HCT: 28.7 % — ABNORMAL LOW (ref 36.0–46.0)
Hemoglobin: 9.3 g/dL — ABNORMAL LOW (ref 12.0–15.0)
MCH: 29.2 pg (ref 26.0–34.0)
MCHC: 32.4 g/dL (ref 30.0–36.0)
MCV: 90 fL (ref 80.0–100.0)
Platelets: 185 10*3/uL (ref 150–400)
RBC: 3.19 MIL/uL — ABNORMAL LOW (ref 3.87–5.11)
RDW: 14.1 % (ref 11.5–15.5)
WBC: 4.9 10*3/uL (ref 4.0–10.5)
nRBC: 0 % (ref 0.0–0.2)

## 2019-05-16 LAB — PHOSPHORUS: Phosphorus: 4 mg/dL (ref 2.5–4.6)

## 2019-05-16 LAB — C-REACTIVE PROTEIN: CRP: 1.8 mg/dL — ABNORMAL HIGH (ref <1.0)

## 2019-05-16 LAB — BRAIN NATRIURETIC PEPTIDE: B Natriuretic Peptide: 156.3 pg/mL — ABNORMAL HIGH (ref 0.0–100.0)

## 2019-05-16 LAB — MAGNESIUM: Magnesium: 2 mg/dL (ref 1.7–2.4)

## 2019-05-16 LAB — D-DIMER, QUANTITATIVE: D-Dimer, Quant: 1.77 ug/mL-FEU — ABNORMAL HIGH (ref 0.00–0.50)

## 2019-05-16 MED ORDER — LEVOTHYROXINE SODIUM 75 MCG PO TABS
150.0000 ug | ORAL_TABLET | Freq: Every day | ORAL | Status: DC
  Administered 2019-05-16 – 2019-05-21 (×6): 150 ug via ORAL
  Filled 2019-05-16 (×6): qty 2

## 2019-05-16 MED ORDER — SODIUM CHLORIDE 0.9 % IV SOLN
1.0000 g | INTRAVENOUS | Status: AC
  Administered 2019-05-16 – 2019-05-20 (×5): 1 g via INTRAVENOUS
  Filled 2019-05-16 (×2): qty 10
  Filled 2019-05-16: qty 1
  Filled 2019-05-16 (×2): qty 10

## 2019-05-16 MED ORDER — DIVALPROEX SODIUM 125 MG PO CSDR
125.0000 mg | DELAYED_RELEASE_CAPSULE | Freq: Two times a day (BID) | ORAL | Status: DC
  Administered 2019-05-16 – 2019-05-21 (×11): 125 mg via ORAL
  Filled 2019-05-16 (×12): qty 1

## 2019-05-16 MED ORDER — SODIUM BICARBONATE 650 MG PO TABS
650.0000 mg | ORAL_TABLET | Freq: Two times a day (BID) | ORAL | Status: DC
  Administered 2019-05-16 – 2019-05-21 (×11): 650 mg via ORAL
  Filled 2019-05-16 (×13): qty 1

## 2019-05-16 MED ORDER — AMLODIPINE BESYLATE 10 MG PO TABS
10.0000 mg | ORAL_TABLET | Freq: Every day | ORAL | Status: DC
  Administered 2019-05-16 – 2019-05-20 (×5): 10 mg via ORAL
  Filled 2019-05-16 (×5): qty 1

## 2019-05-16 MED ORDER — SODIUM CHLORIDE 0.9 % IV SOLN: INTRAVENOUS | Status: AC

## 2019-05-16 MED ORDER — CARVEDILOL 3.125 MG PO TABS
6.2500 mg | ORAL_TABLET | Freq: Two times a day (BID) | ORAL | Status: DC
  Administered 2019-05-16 – 2019-05-21 (×11): 6.25 mg via ORAL
  Filled 2019-05-16 (×11): qty 2

## 2019-05-16 MED ORDER — FLUTICASONE PROPIONATE 50 MCG/ACT NA SUSP
1.0000 | Freq: Every day | NASAL | Status: DC
  Administered 2019-05-16 – 2019-05-21 (×6): 1 via NASAL
  Filled 2019-05-16: qty 16

## 2019-05-16 NOTE — TOC Initial Note (Signed)
Transition of Care [facility]) - Initial/Assessment Note    Patient Details  Name: Daisy Parker MRN: [id number] Date of Birth: 10/01/1940  Transition of [facility]) CM/SW Contact:    Shade Flood, LCSW Phone Number: 05/16/2019, 3:58 PM  Clinical Narrative:                  Pt admitted from [place]. She is active with [facility] for RN and PT. Anticipating pt will return to [facility].  TOC will follow and assist with dc planning.  Expected Discharge Plan: Assisted Living Barriers to Discharge: Continued Medical Work up   Patient Goals and CMS Choice        Expected Discharge Plan and Services Expected Discharge Plan: Assisted Living In-house Referral: Clinical Social Work     Living arrangements for the past 2 months: [place]                             [facility] Agency: [facility]        Prior Living Arrangements/Services Living arrangements for the past 2 months: [place] Lives with:: Facility Resident Patient language and need for interpreter reviewed:: Yes Do you feel safe going back to the place where you live?: Yes      Need for Family Participation in Patient Care: No (Comment) Care giver support system in place?: Yes (comment) Current home services: Home PT, Home RN Criminal Activity/Legal Involvement Pertinent to Current Situation/Hospitalization: No - Comment as needed  Activities of Daily Living Home Assistive Devices/Equipment: Blood pressure cuff, Grab bars around toilet, Grab bars in shower, Hand-held shower hose, Hospital bed, Walker (specify type), Scales, Wheelchair(front wheeled walker) ADL Screening (condition at time of admission) Patient's cognitive ability adequate to safely complete daily activities?: No Is the patient deaf or have difficulty hearing?: Yes(slight hoh) Does the patient have difficulty seeing, even when wearing  glasses/contacts?: No Does the patient have difficulty concentrating, remembering, or making decisions?: Yes Patient able to express need for assistance with ADLs?: No(patient very lethargic) Does the patient have difficulty dressing or bathing?: Yes Independently performs ADLs?: No Communication: Independent Dressing (OT): Dependent Is this a change from baseline?: Change from baseline, expected to last >3 days Grooming: Dependent Is this a change from baseline?: Change from baseline, expected to last >3 days Feeding: Dependent Is this a change from baseline?: Change from baseline, expected to last >3 days Bathing: Dependent Is this a change from baseline?: Change from baseline, expected to last >3 days Toileting: Dependent Is this a change from baseline?: Change from baseline, expected to last >3days In/Out Bed: Dependent Is this a change from baseline?: Change from baseline, expected to last >3 days Walks in Home: Dependent Is this a change from baseline?: Change from baseline, expected to last >3 days Does the patient have difficulty walking or climbing stairs?: Yes(secondary to weakness and lethargy) Weakness of Legs: Both Weakness of Arms/Hands: Both  Permission Sought/Granted                  Emotional Assessment       Orientation: : Oriented to Self Alcohol / Substance Use: Not Applicable Psych Involvement: No (comment)  Admission diagnosis:  Hypoxia [R09.02] COVID-19 virus infection [U07.1] Pneumonia due to COVID-19 virus [U07.1, J12.82] COVID-19 [U07.1] Patient Active Problem List   Diagnosis Date Noted  . Pneumonia due to COVID-19 virus 05/15/2019  .  COVID-19 virus infection 05/15/2019  . Urinary frequency 03/13/2019  . Palliative care by specialist   . DNR (do not resuscitate) discussion   . Bradycardia   . Mitral valve mass   . Second degree AV block, Mobitz type I   . Second degree AV block, Mobitz type II   . RBBB   . Syncope 03/08/2019  . AKI  (acute kidney injury) ([place]) 05/16/2018  . Encephalopathy acute 05/14/2018  . Dementia ([place]) 05/14/2018  . HTN (hypertension) 05/14/2018  . CKD (chronic kidney disease) 05/14/2018   PCP:  Patient, No Pcp Per Pharmacy:   Ardeth Perfect, [place] [id number] [place] [address] Phone: [phone] Fax: [phone]     Social Determinants of Health (SDOH) Interventions    Readmission Risk Interventions No flowsheet data found.

## 2019-05-16 NOTE — Progress Notes (Signed)
1915 Report received and care assumed. Pt resting in bed with call bell in reach NADN Bed alarm in place. Needs met Will continue to monitor

## 2019-05-16 NOTE — Plan of Care (Signed)
  Problem: Respiratory: Goal: Will maintain a patent airway Outcome: Progressing Goal: Complications related to the disease process, condition or treatment will be avoided or minimized Outcome: Progressing   

## 2019-05-16 NOTE — Progress Notes (Addendum)
PROGRESS NOTE    Daisy Parker  [id number] DOB: 05-May-1940 DOA: 05/15/2019 PCP: Patient, No Pcp Per   Brief Narrative:  79 year old with history of frontotemporal dementia, HTN, hypothyroidism, CKD stage III, bradycardia and anemia presented from [facility] with complains of generalized profound weakness and hypoxia.  She was initially tested positive for COVID-19 on 1/20.  Upon admission she had slightly elevated inflammatory markers, chest x-ray was negative, had acute kidney injury.  Assessment & Plan:   Principal Problem:   Pneumonia due to COVID-19 virus Active Problems:   Dementia ([place])   HTN (hypertension)   AKI (acute kidney injury) ([place])   COVID-19 virus infection  Acute hypoxic respiratory distress secondary to COVID-19 pneumonia -Oxygen levels-room air -Remdesivir-day 2 -Decadrone-day 2 -Routine: Labs have been reviewed including ferritin, LDH, CRP, d-dimer, fibrinogen.  Will need to trend this lab daily. -Vitamin C & Zinc. Prone >16hrs/day.  -procalcitonin-negative -Chest x-ray-no acute findings -Supportive care-antitussive, inhalers, I-S/flutter -CODE STATUS confirmed -BNP 156, unable to give Lasix due to intravascular volume depletion.  Acute kidney injury on CKD stage IIIb Acute metabolic acidosis -Baseline creatinine 2.0.  Admission creatinine 2.89. -Check bladder scan, UA -Sodium bicarb twice daily  Essential hypertension -Norvasc and Coreg  Hypothyroidism -Synthroid  Anemia of chronic disease -Baseline hemoglobin 9.10.  Currently at baseline without any evidence of bleeding.  Dementia -On daily olanzapine  DVT prophylaxis: Subcutaneous heparin Code Status: Full code Family Communication:  Spoke with daughter [name].  Disposition Plan:   Patient From= [facility]  Patient Anticipated D/C place= [facility]  Barriers= once her acute kidney injury and metabolic acidosis has improved.  Hopefully over next 24-48  hours.  Consultants:   None  Procedures:   None  Antimicrobials:   None   Subjective: Pleasantly confused this morning, denies any shortness of breath and other complaints.  She is not able to tell me why she came to the hospital.  Review of Systems Otherwise negative except as per HPI, including: General: Denies fever, chills, night sweats or unintended weight loss. Resp: Denies cough, wheezing, shortness of breath. Cardiac: Denies chest pain, palpitations, orthopnea, paroxysmal nocturnal dyspnea. GI: Denies abdominal pain, nausea, vomiting, diarrhea or constipation GU: Denies dysuria, frequency, hesitancy or incontinence MS: Denies muscle aches, joint pain or swelling Neuro: Denies headache, neurologic deficits (focal weakness, numbness, tingling), abnormal gait Psych: Denies anxiety, depression, SI/HI/AVH Skin: Denies new rashes or lesions ID: Denies sick contacts, exotic exposures, travel  Examination:  General exam: Not in acute distress, pleasantly confused Respiratory system: Bibasilar rhonchi Cardiovascular system: S1 & S2 heard, RRR. No JVD, murmurs, rubs, gallops or clicks. No pedal edema. Gastrointestinal system: Abdomen is nondistended, soft and nontender. No organomegaly or masses felt. Normal bowel sounds heard. Central nervous system: Alert to name only.  No focal neurological deficits. Extremities: Symmetric 4 x 5 power. Skin: No rashes, lesions or ulcers Psychiatry: Poor judgment and insight  External Foley catheter in place  Objective: Vitals:   05/15/19 1330 05/15/19 1530 05/15/19 1758 05/15/19 2100  BP: 129/72 126/72 116/69   Pulse: (!) 59 63    Resp: 16 16    Temp:   98.7 F (37.1 C) 98.7 F (37.1 C)  TempSrc:   Oral Oral  SpO2: 100% 100%  100%  Weight:      Height:        Intake/Output Summary (Last 24 hours) at 05/16/2019 0725 Last data filed at 05/15/2019 1800 Gross per 24 hour  Intake 291.89 ml  Output --  Net 291.89 ml   Filed  Weights   05/15/19 1027  Weight: 75 kg     Data Reviewed:   CBC: Recent Labs  Lab 05/15/19 1037 05/16/19 0400  WBC 10.0 4.9  NEUTROABS 8.0*  --   HGB 10.0* 9.3*  HCT 31.3* 28.7*  MCV 92.9 90.0  PLT 190 [id number]   Basic Metabolic Panel: Recent Labs  Lab 05/15/19 1037 05/16/19 0400  NA 139 138  K 4.2 4.6  CL 110 110  CO2 18* 15*  GLUCOSE 131* 191*  BUN 61* 66*  CREATININE 2.89* 2.80*  CALCIUM 9.5 9.3  MG  --  2.0  PHOS  --  4.0   GFR: Estimated Creatinine Clearance: 16.9 mL/min (A) (by C-G formula based on SCr of 2.8 mg/dL (H)). Liver Function Tests: Recent Labs  Lab 05/15/19 1037 05/16/19 0400  AST 19 15  ALT 19 19  ALKPHOS 60 58  BILITOT 0.4 0.3  PROT 6.5 6.1*  ALBUMIN 3.4* 3.1*   No results for input(s): LIPASE, AMYLASE in the last 168 hours. No results for input(s): AMMONIA in the last 168 hours. Coagulation Profile: No results for input(s): INR, PROTIME in the last 168 hours. Cardiac Enzymes: No results for input(s): CKTOTAL, CKMB, CKMBINDEX, TROPONINI in the last 168 hours. BNP (last 3 results) No results for input(s): PROBNP in the last 8760 hours. HbA1C: No results for input(s): HGBA1C in the last 72 hours. CBG: No results for input(s): GLUCAP in the last 168 hours. Lipid Profile: Recent Labs    05/15/19 1037  TRIG 180*   Thyroid Function Tests: No results for input(s): TSH, T4TOTAL, FREET4, T3FREE, THYROIDAB in the last 72 hours. Anemia Panel: Recent Labs    05/15/19 1037  FERRITIN 255   Sepsis Labs: Recent Labs  Lab 05/15/19 1037 05/15/19 1237  PROCALCITON <0.10  --   LATICACIDVEN 1.4 0.9    No results found for this or any previous visit (from the past 240 hour(s)).       Radiology Studies: DG Chest Portable 1 View  Result Date: 05/15/2019 CLINICAL DATA:  COVID-19 positive, hypoxia. EXAM: PORTABLE CHEST 1 VIEW COMPARISON:  March 12, 2019. FINDINGS: Stable cardiomegaly. No pneumothorax or pleural effusion is noted.  Left-sided pacemaker is unchanged in position. Both lungs are clear. The visualized skeletal structures are unremarkable. IMPRESSION: No active disease. Electronically Signed   By: Marijo Conception M.D.   On: 05/15/2019 11:20        Scheduled Meds: . vitamin C  500 mg Oral Daily  . dexamethasone (DECADRON) injection  6 mg Intravenous Daily  . heparin  5,000 Units Subcutaneous Q8H  . Ipratropium-Albuterol  1 puff Inhalation Q6H  . nystatin cream   Topical BID  . OLANZapine  7.5 mg Oral QHS  . zinc sulfate  220 mg Oral Daily   Continuous Infusions: . remdesivir 100 mg in NS 100 mL       LOS: 1 day   Time spent= 35 mins    Marcas Bowsher Arsenio Loader, MD [facility]  If 7PM-7AM, please contact night-coverage  05/16/2019, 7:25 AM

## 2019-05-17 DIAGNOSIS — N390 Urinary tract infection, site not specified: Secondary | ICD-10-CM

## 2019-05-17 HISTORY — DX: Urinary tract infection, site not specified: N39.0

## 2019-05-17 LAB — COMPREHENSIVE METABOLIC PANEL
ALT: 19 U/L (ref 0–44)
AST: 18 U/L (ref 15–41)
Albumin: 2.9 g/dL — ABNORMAL LOW (ref 3.5–5.0)
Alkaline Phosphatase: 52 U/L (ref 38–126)
Anion gap: 9 (ref 5–15)
BUN: 77 mg/dL — ABNORMAL HIGH (ref 8–23)
CO2: 17 mmol/L — ABNORMAL LOW (ref 22–32)
Calcium: 9.1 mg/dL (ref 8.9–10.3)
Chloride: 111 mmol/L (ref 98–111)
Creatinine, Ser: 3.23 mg/dL — ABNORMAL HIGH (ref 0.44–1.00)
GFR calc Af Amer: 15 mL/min — ABNORMAL LOW (ref >60)
GFR calc non Af Amer: 13 mL/min — ABNORMAL LOW (ref >60)
Glucose, Bld: 192 mg/dL — ABNORMAL HIGH (ref 70–99)
Potassium: 4.8 mmol/L (ref 3.5–5.1)
Sodium: 137 mmol/L (ref 135–145)
Total Bilirubin: 0.4 mg/dL (ref 0.3–1.2)
Total Protein: 5.6 g/dL — ABNORMAL LOW (ref 6.5–8.1)

## 2019-05-17 LAB — CBC
HCT: 26.6 % — ABNORMAL LOW (ref 36.0–46.0)
Hemoglobin: 8.7 g/dL — ABNORMAL LOW (ref 12.0–15.0)
MCH: 29.4 pg (ref 26.0–34.0)
MCHC: 32.7 g/dL (ref 30.0–36.0)
MCV: 89.9 fL (ref 80.0–100.0)
Platelets: 171 10*3/uL (ref 150–400)
RBC: 2.96 MIL/uL — ABNORMAL LOW (ref 3.87–5.11)
RDW: 13.9 % (ref 11.5–15.5)
WBC: 8.2 10*3/uL (ref 4.0–10.5)
nRBC: 0 % (ref 0.0–0.2)

## 2019-05-17 LAB — PHOSPHORUS: Phosphorus: 3.4 mg/dL (ref 2.5–4.6)

## 2019-05-17 LAB — C-REACTIVE PROTEIN: CRP: 1.4 mg/dL — ABNORMAL HIGH (ref <1.0)

## 2019-05-17 LAB — D-DIMER, QUANTITATIVE: D-Dimer, Quant: 1.72 ug/mL-FEU — ABNORMAL HIGH (ref 0.00–0.50)

## 2019-05-17 LAB — MAGNESIUM: Magnesium: 2 mg/dL (ref 1.7–2.4)

## 2019-05-17 MED ORDER — SODIUM CHLORIDE 0.9 % IV SOLN: INTRAVENOUS | Status: DC

## 2019-05-17 NOTE — Progress Notes (Signed)
PROGRESS NOTE    Daisy Parker  [id number] DOB: April 08, 1941 DOA: 05/15/2019 PCP: Patient, No Pcp Per   Brief Narrative:  79 year old with history of frontotemporal dementia, HTN, hypothyroidism, CKD stage III, bradycardia and anemia presented from [facility] with complains of generalized profound weakness and hypoxia.  She was initially tested positive for COVID-19 on 1/20.  Upon admission she had slightly elevated inflammatory markers, chest x-ray was negative, had acute kidney injury.  Also diagnosed with urinary tract infection therefore started on IV Rocephin.  Assessment & Plan:   Principal Problem:   Pneumonia due to COVID-19 virus Active Problems:   Dementia ([place])   HTN (hypertension)   AKI (acute kidney injury) ([place])   COVID-19 virus infection   Acute lower UTI  Acute hypoxic respiratory distress secondary to COVID-19 pneumonia -Oxygen levels-room air and stable -Remdesivir-day 3 -Decadrone-day 3 -Routine: Labs have been reviewed including ferritin, LDH, CRP, d-dimer, fibrinogen.  Will need to trend this lab daily. -Vitamin C & Zinc. Prone >16hrs/day.  -procalcitonin-negative -Chest x-ray-no acute findings -Supportive care-antitussive, inhalers, I-S/flutter -CODE STATUS confirmed -BNP 156, unable to give Lasix due to intravascular volume depletion.  Acute urinary tract infection without hematuria -IV Rocephin D2/5.  Gentle hydration.  Already received antibiotics prior to sending cultures  Acute kidney injury on CKD stage IIIb Acute metabolic acidosis -Baseline creatinine 2.0.  Admission creatinine 2.89.  Slightly worsened to 3.2 -Bladder scan showed 106 cc.  UA-UTI -Gentle hydration due to intravascular depletion. -Sodium bicarb tabs  Essential hypertension -Norvasc and Coreg  Hypothyroidism -Synthroid  Anemia of chronic disease -Baseline hemoglobin 9.10.  Currently at baseline without any evidence of bleeding.  Dementia -On daily olanzapine  DVT  prophylaxis: Subcutaneous heparin Code Status: Full code Family Communication: Called her daughter [name] today, no answer. Disposition Plan:   Patient From= [facility]  Patient Anticipated D/C place= [facility]  Barriers= maintain hospital stay to ensure her acute kidney injury and metabolic acidosis improves.  Her oral intake is inconsistent therefore requires hospital stay for IV fluids.  Consultants:   None  Procedures:   None  Antimicrobials:   None   Subjective: Pleasantly confused, denies any complaints.  Her urine appears to be slightly lighter in color than yesterday.  Bladder scan shows 106 cc.  Somewhat poor oral intake.  Review of Systems Otherwise negative except as per HPI, including: General = no fevers, chills, dizziness,  fatigue HEENT/EYES = negative for loss of vision, double vision, blurred vision,  sore throa Cardiovascular= negative for chest pain, palpitation Respiratory/lungs= negative for shortness of breath, cough, wheezing; hemoptysis,  Gastrointestinal= negative for nausea, vomiting, abdominal pain Genitourinary= negative for Dysuria MSK = Negative for arthralgia, myalgias Neurology= Negative for headache, numbness, tingling  Psychiatry= Negative for suicidal and homocidal ideation Skin= Negative for Rash   Examination: Constitutional: Elderly frail.  Pleasantly confused. Respiratory: Some bibasilar rhonchi. Cardiovascular: Normal sinus rhythm, no rubs Abdomen: Nontender nondistended good bowel sounds Musculoskeletal: No edema noted Skin: No rashes seen Neurologic: CN 2-12 grossly intact.  And nonfocal.  Strength in all extremities 4/5. Psychiatric: Poor judgment and insight.  Alert to her name only.  External Foley catheter in place  Objective: Vitals:   05/16/19 1928 05/16/19 2300 05/17/19 0334 05/17/19 0715  BP: 132/77 133/68 137/73 137/78  Pulse: 88 61 78 72  Resp: 19 14 16 15   Temp: 98.5 F (36.9 C) 98.4 F (36.9 C)  97.6 F (36.4 C) 98.2 F (36.8 C)  TempSrc: Oral Oral Oral Oral  SpO2: 100%  97% 98%  Weight:      Height:        Intake/Output Summary (Last 24 hours) at 05/17/2019 1052 Last data filed at 05/17/2019 0830 Gross per 24 hour  Intake 300 ml  Output 200 ml  Net 100 ml   Filed Weights   05/15/19 1027  Weight: 75 kg     Data Reviewed:   CBC: Recent Labs  Lab 05/15/19 1037 05/16/19 0400 05/17/19 0105  WBC 10.0 4.9 8.2  NEUTROABS 8.0*  --   --   HGB 10.0* 9.3* 8.7*  HCT 31.3* 28.7* 26.6*  MCV 92.9 90.0 89.9  PLT 190 185 [id number]   Basic Metabolic Panel: Recent Labs  Lab 05/15/19 1037 05/16/19 0400 05/17/19 0105  NA 139 138 137  K 4.2 4.6 4.8  CL 110 110 111  CO2 18* 15* 17*  GLUCOSE 131* 191* 192*  BUN 61* 66* 77*  CREATININE 2.89* 2.80* 3.23*  CALCIUM 9.5 9.3 9.1  MG  --  2.0 2.0  PHOS  --  4.0 3.4   GFR: Estimated Creatinine Clearance: 14.6 mL/min (A) (by C-G formula based on SCr of 3.23 mg/dL (H)). Liver Function Tests: Recent Labs  Lab 05/15/19 1037 05/16/19 0400 05/17/19 0105  AST 19 15 18   ALT 19 19 19   ALKPHOS 60 58 52  BILITOT 0.4 0.3 0.4  PROT 6.5 6.1* 5.6*  ALBUMIN 3.4* 3.1* 2.9*   No results for input(s): LIPASE, AMYLASE in the last 168 hours. No results for input(s): AMMONIA in the last 168 hours. Coagulation Profile: No results for input(s): INR, PROTIME in the last 168 hours. Cardiac Enzymes: No results for input(s): CKTOTAL, CKMB, CKMBINDEX, TROPONINI in the last 168 hours. BNP (last 3 results) No results for input(s): PROBNP in the last 8760 hours. HbA1C: No results for input(s): HGBA1C in the last 72 hours. CBG: No results for input(s): GLUCAP in the last 168 hours. Lipid Profile: Recent Labs    05/15/19 1037  TRIG 180*   Thyroid Function Tests: No results for input(s): TSH, T4TOTAL, FREET4, T3FREE, THYROIDAB in the last 72 hours. Anemia Panel: Recent Labs    05/15/19 1037  FERRITIN 255   Sepsis Labs: Recent Labs  Lab  05/15/19 1037 05/15/19 1237  PROCALCITON <0.10  --   LATICACIDVEN 1.4 0.9    Recent Results (from the past 240 hour(s))  Blood Culture (routine x 2)     Status: None (Preliminary result)   Collection Time: 05/15/19 11:00 AM   Specimen: BLOOD  Result Value Ref Range Status   Specimen Description   Final    BLOOD LEFT ANTECUBITAL Performed at [facility] [address] [place], [place] [address]    Special Requests   Final    BOTTLES DRAWN AEROBIC AND ANAEROBIC Blood Culture adequate volume Performed at [facility] [address] [place], [place] [address]    Culture   Final    NO GROWTH < 24 HOURS Performed at [facility] [address] [place], [place] [address]    Report Status PENDING  Incomplete  Blood Culture (routine x 2)     Status: None (Preliminary result)   Collection Time: 05/15/19 11:00 AM   Specimen: BLOOD  Result Value Ref Range Status   Specimen Description   Final    BLOOD RIGHT ANTECUBITAL Performed at [facility] [address] [place], [place] [address]    Special Requests   Final  BOTTLES DRAWN AEROBIC AND ANAEROBIC Blood Culture adequate volume Performed at [facility] [address] [place], [place] [address]    Culture   Final    NO GROWTH < 24 HOURS Performed at [facility] [address] [place],  [address]    Report Status PENDING  Incomplete         Radiology Studies: DG Chest Portable 1 View  Result Date: 05/15/2019 CLINICAL DATA:  COVID-19 positive, hypoxia. EXAM: PORTABLE CHEST 1 VIEW COMPARISON:  March 12, 2019. FINDINGS: Stable cardiomegaly. No pneumothorax or pleural effusion is noted. Left-sided pacemaker is unchanged in position. Both lungs are clear. The visualized skeletal structures are unremarkable. IMPRESSION: No active disease. Electronically Signed   By: Marijo Conception M.D.   On: 05/15/2019 11:20         Scheduled Meds: . amLODipine  10 mg Oral QHS  . vitamin C  500 mg Oral Daily  . carvedilol  6.25 mg Oral BID WC  . dexamethasone (DECADRON) injection  6 mg Intravenous Daily  . divalproex  125 mg Oral BID  . fluticasone  1 spray Each Nare Daily  . heparin  5,000 Units Subcutaneous Q8H  . Ipratropium-Albuterol  1 puff Inhalation Q6H  . levothyroxine  150 mcg Oral QAC breakfast  . nystatin cream   Topical BID  . OLANZapine  7.5 mg Oral QHS  . sodium bicarbonate  650 mg Oral BID  . zinc sulfate  220 mg Oral Daily   Continuous Infusions: . sodium chloride    . cefTRIAXone (ROCEPHIN)  IV 1 g (05/16/19 1715)  . remdesivir 100 mg in NS 100 mL 100 mg (05/17/19 0935)     LOS: 2 days   Time spent= 35 mins    Lexiana Spindel Arsenio Loader, MD [facility]  If 7PM-7AM, please contact night-coverage  05/17/2019, 10:52 AM

## 2019-05-17 NOTE — Plan of Care (Signed)
  Problem: Respiratory: Goal: Will maintain a patent airway Outcome: Progressing Goal: Complications related to the disease process, condition or treatment will be avoided or minimized Outcome: Progressing   

## 2019-05-17 NOTE — Progress Notes (Signed)
1300Pt recevied from nurse Rasheem  Pt alert to self Purewic in place Pt VSS.  Pt brought up by bed Assessment complete pt currently on R/A

## 2019-05-17 NOTE — TOC Progression Note (Addendum)
Transition of Care [facility]) - Progression Note    Patient Details  Name: Daisy Parker MRN: [id number] Date of Birth: February 12, 1941  Transition of Care [facility]) CM/SW Contact  Shade Flood, LCSW Phone Number: 05/17/2019, 11:38 AM  Clinical Narrative:     TOC following. Spoke with Tammy at pt's ALF to update. Per Tammy, she can take pt back Monday or after. She asks for phone update and fax of dc info for her review before transport arranged. Fax is [phone].  Per Tammy, pt ambulated independently with her walker prior to admission. She is min assist for ADLs.  Updated MD and informed of need for resumption of HH PT, RN orders at dc.  TOC will follow.  Expected Discharge Plan: Assisted Living Barriers to Discharge: Continued Medical Work up  Expected Discharge Plan and Services Expected Discharge Plan: Assisted Living In-house Referral: Clinical Social Work     Living arrangements for the past 2 months: [place]                             [facility] Agency: [facility]         Social Determinants of Health ([place]) Interventions    Readmission Risk Interventions No flowsheet data found.

## 2019-05-17 NOTE — TOC Progression Note (Signed)
Transition of Care [facility]) - Progression Note    Patient Details  Name: Valada Ankeney MRN: [id number] Date of Birth: 1940-07-15  Transition of Care [facility]) CM/SW Contact  Shade Flood, LCSW Phone Number: 05/17/2019, 12:14 PM  Clinical Narrative:     TOC following. Spoke with Tammy at pt's ALF to update. Per Tammy, she can take pt back Monday or after. She asks for phone update and fax of dc info for her review before transport arranged. Fax is [phone].  Per Tammy, pt ambulated independently with her walker prior to admission. She is min assist for ADLs.  Updated MD and informed of need for resumption of HH PT, RN orders at dc.  TOC will follow.  Expected Discharge Plan: Assisted Living Barriers to Discharge: Continued Medical Work up  Expected Discharge Plan and Services Expected Discharge Plan: Assisted Living In-house Referral: Clinical Social Work     Living arrangements for the past 2 months: [place]                             [facility] Agency: [facility]         Social Determinants of Health ([place]) Interventions    Readmission Risk Interventions No flowsheet data found.

## 2019-05-18 LAB — CBC
HCT: 26.5 % — ABNORMAL LOW (ref 36.0–46.0)
Hemoglobin: 8.6 g/dL — ABNORMAL LOW (ref 12.0–15.0)
MCH: 29.1 pg (ref 26.0–34.0)
MCHC: 32.5 g/dL (ref 30.0–36.0)
MCV: 89.5 fL (ref 80.0–100.0)
Platelets: 174 10*3/uL (ref 150–400)
RBC: 2.96 MIL/uL — ABNORMAL LOW (ref 3.87–5.11)
RDW: 13.6 % (ref 11.5–15.5)
WBC: 7.9 10*3/uL (ref 4.0–10.5)
nRBC: 0 % (ref 0.0–0.2)

## 2019-05-18 LAB — COMPREHENSIVE METABOLIC PANEL
ALT: 43 U/L (ref 0–44)
AST: 37 U/L (ref 15–41)
Albumin: 2.9 g/dL — ABNORMAL LOW (ref 3.5–5.0)
Alkaline Phosphatase: 56 U/L (ref 38–126)
Anion gap: 15 (ref 5–15)
BUN: 80 mg/dL — ABNORMAL HIGH (ref 8–23)
CO2: 16 mmol/L — ABNORMAL LOW (ref 22–32)
Calcium: 8.8 mg/dL — ABNORMAL LOW (ref 8.9–10.3)
Chloride: 104 mmol/L (ref 98–111)
Creatinine, Ser: 2.79 mg/dL — ABNORMAL HIGH (ref 0.44–1.00)
GFR calc Af Amer: 18 mL/min — ABNORMAL LOW (ref >60)
GFR calc non Af Amer: 15 mL/min — ABNORMAL LOW (ref >60)
Glucose, Bld: 270 mg/dL — ABNORMAL HIGH (ref 70–99)
Potassium: 4.8 mmol/L (ref 3.5–5.1)
Sodium: 135 mmol/L (ref 135–145)
Total Bilirubin: 0.2 mg/dL — ABNORMAL LOW (ref 0.3–1.2)
Total Protein: 5.4 g/dL — ABNORMAL LOW (ref 6.5–8.1)

## 2019-05-18 LAB — MAGNESIUM: Magnesium: 1.9 mg/dL (ref 1.7–2.4)

## 2019-05-18 LAB — C-REACTIVE PROTEIN: CRP: 1.9 mg/dL — ABNORMAL HIGH (ref <1.0)

## 2019-05-18 LAB — PHOSPHORUS: Phosphorus: 3.1 mg/dL (ref 2.5–4.6)

## 2019-05-18 LAB — D-DIMER, QUANTITATIVE: D-Dimer, Quant: 1.86 ug/mL-FEU — ABNORMAL HIGH (ref 0.00–0.50)

## 2019-05-18 MED ORDER — SODIUM CHLORIDE 0.9 % IV SOLN: INTRAVENOUS | Status: DC

## 2019-05-18 NOTE — Progress Notes (Signed)
RN called pt.'s daughter for daily updated, left a message.

## 2019-05-18 NOTE — Plan of Care (Signed)
  Problem: Education: Goal: Knowledge of risk factors and measures for prevention of condition will improve Outcome: Progressing   Problem: Coping: Goal: Psychosocial and spiritual needs will be supported Outcome: Progressing   Problem: Respiratory: Goal: Will maintain a patent airway Outcome: Progressing Goal: Complications related to the disease process, condition or treatment will be avoided or minimized Outcome: Progressing   

## 2019-05-18 NOTE — Progress Notes (Signed)
PROGRESS NOTE    Daisy Parker  [id number] DOB: 1940-12-30 DOA: 05/15/2019 PCP: Patient, No Pcp Per   Brief Narrative:  79 year old with history of frontotemporal dementia, HTN, hypothyroidism, CKD stage III, bradycardia and anemia presented from [facility] with complains of generalized profound weakness and hypoxia.  She was initially tested positive for COVID-19 on 1/20.  Upon admission she had slightly elevated inflammatory markers, chest x-ray was negative, had acute kidney injury.  Also diagnosed with urinary tract infection therefore started on IV Rocephin.  Assessment & Plan:   Principal Problem:   Pneumonia due to COVID-19 virus Active Problems:   Dementia ([place])   HTN (hypertension)   AKI (acute kidney injury) ([place])   COVID-19 virus infection   Acute lower UTI  Acute hypoxic respiratory distress secondary to COVID-19 pneumonia -Oxygen levels-room air and currently stable -Remdesivir-day 4 -Decadrone-day 4 -Routine: Labs have been reviewed including ferritin, LDH, CRP, d-dimer, fibrinogen.  Will need to trend this lab daily. -Vitamin C & Zinc. Prone >16hrs/day.  -procalcitonin-negative -Chest x-ray-no acute findings -Supportive care-antitussive, inhalers, I-S/flutter -CODE STATUS confirmed -BNP 156, no Lasix due to intravascular volume depletion  Acute urinary tract infection without hematuria -IV Rocephin D 3/5.  Gentle hydration.  Already received antibiotics prior to sending cultures  Acute kidney injury on CKD stage IIIb Acute metabolic acidosis -Baseline creatinine 2.0.  Admission creatinine 2.89.  Improved to 2.79 -Bladder scan showed 106 cc.  UA-UTI -Gentle hydration today 50 cc/h for 20 hours, total of 1 L -Continue sodium bicarb tabs  Essential hypertension -Norvasc and Coreg  Hypothyroidism -Synthroid  Anemia of chronic disease -Remains at baseline around 9.  Currently at baseline without any evidence of bleeding.  Dementia -On daily  olanzapine  DVT prophylaxis: Subcutaneous heparin Code Status: Full code Family Communication: Spoke with daughter yesterday.  No answer today, left her voicemail Disposition Plan:   Patient From= [facility]  Patient Anticipated D/C place= [facility]  Barriers= maintain hospital stay for IV hydration in the setting of acute kidney injury.  Eventually she will go back to [facility], as of now she set up for Monday.  Somewhat poor oral appetite therefore requiring IV fluids, slowly improving  Consultants:   None  Procedures:   None  Antimicrobials:   None   Subjective: Poor oral intake and pleasantly confused.  Review of Systems Otherwise negative except as per HPI, including: General = no fevers, chills, dizziness,  fatigue HEENT/EYES = negative for loss of vision, double vision, blurred vision,  sore throa Cardiovascular= negative for chest pain, palpitation Respiratory/lungs= negative for shortness of breath, cough, wheezing; hemoptysis,  Gastrointestinal= negative for nausea, vomiting, abdominal pain Genitourinary= negative for Dysuria MSK = Negative for arthralgia, myalgias Neurology= Negative for headache, numbness, tingling  Psychiatry= Negative for suicidal and homocidal ideation Skin= Negative for Rash    Examination: Constitutional: Elderly frail, pleasantly confused Respiratory: Clear to auscultation bilaterally Cardiovascular: Normal sinus rhythm, no rubs Abdomen: Nontender nondistended good bowel sounds Musculoskeletal: No edema noted Skin: No rashes seen Neurologic: CN 2-12 grossly intact.  And nonfocal Psychiatric: Alert to name only.  Poor judgment and insight.  External Foley catheter in place  Objective: Vitals:   05/17/19 1927 05/17/19 2145 05/18/19 0334 05/18/19 0800  BP: 140/71 138/70 140/74 (!) 141/80  Pulse: 62  75 74  Resp: 18  16 18   Temp: 98 F (36.7 C)  98.4 F (36.9 C) 98.2 F (36.8 C)  TempSrc: Oral  Oral  Axillary  SpO2: 97%  97% 97%  Weight:      Height:        Intake/Output Summary (Last 24 hours) at 05/18/2019 1035 Last data filed at 05/18/2019 0830 Gross per 24 hour  Intake 2061.31 ml  Output 1200 ml  Net 861.31 ml   Filed Weights   05/15/19 1027  Weight: 75 kg     Data Reviewed:   CBC: Recent Labs  Lab 05/15/19 1037 05/16/19 0400 05/17/19 0105 05/18/19 0310  WBC 10.0 4.9 8.2 7.9  NEUTROABS 8.0*  --   --   --   HGB 10.0* 9.3* 8.7* 8.6*  HCT 31.3* 28.7* 26.6* 26.5*  MCV 92.9 90.0 89.9 89.5  PLT 190 185 171 [id number]   Basic Metabolic Panel: Recent Labs  Lab 05/15/19 1037 05/16/19 0400 05/17/19 0105 05/18/19 0310  NA 139 138 137 135  K 4.2 4.6 4.8 4.8  CL 110 110 111 104  CO2 18* 15* 17* 16*  GLUCOSE 131* 191* 192* 270*  BUN 61* 66* 77* 80*  CREATININE 2.89* 2.80* 3.23* 2.79*  CALCIUM 9.5 9.3 9.1 8.8*  MG  --  2.0 2.0 1.9  PHOS  --  4.0 3.4 3.1   GFR: Estimated Creatinine Clearance: 16.9 mL/min (A) (by C-G formula based on SCr of 2.79 mg/dL (H)). Liver Function Tests: Recent Labs  Lab 05/15/19 1037 05/16/19 0400 05/17/19 0105 05/18/19 0310  AST 19 15 18  37  ALT 19 19 19  43  ALKPHOS 60 58 52 56  BILITOT 0.4 0.3 0.4 0.2*  PROT 6.5 6.1* 5.6* 5.4*  ALBUMIN 3.4* 3.1* 2.9* 2.9*   No results for input(s): LIPASE, AMYLASE in the last 168 hours. No results for input(s): AMMONIA in the last 168 hours. Coagulation Profile: No results for input(s): INR, PROTIME in the last 168 hours. Cardiac Enzymes: No results for input(s): CKTOTAL, CKMB, CKMBINDEX, TROPONINI in the last 168 hours. BNP (last 3 results) No results for input(s): PROBNP in the last 8760 hours. HbA1C: No results for input(s): HGBA1C in the last 72 hours. CBG: No results for input(s): GLUCAP in the last 168 hours. Lipid Profile: Recent Labs    05/15/19 1037  TRIG 180*   Thyroid Function Tests: No results for input(s): TSH, T4TOTAL, FREET4, T3FREE, THYROIDAB in the last 72 hours. Anemia  Panel: Recent Labs    05/15/19 1037  FERRITIN 255   Sepsis Labs: Recent Labs  Lab 05/15/19 1037 05/15/19 1237  PROCALCITON <0.10  --   LATICACIDVEN 1.4 0.9    Recent Results (from the past 240 hour(s))  Blood Culture (routine x 2)     Status: None (Preliminary result)   Collection Time: 05/15/19 11:00 AM   Specimen: BLOOD  Result Value Ref Range Status   Specimen Description   Final    BLOOD LEFT ANTECUBITAL Performed at [facility] [address] [place], [place] [address]    Special Requests   Final    BOTTLES DRAWN AEROBIC AND ANAEROBIC Blood Culture adequate volume Performed at [facility] [address] [place], [place] [address]    Culture   Final    NO GROWTH 3 DAYS Performed at [facility], [place] [address] [place],  [address]    Report Status PENDING  Incomplete  Blood Culture (routine x 2)     Status: None (Preliminary result)   Collection Time: 05/15/19 11:00 AM   Specimen: BLOOD  Result Value Ref Range Status   Specimen Description   Final  BLOOD RIGHT ANTECUBITAL Performed at [facility] [address] [place], [place] [address]    Special Requests   Final    BOTTLES DRAWN AEROBIC AND ANAEROBIC Blood Culture adequate volume Performed at [facility] [address], [place], [place] [address]    Culture   Final    NO GROWTH 3 DAYS Performed at [facility], [place] [address], [place],  [address]    Report Status PENDING  Incomplete         Radiology Studies: No results found.      Scheduled Meds: . amLODipine  10 mg Oral QHS  . vitamin C  500 mg Oral Daily  . carvedilol  6.25 mg Oral BID WC  . dexamethasone (DECADRON) injection  6 mg Intravenous Daily  . divalproex  125 mg Oral BID  . fluticasone  1 spray Each Nare Daily  . heparin  5,000 Units Subcutaneous Q8H  . Ipratropium-Albuterol  1 puff Inhalation Q6H  . levothyroxine   150 mcg Oral QAC breakfast  . nystatin cream   Topical BID  . OLANZapine  7.5 mg Oral QHS  . sodium bicarbonate  650 mg Oral BID  . zinc sulfate  220 mg Oral Daily   Continuous Infusions: . sodium chloride 50 mL/hr at 05/18/19 0803  . cefTRIAXone (ROCEPHIN)  IV 1 g (05/17/19 1727)  . remdesivir 100 mg in NS 100 mL 100 mg (05/18/19 0800)     LOS: 3 days   Time spent= 35 mins    Daisy Parker Arsenio Loader, MD [facility]  If 7PM-7AM, please contact night-coverage  05/18/2019, 10:35 AM

## 2019-05-19 LAB — COMPREHENSIVE METABOLIC PANEL
ALT: 62 U/L — ABNORMAL HIGH (ref 0–44)
AST: 43 U/L — ABNORMAL HIGH (ref 15–41)
Albumin: 3 g/dL — ABNORMAL LOW (ref 3.5–5.0)
Alkaline Phosphatase: 58 U/L (ref 38–126)
Anion gap: 9 (ref 5–15)
BUN: 84 mg/dL — ABNORMAL HIGH (ref 8–23)
CO2: 18 mmol/L — ABNORMAL LOW (ref 22–32)
Calcium: 9.5 mg/dL (ref 8.9–10.3)
Chloride: 111 mmol/L (ref 98–111)
Creatinine, Ser: 2.39 mg/dL — ABNORMAL HIGH (ref 0.44–1.00)
GFR calc Af Amer: 22 mL/min — ABNORMAL LOW (ref >60)
GFR calc non Af Amer: 19 mL/min — ABNORMAL LOW (ref >60)
Glucose, Bld: 269 mg/dL — ABNORMAL HIGH (ref 70–99)
Potassium: 4.8 mmol/L (ref 3.5–5.1)
Sodium: 138 mmol/L (ref 135–145)
Total Bilirubin: 0.4 mg/dL (ref 0.3–1.2)
Total Protein: 5.7 g/dL — ABNORMAL LOW (ref 6.5–8.1)

## 2019-05-19 LAB — PHOSPHORUS: Phosphorus: 3.3 mg/dL (ref 2.5–4.6)

## 2019-05-19 LAB — CBC
HCT: 29.5 % — ABNORMAL LOW (ref 36.0–46.0)
Hemoglobin: 9.6 g/dL — ABNORMAL LOW (ref 12.0–15.0)
MCH: 28.9 pg (ref 26.0–34.0)
MCHC: 32.5 g/dL (ref 30.0–36.0)
MCV: 88.9 fL (ref 80.0–100.0)
Platelets: 181 10*3/uL (ref 150–400)
RBC: 3.32 MIL/uL — ABNORMAL LOW (ref 3.87–5.11)
RDW: 13.6 % (ref 11.5–15.5)
WBC: 9.1 10*3/uL (ref 4.0–10.5)
nRBC: 0 % (ref 0.0–0.2)

## 2019-05-19 LAB — GLUCOSE, CAPILLARY
Glucose-Capillary: 281 mg/dL — ABNORMAL HIGH (ref 70–99)
Glucose-Capillary: 258 mg/dL — ABNORMAL HIGH (ref 70–99)
Glucose-Capillary: 335 mg/dL — ABNORMAL HIGH (ref 70–99)

## 2019-05-19 LAB — C-REACTIVE PROTEIN: CRP: 0.8 mg/dL (ref <1.0)

## 2019-05-19 LAB — HEMOGLOBIN A1C
Hgb A1c MFr Bld: 6.4 % — ABNORMAL HIGH (ref 4.8–5.6)
Mean Plasma Glucose: 136.98 mg/dL

## 2019-05-19 LAB — MAGNESIUM: Magnesium: 2 mg/dL (ref 1.7–2.4)

## 2019-05-19 LAB — D-DIMER, QUANTITATIVE: D-Dimer, Quant: 2.46 ug/mL-FEU — ABNORMAL HIGH (ref 0.00–0.50)

## 2019-05-19 MED ORDER — SODIUM CHLORIDE 0.9 % IV SOLN: INTRAVENOUS | Status: DC

## 2019-05-19 MED ORDER — INSULIN ASPART 100 UNIT/ML ~~LOC~~ SOLN
0.0000 [IU] | Freq: Every day | SUBCUTANEOUS | Status: DC
  Administered 2019-05-19: 4 [IU] via SUBCUTANEOUS

## 2019-05-19 MED ORDER — INSULIN ASPART 100 UNIT/ML ~~LOC~~ SOLN
0.0000 [IU] | Freq: Three times a day (TID) | SUBCUTANEOUS | Status: DC
  Administered 2019-05-19 – 2019-05-20 (×4): 8 [IU] via SUBCUTANEOUS
  Administered 2019-05-20 – 2019-05-21 (×2): 3 [IU] via SUBCUTANEOUS

## 2019-05-19 NOTE — Progress Notes (Signed)
Updated pt daughter and daughter talked to pt for a few minutes.

## 2019-05-19 NOTE — Progress Notes (Signed)
PROGRESS NOTE    Daisy Parker  [id number] DOB: 1940/05/26 DOA: 05/15/2019 PCP: Patient, No Pcp Per   Brief Narrative:  79 year old with history of frontotemporal dementia, HTN, hypothyroidism, CKD stage III, bradycardia and anemia presented from [facility] with complains of generalized profound weakness and hypoxia.  She was initially tested positive for COVID-19 on 1/20.  Upon admission she had slightly elevated inflammatory markers, chest x-ray was negative, had acute kidney injury.  Also diagnosed with urinary tract infection therefore started on IV Rocephin.  Assessment & Plan:   Principal Problem:   Pneumonia due to COVID-19 virus Active Problems:   Dementia ([place])   HTN (hypertension)   AKI (acute kidney injury) ([place])   COVID-19 virus infection   Acute lower UTI  Acute hypoxic respiratory distress secondary to COVID-19 pneumonia, improved -Oxygen levels-on room air, remained stable. -Remdesivir-day 5 -Decadrone-day 5 -Routine: Labs have been reviewed including ferritin, LDH, CRP, d-dimer, fibrinogen.  Will need to trend this lab daily. -Vitamin C & Zinc. Prone >16hrs/day.  -procalcitonin-negative -Chest x-ray-no acute findings -Supportive care-antitussive, inhalers, I-S/flutter -CODE STATUS confirmed -BNP 156, no Lasix due to intravascular volume depletion  Acute urinary tract infection without hematuria Mild dehydration, improving -IV Rocephin day 4/5.  Gentle hydration today.  No culture data as she was already started on antibiotics   Acute kidney injury on CKD stage IIIb Acute metabolic acidosis -Baseline creatinine 2.0.  Admission creatinine 2.89.  Improved to 2.39. -Bladder scan showed 106 cc.  UA-UTI -Gentle hydration today. -Continue sodium bicarb tabs  Essential hypertension -Norvasc and Coreg  Hypothyroidism -Synthroid  Anemia of chronic disease -Remains at baseline around 9.  Currently at baseline without any evidence of  bleeding.  Dementia -On daily olanzapine  DVT prophylaxis: Subcutaneous heparin Code Status: Full code Family Communication: None.  Patient remains stable. Disposition Plan:   Patient From= [facility]  Patient Anticipated D/C place= [facility]  Barriers= maintain hospital stay for IV fluids, poor oral intake and inconsistent.  Plans for her to go back to [facility] tomorrow.  Consultants:   None  Procedures:   None  Antimicrobials:   None   Subjective: Pleasantly confused, no complaints.  Overall poor appetite.  Review of Systems Otherwise negative except as per HPI, including: General = no fevers, chills, dizziness,  fatigue HEENT/EYES = negative for loss of vision, double vision, blurred vision,  sore throa Cardiovascular= negative for chest pain, palpitation Respiratory/lungs= negative for shortness of breath, cough, wheezing; hemoptysis,  Gastrointestinal= negative for nausea, vomiting, abdominal pain Genitourinary= negative for Dysuria MSK = Negative for arthralgia, myalgias Neurology= Negative for headache, numbness, tingling  Psychiatry= Negative for suicidal and homocidal ideation Skin= Negative for Rash  Examination: Constitutional: NAD, elderly weak frail. Respiratory: Clear to auscultation bilaterally Cardiovascular: Normal sinus rhythm, no rubs Abdomen: Nontender nondistended good bowel sounds Musculoskeletal: No edema noted Skin: No rashes seen Neurologic: CN 2-12 grossly intact.  And nonfocal Psychiatric: Poor judgment and insight.  Alert to her name only.  External Foley catheter in place  Objective: Vitals:   05/18/19 1933 05/18/19 2228 05/19/19 0600 05/19/19 0734  BP: (!) 142/60 (!) 144/68 (!) 160/78 (!) 157/83  Pulse: 77  77 72  Resp: 18  18 20   Temp: 98 F (36.7 C)  98.7 F (37.1 C) 97.7 F (36.5 C)  TempSrc: Oral  Oral Oral  SpO2: 97%  96% 97%  Weight:      Height:        Intake/Output Summary (Last 24  hours) at  05/19/2019 1039 Last data filed at 05/19/2019 0849 Gross per 24 hour  Intake 2263.32 ml  Output 800 ml  Net 1463.32 ml   Filed Weights   05/15/19 1027  Weight: 75 kg     Data Reviewed:   CBC: Recent Labs  Lab 05/15/19 1037 05/16/19 0400 05/17/19 0105 05/18/19 0310 05/19/19 0605  WBC 10.0 4.9 8.2 7.9 9.1  NEUTROABS 8.0*  --   --   --   --   HGB 10.0* 9.3* 8.7* 8.6* 9.6*  HCT 31.3* 28.7* 26.6* 26.5* 29.5*  MCV 92.9 90.0 89.9 89.5 88.9  PLT 190 185 171 174 0000000   Basic Metabolic Panel: Recent Labs  Lab 05/15/19 1037 05/16/19 0400 05/17/19 0105 05/18/19 0310 05/19/19 0605  NA 139 138 137 135 138  K 4.2 4.6 4.8 4.8 4.8  CL 110 110 111 104 111  CO2 18* 15* 17* 16* 18*  GLUCOSE 131* 191* 192* 270* 269*  BUN 61* 66* 77* 80* 84*  CREATININE 2.89* 2.80* 3.23* 2.79* 2.39*  CALCIUM 9.5 9.3 9.1 8.8* 9.5  MG  --  2.0 2.0 1.9 2.0  PHOS  --  4.0 3.4 3.1 3.3   GFR: Estimated Creatinine Clearance: 19.8 mL/min (A) (by C-G formula based on SCr of 2.39 mg/dL (H)). Liver Function Tests: Recent Labs  Lab 05/15/19 1037 05/16/19 0400 05/17/19 0105 05/18/19 0310 05/19/19 0605  AST 19 15 18  37 43*  ALT 19 19 19  43 62*  ALKPHOS 60 58 52 56 58  BILITOT 0.4 0.3 0.4 0.2* 0.4  PROT 6.5 6.1* 5.6* 5.4* 5.7*  ALBUMIN 3.4* 3.1* 2.9* 2.9* 3.0*   No results for input(s): LIPASE, AMYLASE in the last 168 hours. No results for input(s): AMMONIA in the last 168 hours. Coagulation Profile: No results for input(s): INR, PROTIME in the last 168 hours. Cardiac Enzymes: No results for input(s): CKTOTAL, CKMB, CKMBINDEX, TROPONINI in the last 168 hours. BNP (last 3 results) No results for input(s): PROBNP in the last 8760 hours. HbA1C: No results for input(s): HGBA1C in the last 72 hours. CBG: No results for input(s): GLUCAP in the last 168 hours. Lipid Profile: No results for input(s): CHOL, HDL, LDLCALC, TRIG, CHOLHDL, LDLDIRECT in the last 72 hours. Thyroid Function Tests: No results  for input(s): TSH, T4TOTAL, FREET4, T3FREE, THYROIDAB in the last 72 hours. Anemia Panel: No results for input(s): VITAMINB12, FOLATE, FERRITIN, TIBC, IRON, RETICCTPCT in the last 72 hours. Sepsis Labs: Recent Labs  Lab 05/15/19 1037 05/15/19 1237  PROCALCITON <0.10  --   LATICACIDVEN 1.4 0.9    Recent Results (from the past 240 hour(s))  Blood Culture (routine x 2)     Status: None (Preliminary result)   Collection Time: 05/15/19 11:00 AM   Specimen: BLOOD  Result Value Ref Range Status   Specimen Description   Final    BLOOD LEFT ANTECUBITAL Performed at [facility] [address], [place], [place] [address]    Special Requests   Final    BOTTLES DRAWN AEROBIC AND ANAEROBIC Blood Culture adequate volume Performed at [facility] [address] [place], [place] [address]    Culture   Final    NO GROWTH 4 DAYS Performed at [facility], [place] [address], [place], [place] [address]    Report Status PENDING  Incomplete  Blood Culture (routine x 2)     Status: None (Preliminary result)   Collection Time: 05/15/19 11:00 AM  Specimen: BLOOD  Result Value Ref Range Status   Specimen Description   Final    BLOOD RIGHT ANTECUBITAL Performed at [facility] [address], [place], [place] [address]    Special Requests   Final    BOTTLES DRAWN AEROBIC AND ANAEROBIC Blood Culture adequate volume Performed at [facility] [address], [place], [place] [address]    Culture   Final    NO GROWTH 4 DAYS Performed at [facility], [place] [address] [place], [place] [address]    Report Status PENDING  Incomplete         Radiology Studies: No results found.      Scheduled Meds: . amLODipine  10 mg Oral QHS  . vitamin C  500 mg Oral Daily  . carvedilol  6.25 mg Oral BID WC  . dexamethasone (DECADRON) injection  6 mg Intravenous Daily  . divalproex  125 mg Oral BID  .  fluticasone  1 spray Each Nare Daily  . heparin  5,000 Units Subcutaneous Q8H  . insulin aspart  0-15 Units Subcutaneous TID WC  . insulin aspart  0-5 Units Subcutaneous QHS  . Ipratropium-Albuterol  1 puff Inhalation Q6H  . levothyroxine  150 mcg Oral QAC breakfast  . nystatin cream   Topical BID  . OLANZapine  7.5 mg Oral QHS  . sodium bicarbonate  650 mg Oral BID  . zinc sulfate  220 mg Oral Daily   Continuous Infusions: . sodium chloride    . cefTRIAXone (ROCEPHIN)  IV Stopped (05/19/19 0230)  . remdesivir 100 mg in NS 100 mL Stopped (05/18/19 1930)     LOS: 4 days   Time spent= 20 mins    Joscelyn Hardrick Arsenio Loader, MD [facility]  If 7PM-7AM, please contact night-coverage  05/19/2019, 10:39 AM

## 2019-05-20 LAB — CBC
HCT: 29.1 % — ABNORMAL LOW (ref 36.0–46.0)
Hemoglobin: 9.6 g/dL — ABNORMAL LOW (ref 12.0–15.0)
MCH: 29.1 pg (ref 26.0–34.0)
MCHC: 33 g/dL (ref 30.0–36.0)
MCV: 88.2 fL (ref 80.0–100.0)
Platelets: 193 10*3/uL (ref 150–400)
RBC: 3.3 MIL/uL — ABNORMAL LOW (ref 3.87–5.11)
RDW: 13.8 % (ref 11.5–15.5)
WBC: 10 10*3/uL (ref 4.0–10.5)
nRBC: 0.2 % (ref 0.0–0.2)

## 2019-05-20 LAB — GLUCOSE, CAPILLARY
Glucose-Capillary: 198 mg/dL — ABNORMAL HIGH (ref 70–99)
Glucose-Capillary: 286 mg/dL — ABNORMAL HIGH (ref 70–99)
Glucose-Capillary: 257 mg/dL — ABNORMAL HIGH (ref 70–99)

## 2019-05-20 LAB — COMPREHENSIVE METABOLIC PANEL
ALT: 82 U/L — ABNORMAL HIGH (ref 0–44)
AST: 45 U/L — ABNORMAL HIGH (ref 15–41)
Albumin: 2.8 g/dL — ABNORMAL LOW (ref 3.5–5.0)
Alkaline Phosphatase: 60 U/L (ref 38–126)
Anion gap: 8 (ref 5–15)
BUN: 82 mg/dL — ABNORMAL HIGH (ref 8–23)
CO2: 17 mmol/L — ABNORMAL LOW (ref 22–32)
Calcium: 9.4 mg/dL (ref 8.9–10.3)
Chloride: 111 mmol/L (ref 98–111)
Creatinine, Ser: 2.36 mg/dL — ABNORMAL HIGH (ref 0.44–1.00)
GFR calc Af Amer: 22 mL/min — ABNORMAL LOW (ref >60)
GFR calc non Af Amer: 19 mL/min — ABNORMAL LOW (ref >60)
Glucose, Bld: 244 mg/dL — ABNORMAL HIGH (ref 70–99)
Potassium: 4.8 mmol/L (ref 3.5–5.1)
Sodium: 136 mmol/L (ref 135–145)
Total Bilirubin: 0.3 mg/dL (ref 0.3–1.2)
Total Protein: 5.4 g/dL — ABNORMAL LOW (ref 6.5–8.1)

## 2019-05-20 LAB — MAGNESIUM: Magnesium: 2 mg/dL (ref 1.7–2.4)

## 2019-05-20 LAB — PHOSPHORUS: Phosphorus: 3.2 mg/dL (ref 2.5–4.6)

## 2019-05-20 LAB — C-REACTIVE PROTEIN: CRP: 1 mg/dL — ABNORMAL HIGH (ref <1.0)

## 2019-05-20 LAB — D-DIMER, QUANTITATIVE: D-Dimer, Quant: 2.67 ug/mL-FEU — ABNORMAL HIGH (ref 0.00–0.50)

## 2019-05-20 MED ORDER — SODIUM BICARBONATE 650 MG PO TABS: 650.0000 mg | ORAL_TABLET | Freq: Two times a day (BID) | ORAL | 0 refills | Status: DC

## 2019-05-20 MED ORDER — DEXAMETHASONE 4 MG PO TABS: 4.0000 mg | ORAL_TABLET | Freq: Every day | ORAL | 0 refills | Status: AC

## 2019-05-20 MED ORDER — CEPHALEXIN 250 MG PO CAPS: 250.0000 mg | ORAL_CAPSULE | Freq: Three times a day (TID) | ORAL | 0 refills | Status: AC

## 2019-05-20 NOTE — Plan of Care (Signed)
  Problem: Education: Goal: Knowledge of risk factors and measures for prevention of condition will improve Outcome: Progressing   Problem: Coping: Goal: Psychosocial and spiritual needs will be supported Outcome: Progressing   Problem: Respiratory: Goal: Will maintain a patent airway Outcome: Progressing Goal: Complications related to the disease process, condition or treatment will be avoided or minimized Outcome: Progressing   

## 2019-05-20 NOTE — Care Management Important Message (Signed)
Important Message  Patient Details  Name: Daisy Parker MRN: [id number] Date of Birth: March 22, 1941   Medicare Important Message Given:  Yes - Important Message mailed due to current National Emergency  Verbal consent obtained due to current National Emergency  Relationship to patient: Child Contact Name: Keylani Ilyas Call Date: 05/20/19  Time: 1211 Phone: [phone] Outcome: Spoke with contact Important Message mailed to: Patient address on file    [place] 05/20/2019, 12:12 PM

## 2019-05-20 NOTE — Discharge Summary (Addendum)
Physician Discharge Summary  Daisy Parker [id number] DOB: 04/08/1941 DOA: 05/15/2019  PCP: Patient, No Pcp Per  Admit date: 05/15/2019 Discharge date: 05/21/19 Admitted From: Long-term care facility Disposition: Long-term care facility  Recommendations for Outpatient Follow-up:  1. Follow up with PCP in 1-2 weeks 2. Please obtain BMP/CBC in one week your next doctors visit.  3. Keflex prescribed for 5 days.  Sodium bicarb twice daily for 7 days 4. Dexamethasone 4 mg daily for 7 days   Discharge Condition: Stable CODE STATUS: Full code Diet recommendation: 2 g salt  Brief/Interim Summary: 79 year old with history of frontotemporal dementia, HTN, hypothyroidism, CKD stage III, bradycardia and anemia presented from [facility] with complains of generalized profound weakness and hypoxia.  She was initially tested positive for COVID-19 on 1/20.  Upon admission she had slightly elevated inflammatory markers, chest x-ray was negative, had acute kidney injury.  Also diagnosed with urinary tract infection therefore started on IV Rocephin.  Over the course of several days patient did much better, renal function started improving, she was gently hydrated.  On the day of discharge she was also given 5 more days of oral Keflex.  Today patient is stable to be discharged with outpatient follow-up recommendations as stated above.  Acute hypoxic respiratory distress secondary to COVID-19 pneumonia, improved -Greatly improved.  Completed 5 days of remdesivir.  We will discharge her on 7 more days of Decadron. -Advised to continue using bronchodilators as needed.  Continue to provide supportive care  Acute urinary tract infection without hematuria Mild dehydration, improving -No culture data, transition IV Rocephin to 5 more days of oral Keflex.  Encourage oral hydration.  Acute kidney injury on CKD stage IIIb Acute metabolic acidosis -Baseline creatinine 2.0.  Secondary to intravascular  depletion.  Creatinine improved to 2.36. -Sodium bicarb tabs twice daily for 7 more days.  Recommend outpatient follow-up with primary care physician in obtaining repeat lab work.  Essential hypertension -Norvasc and Coreg  Hypothyroidism -Synthroid  Anemia of chronic disease -Remains at baseline around 9.  Currently at baseline without any evidence of bleeding.  Dementia -On daily olanzapine   Discharge Diagnoses:  Principal Problem:   Pneumonia due to COVID-19 virus Active Problems:   Dementia ([place])   HTN (hypertension)   AKI (acute kidney injury) ([place])   COVID-19 virus infection   Acute lower UTI    Consultations:  None  Subjective: Pleasantly confused but awake alert, answers basic questions.  Denies any complaints  Discharge Exam: Vitals:   05/20/19 0340 05/20/19 0730  BP: 138/79 139/83  Pulse: 72 69  Resp: 18 18  Temp: 98.6 F (37 C) 98.1 F (36.7 C)  SpO2: 96% 96%   Vitals:   05/19/19 1703 05/19/19 2000 05/20/19 0340 05/20/19 0730  BP: 136/79 128/77 138/79 139/83  Pulse: 69 70 72 69  Resp: 18 16 18 18   Temp: 97.8 F (36.6 C) 98 F (36.7 C) 98.6 F (37 C) 98.1 F (36.7 C)  TempSrc: Oral Oral Oral Oral  SpO2: 98% 96% 96% 96%  Weight:      Height:        General: Pleasantly confused but not in acute distress Cardiovascular: RRR, S1/S2 +, no rubs, no gallops Respiratory: CTA bilaterally, no wheezing, no rhonchi Abdominal: Soft, NT, ND, bowel sounds + Extremities: no edema, no cyanosis  Discharge Instructions  Discharge Instructions    Call MD for:  difficulty breathing, headache or visual disturbances   Complete by: As directed    Diet -  low sodium heart healthy   Complete by: As directed    Discharge instructions   Complete by: As directed    You were cared for by a hospitalist during your hospital stay. If you have any questions about your discharge medications or the care you received while you were in the hospital after you are  discharged, you can call the unit and asked to speak with the hospitalist on call if the hospitalist that took care of you is not available. Once you are discharged, your primary care physician will handle any further medical issues. Please note that NO REFILLS for any discharge medications will be authorized once you are discharged, as it is imperative that you return to your primary care physician (or establish a relationship with a primary care physician if you do not have one) for your aftercare needs so that they can reassess your need for medications and monitor your lab values.  Please request your Prim.MD to go over all Hospital Tests and Procedure/Radiological results at the follow up, please get all Hospital records sent to your Prim MD by signing hospital release before you go home.  Get CBC, CMP, 2 view Chest X ray checked  by Primary MD during your next visit or SNF MD in 5-7 days ( we routinely change or add medications that can affect your baseline labs and fluid status, therefore we recommend that you get the mentioned basic workup next visit with your PCP, your PCP may decide not to get them or add new tests based on their clinical decision)  On your next visit with your primary care physician please Get Medicines reviewed and adjusted.  If you experience worsening of your admission symptoms, develop shortness of breath, life threatening emergency, suicidal or homicidal thoughts you must seek medical attention immediately by calling 911 or calling your MD immediately  if symptoms less severe.  You Must read complete instructions/literature along with all the possible adverse reactions/side effects for all the Medicines you take and that have been prescribed to you. Take any new Medicines after you have completely understood and accpet all the possible adverse reactions/side effects.   Do not drive, operate heavy machinery, perform activities at heights, swimming or participation in water  activities or provide baby sitting services if your were admitted for syncope or siezures until you have seen by Primary MD or a Neurologist and advised to do so again.  Do not drive when taking Pain medications.   Increase activity slowly   Complete by: As directed    MyChart COVID-19 home monitoring program   Complete by: May 20, 2019    Is the patient willing to use the [place] for home monitoring?: Yes   Temperature monitoring   Complete by: May 20, 2019    After how many days would you like to receive a notification of this patient's flowsheet entries?: 1     Allergies as of 05/20/2019      Reactions   Codeine Anaphylaxis   Sulfa Antibiotics Anaphylaxis      Medication List    TAKE these medications   acetaminophen 500 MG tablet Commonly known as: TYLENOL Take 1,000 mg by mouth 3 (three) times daily. 0800, 1400, 2000.   allopurinol 100 MG tablet Commonly known as: ZYLOPRIM Take 100 mg by mouth daily.   amLODipine 10 MG tablet Commonly known as: NORVASC Take 1 tablet (10 mg total) by mouth at bedtime.   brimonidine 0.2 % ophthalmic solution Commonly known  as: ALPHAGAN Place 1 drop into both eyes 2 (two) times daily.   carvedilol 6.25 MG tablet Commonly known as: COREG Take 1 tablet (6.25 mg total) by mouth 2 (two) times daily with a meal.   cephALEXin 250 MG capsule Commonly known as: KEFLEX Take 1 capsule (250 mg total) by mouth 3 (three) times daily for 5 days.   Daily Vite Multivitamin/Iron Tabs Take 1 tablet by mouth daily.   dexamethasone 4 MG tablet Commonly known as: DECADRON Take 1 tablet (4 mg total) by mouth daily for 7 days.   diclofenac Sodium 1 % Gel Commonly known as: VOLTAREN Apply 2 g topically 3 (three) times daily. Applied to knees, hips, and lower back.   divalproex 125 MG capsule Commonly known as: DEPAKOTE SPRINKLE Take 125 mg by mouth 2 (two) times daily.   fluticasone 50 MCG/ACT nasal spray Commonly known as:  FLONASE Place 1 spray into both nostrils daily.   latanoprost 0.005 % ophthalmic solution Commonly known as: XALATAN Place 1 drop into both eyes at bedtime.   levothyroxine 150 MCG tablet Commonly known as: SYNTHROID Take 150 mcg by mouth daily before breakfast.   OLANZapine 7.5 MG tablet Commonly known as: ZYPREXA Take 7.5 mg by mouth at bedtime.   polyethylene glycol 17 g packet Commonly known as: MIRALAX / GLYCOLAX Take 17 g by mouth daily.   sodium bicarbonate 650 MG tablet Take 1 tablet (650 mg total) by mouth 2 (two) times daily.   Vitamin D 50 MCG (2000 UT) tablet Take 2,000 Units by mouth daily.      Follow-up Information    Sueanne Margarita, MD. Schedule an appointment as soon as possible for a visit in 2 week(s).   Specialty: Cardiology Contact information: [id number] [place] [address] [phone]          Allergies  Allergen Reactions  . Codeine Anaphylaxis  . Sulfa Antibiotics Anaphylaxis    You were cared for by a hospitalist during your hospital stay. If you have any questions about your discharge medications or the care you received while you were in the hospital after you are discharged, you can call the unit and asked to speak with the hospitalist on call if the hospitalist that took care of you is not available. Once you are discharged, your primary care physician will handle any further medical issues. Please note that no refills for any discharge medications will be authorized once you are discharged, as it is imperative that you return to your primary care physician (or establish a relationship with a primary care physician if you do not have one) for your aftercare needs so that they can reassess your need for medications and monitor your lab values.   Procedures/Studies: DG Chest Portable 1 View  Result Date: 05/15/2019 CLINICAL DATA:  COVID-19 positive, hypoxia. EXAM: PORTABLE CHEST 1 VIEW COMPARISON:  March 12, 2019.  FINDINGS: Stable cardiomegaly. No pneumothorax or pleural effusion is noted. Left-sided pacemaker is unchanged in position. Both lungs are clear. The visualized skeletal structures are unremarkable. IMPRESSION: No active disease. Electronically Signed   By: Marijo Conception M.D.   On: 05/15/2019 11:20      The results of significant diagnostics from this hospitalization (including imaging, microbiology, ancillary and laboratory) are listed below for reference.     Microbiology: Recent Results (from the past 240 hour(s))  Blood Culture (routine x 2)     Status: None (Preliminary result)   Collection Time: 05/15/19  11:00 AM   Specimen: BLOOD  Result Value Ref Range Status   Specimen Description   Final    BLOOD LEFT ANTECUBITAL Performed at [facility] [address], [place], [place] [address]    Special Requests   Final    BOTTLES DRAWN AEROBIC AND ANAEROBIC Blood Culture adequate volume Performed at [facility] [address], [place], [place] [address]    Culture   Final    NO GROWTH 4 DAYS Performed at [facility], [place] [address], [place], [place] [address]    Report Status PENDING  Incomplete  Blood Culture (routine x 2)     Status: None (Preliminary result)   Collection Time: 05/15/19 11:00 AM   Specimen: BLOOD  Result Value Ref Range Status   Specimen Description   Final    BLOOD RIGHT ANTECUBITAL Performed at [facility] [address], [place], [place] [address]    Special Requests   Final    BOTTLES DRAWN AEROBIC AND ANAEROBIC Blood Culture adequate volume Performed at [facility] [address], [place], [place] [address]    Culture   Final    NO GROWTH 4 DAYS Performed at [facility], [place] [address] [place], [place] [address]    Report Status PENDING  Incomplete     Labs: BNP (last 3 results) Recent Labs    05/16/19 0400  BNP [id number]*   Basic Metabolic  Panel: Recent Labs  Lab 05/16/19 0400 05/17/19 0105 05/18/19 0310 05/19/19 0605 05/20/19 0540  NA 138 137 135 138 136  K 4.6 4.8 4.8 4.8 4.8  CL 110 111 104 111 111  CO2 15* 17* 16* 18* 17*  GLUCOSE 191* 192* 270* 269* 244*  BUN 66* 77* 80* 84* 82*  CREATININE 2.80* 3.23* 2.79* 2.39* 2.36*  CALCIUM 9.3 9.1 8.8* 9.5 9.4  MG 2.0 2.0 1.9 2.0 2.0  PHOS 4.0 3.4 3.1 3.3 3.2   Liver Function Tests: Recent Labs  Lab 05/16/19 0400 05/17/19 0105 05/18/19 0310 05/19/19 0605 05/20/19 0540  AST 15 18 37 43* 45*  ALT 19 19 43 62* 82*  ALKPHOS 58 52 56 58 60  BILITOT 0.3 0.4 0.2* 0.4 0.3  PROT 6.1* 5.6* 5.4* 5.7* 5.4*  ALBUMIN 3.1* 2.9* 2.9* 3.0* 2.8*   No results for input(s): LIPASE, AMYLASE in the last 168 hours. No results for input(s): AMMONIA in the last 168 hours. CBC: Recent Labs  Lab 05/15/19 1037 05/15/19 1037 05/16/19 0400 05/17/19 0105 05/18/19 0310 05/19/19 0605 05/20/19 0540  WBC 10.0   < > 4.9 8.2 7.9 9.1 10.0  NEUTROABS 8.0*  --   --   --   --   --   --   HGB 10.0*   < > 9.3* 8.7* 8.6* 9.6* 9.6*  HCT 31.3*   < > 28.7* 26.6* 26.5* 29.5* 29.1*  MCV 92.9   < > 90.0 89.9 89.5 88.9 88.2  PLT 190   < > 185 171 174 181 193   < > = values in this interval not displayed.   Cardiac Enzymes: No results for input(s): CKTOTAL, CKMB, CKMBINDEX, TROPONINI in the last 168 hours. BNP: Invalid input(s): POCBNP CBG: Recent Labs  Lab 05/19/19 1201 05/19/19 1705 05/19/19 2020 05/20/19 0754  GLUCAP 281* 258* 335* 198*   D-Dimer Recent Labs    05/19/19 0605 05/20/19 0540  DDIMER 2.46* 2.67*   Hgb A1c Recent Labs  05/19/19 0605  HGBA1C 6.4*   Lipid Profile No results for input(s): CHOL, HDL, LDLCALC, TRIG, CHOLHDL, LDLDIRECT in the last 72 hours. Thyroid function studies No results for input(s): TSH, T4TOTAL, T3FREE, THYROIDAB in the last 72 hours.  Invalid input(s): FREET3 Anemia work up No results for input(s): VITAMINB12, FOLATE, FERRITIN, TIBC,  IRON, RETICCTPCT in the last 72 hours. Urinalysis    Component Value Date/Time   COLORURINE YELLOW 05/16/2019 1133   APPEARANCEUR TURBID (A) 05/16/2019 1133   LABSPEC 1.019 05/16/2019 1133   PHURINE 5.0 05/16/2019 1133   GLUCOSEU NEGATIVE 05/16/2019 1133   HGBUR NEGATIVE 05/16/2019 1133   BILIRUBINUR NEGATIVE 05/16/2019 1133   KETONESUR NEGATIVE 05/16/2019 1133   PROTEINUR 100 (A) 05/16/2019 1133   NITRITE NEGATIVE 05/16/2019 1133   LEUKOCYTESUR LARGE (A) 05/16/2019 1133   Sepsis Labs Invalid input(s): PROCALCITONIN,  WBC,  LACTICIDVEN Microbiology Recent Results (from the past 240 hour(s))  Blood Culture (routine x 2)     Status: None (Preliminary result)   Collection Time: 05/15/19 11:00 AM   Specimen: BLOOD  Result Value Ref Range Status   Specimen Description   Final    BLOOD LEFT ANTECUBITAL Performed at [facility], [place] [address], [place], [place] [address]    Special Requests   Final    BOTTLES DRAWN AEROBIC AND ANAEROBIC Blood Culture adequate volume Performed at [facility] [address] [place], [place] [address]    Culture   Final    NO GROWTH 4 DAYS Performed at [facility], [place] [address], [place], [place] [address]    Report Status PENDING  Incomplete  Blood Culture (routine x 2)     Status: None (Preliminary result)   Collection Time: 05/15/19 11:00 AM   Specimen: BLOOD  Result Value Ref Range Status   Specimen Description   Final    BLOOD RIGHT ANTECUBITAL Performed at [facility] [address], [place], [place] [address]    Special Requests   Final    BOTTLES DRAWN AEROBIC AND ANAEROBIC Blood Culture adequate volume Performed at [facility] [address] [place], [place] [address]    Culture   Final    NO GROWTH 4 DAYS Performed at [facility], [place] [address] [place], [place] [address]    Report Status PENDING  Incomplete     Time  coordinating discharge:  I have spent 35 minutes face to face with the patient and on the ward discussing the patients care, assessment, plan and disposition with other care givers. >50% of the time was devoted counseling the patient about the risks and benefits of treatment/Discharge disposition and coordinating care.   SIGNED:   Damita Lack, MD  [facility] 05/20/2019, 11:06 AM   If 7PM-7AM, please contact night-coverage

## 2019-05-20 NOTE — NC FL2 (Signed)
[place] LEVEL OF CARE SCREENING TOOL     IDENTIFICATION  Patient Name: Daisy Parker Birthdate: Aug 20, 1940 Sex: female Admission Date (Current Location): 05/15/2019  [facility] and [place] Number:  Herbalist and Address:  [facility], [place] [address], [place], [place] [address]      Provider Number: [id number]  Attending Physician Name and Address:  Damita Lack, MD  Relative Name and Phone Number:       Current Level of Care: Hospital Recommended Level of Care: [place] Prior Approval Number:    Date Approved/Denied:   PASRR Number:    Discharge Plan: Other (Comment)(ALF)    Current Diagnoses: Patient Active Problem List   Diagnosis Date Noted  . Acute lower UTI 05/17/2019  . Pneumonia due to COVID-19 virus 05/15/2019  . COVID-19 virus infection 05/15/2019  . Urinary frequency 03/13/2019  . Palliative care by specialist   . DNR (do not resuscitate) discussion   . Bradycardia   . Mitral valve mass   . Second degree AV block, Mobitz type I   . Second degree AV block, Mobitz type II   . RBBB   . Syncope 03/08/2019  . AKI (acute kidney injury) ([place]) 05/16/2018  . Encephalopathy acute 05/14/2018  . Dementia ([place]) 05/14/2018  . HTN (hypertension) 05/14/2018  . CKD (chronic kidney disease) 05/14/2018    Orientation RESPIRATION BLADDER Height & Weight     Self, Situation, Place  Normal Incontinent Weight: 75 kg Height:  5\' 6"  (167.6 cm)  BEHAVIORAL SYMPTOMS/MOOD NEUROLOGICAL BOWEL NUTRITION STATUS      Continent Diet  AMBULATORY STATUS COMMUNICATION OF NEEDS Skin     Verbally Normal                       Personal Care Assistance Level of Assistance  Bathing, Feeding, Dressing Bathing Assistance: Limited assistance Feeding assistance: Independent Dressing Assistance: Limited assistance     Functional Limitations Info  Sight, Hearing, Speech Sight Info: Adequate Hearing Info:  Adequate Speech Info: Adequate    SPECIAL CARE FACTORS FREQUENCY  PT (By licensed PT)     PT Frequency: HHPT 2-3 times per week              Contractures Contractures Info: Not present    Additional Factors Info  Code Status, Allergies Code Status Info: Full Allergies Info: codeine, sulfa antibiotics           Current Medications (05/20/2019):    Discharge Medications: TAKE these medications   acetaminophen 500 MG tablet Commonly known as: TYLENOL Take 1,000 mg by mouth 3 (three) times daily. 0800, 1400, 2000.   allopurinol 100 MG tablet Commonly known as: ZYLOPRIM Take 100 mg by mouth daily.   amLODipine 10 MG tablet Commonly known as: NORVASC Take 1 tablet (10 mg total) by mouth at bedtime.   brimonidine 0.2 % ophthalmic solution Commonly known as: ALPHAGAN Place 1 drop into both eyes 2 (two) times daily.   carvedilol 6.25 MG tablet Commonly known as: COREG Take 1 tablet (6.25 mg total) by mouth 2 (two) times daily with a meal.   cephALEXin 250 MG capsule Commonly known as: KEFLEX Take 1 capsule (250 mg total) by mouth 3 (three) times daily for 5 days.   Daily Vite Multivitamin/Iron Tabs Take 1 tablet by mouth daily.   dexamethasone 4 MG tablet Commonly known as: DECADRON Take 1 tablet (4 mg total) by mouth daily for 7  days.   diclofenac Sodium 1 % Gel Commonly known as: VOLTAREN Apply 2 g topically 3 (three) times daily. Applied to knees, hips, and lower back.   divalproex 125 MG capsule Commonly known as: DEPAKOTE SPRINKLE Take 125 mg by mouth 2 (two) times daily.   fluticasone 50 MCG/ACT nasal spray Commonly known as: FLONASE Place 1 spray into both nostrils daily.   latanoprost 0.005 % ophthalmic solution Commonly known as: XALATAN Place 1 drop into both eyes at bedtime.   levothyroxine 150 MCG tablet Commonly known as: SYNTHROID Take 150 mcg by mouth daily before breakfast.   OLANZapine 7.5 MG tablet Commonly known  as: ZYPREXA Take 7.5 mg by mouth at bedtime.   polyethylene glycol 17 g packet Commonly known as: MIRALAX / GLYCOLAX Take 17 g by mouth daily.   sodium bicarbonate 650 MG tablet Take 1 tablet (650 mg total) by mouth 2 (two) times daily.   Vitamin D 50 MCG (2000 UT) tablet Take 2,000 Units by mouth daily.        Relevant Imaging Results:  Relevant Lab Results:   Additional Information Home Health RN and PT resumption with [facility], [name], RN

## 2019-05-20 NOTE — TOC Progression Note (Signed)
Transition of Care [facility]) - Progression Note    Patient Details  Name: [name] MRN: [id number] Date of Birth: 01/13/1941  [facility]) CM/SW Contact  Joaquin Courts, RN Phone Number: 05/20/2019, 4:35 PM  Clinical Narrative:    CM has faxed all requested paperwork to facility at 58 am.  Facility reports they have not had the chance to review patient's discharge paperwork as they are short staffed and their admissions case manager is in the middle of medication pass and has had other admissions she has been working on.  CM has followed up repeatedly.  Per facility admissions director patient can return tomorrow 2/9 after their admissions case manager has had the chance to review dc paperwork.  MD was made aware of this delay.     Expected Discharge Plan: Assisted Living Barriers to Discharge: No Barriers Identified  Expected Discharge Plan and Services Expected Discharge Plan: Assisted Living In-house Referral: Clinical Social Work   Post Acute Care Choice: Resumption of Svcs/PTA Provider Living arrangements for the past 2 months: [place] Expected Discharge Date: 05/20/19                           [facility] Agency: [facility]         Social Determinants of Health (SDOH) Interventions    Readmission Risk Interventions No flowsheet data found.

## 2019-05-21 LAB — GLUCOSE, CAPILLARY
Glucose-Capillary: 188 mg/dL — ABNORMAL HIGH (ref 70–99)
Glucose-Capillary: 162 mg/dL — ABNORMAL HIGH (ref 70–99)

## 2019-05-21 NOTE — Progress Notes (Signed)
Patient was discharged yesterday but due to admitting staff issue at her facility she was unable to be discharged as they were not able to offer her in her bed.  This morning she remained stable.  Vital signs are stable, no complaints.  No changes since discharge summary yesterday.  Stable to be transferred back with recommendations as stated.  Gerlean Ren MD

## 2019-05-21 NOTE — NC FL2 (Signed)
[place] LEVEL OF CARE SCREENING TOOL     IDENTIFICATION  Patient Name: Daisy Parker Birthdate: 12/27/1940 Sex: female Admission Date (Current Location): 05/15/2019  [facility] [place] Number:  Herbalist and Address:  [facility] [place] [address], [place], [place] [address]      Provider Number: [id number]  Attending Physician Name and Address:  Damita Lack, MD  Relative Name and Phone Number:       Current Level of Care: Hospital Recommended Level of Care: [place], Memory Care Prior Approval Number:    Date Approved/Denied:   PASRR Number:    Discharge Plan: Other (Comment)(ALF)    Current Diagnoses: Patient Active Problem List   Diagnosis Date Noted  . Acute lower UTI 05/17/2019  . Pneumonia due to COVID-19 virus 05/15/2019  . COVID-19 virus infection 05/15/2019  . Urinary frequency 03/13/2019  . Palliative care by specialist   . DNR (do not resuscitate) discussion   . Bradycardia   . Mitral valve mass   . Second degree AV block, Mobitz type I   . Second degree AV block, Mobitz type II   . RBBB   . Syncope 03/08/2019  . AKI (acute kidney injury) ([place]) 05/16/2018  . Encephalopathy acute 05/14/2018  . Dementia ([place]) 05/14/2018  . HTN (hypertension) 05/14/2018  . CKD (chronic kidney disease) 05/14/2018    Orientation RESPIRATION BLADDER Height & Weight     Self, Situation, Place  Normal Incontinent Weight: 75 kg Height:  5\' 6"  (167.6 cm)  BEHAVIORAL SYMPTOMS/MOOD NEUROLOGICAL BOWEL NUTRITION STATUS      Continent Diet(no added salt)  AMBULATORY STATUS COMMUNICATION OF NEEDS Skin     Verbally Normal                       Personal Care Assistance Level of Assistance  Bathing, Feeding, Dressing Bathing Assistance: Limited assistance Feeding assistance: Independent Dressing Assistance: Limited assistance     Functional Limitations Info  Sight, Hearing, Speech Sight Info:  Adequate Hearing Info: Adequate Speech Info: Adequate    SPECIAL CARE FACTORS FREQUENCY  PT (By licensed PT)     PT Frequency: HHPT 2-3 times per week              Contractures Contractures Info: Not present    Additional Factors Info  Code Status, Allergies Code Status Info: Full Allergies Info: codeine, sulfa antibiotics           Current Medications (05/21/2019):  This is the current hospital active medication list Current Facility-Administered Medications  Medication Dose Route Frequency Provider Last Rate Last Admin  . acetaminophen (TYLENOL) tablet 650 mg  650 mg Oral Q6H PRN Nicole Kindred A, DO   650 mg at 05/18/19 0901  . amLODipine (NORVASC) tablet 10 mg  10 mg Oral QHS Amin, Ankit Chirag, MD   10 mg at 05/20/19 2046  . ascorbic acid (VITAMIN C) tablet 500 mg  500 mg Oral Daily Nicole Kindred A, DO   500 mg at 05/21/19 [id number]  . bisacodyl (DULCOLAX) EC tablet 5 mg  5 mg Oral Daily PRN Nicole Kindred A, DO      . carvedilol (COREG) tablet 6.25 mg  6.25 mg Oral BID WC Amin, Ankit Chirag, MD   6.25 mg at 05/21/19 0841  . dexamethasone (DECADRON) injection 6 mg  6 mg Intravenous Daily Nicole Kindred A, DO   6 mg at 05/21/19 0842  . divalproex (  DEPAKOTE SPRINKLE) capsule 125 mg  125 mg Oral BID Damita Lack, MD   125 mg at 05/21/19 0842  . fluticasone (FLONASE) 50 MCG/ACT nasal spray 1 spray  1 spray Each Nare Daily Amin, Ankit Chirag, MD   1 spray at 05/21/19 0840  . guaiFENesin-dextromethorphan (ROBITUSSIN DM) 100-10 MG/5ML syrup 10 mL  10 mL Oral Q4H PRN Nicole Kindred A, DO      . heparin injection 5,000 Units  5,000 Units Subcutaneous Q8H Nicole Kindred A, DO   5,000 Units at 05/21/19 0538  . insulin aspart (novoLOG) injection 0-15 Units  0-15 Units Subcutaneous TID WC Amin, Jeanella Flattery, MD   3 Units at 05/21/19 0840  . insulin aspart (novoLOG) injection 0-5 Units  0-5 Units Subcutaneous QHS Amin, Ankit Chirag, MD   4 Units at 05/19/19 2300  .  Ipratropium-Albuterol (COMBIVENT) respimat 1 puff  1 puff Inhalation Q6H Griffith, Kelly A, DO   1 puff at 05/21/19 [id number]  . levothyroxine (SYNTHROID) tablet 150 mcg  150 mcg Oral QAC breakfast Damita Lack, MD   150 mcg at 05/21/19 0538  . magnesium citrate solution 1 Bottle  1 Bottle Oral Once PRN Nicole Kindred A, DO      . nystatin cream (MYCOSTATIN)   Topical BID Caren Griffins, MD   Given at 05/21/19 [phone]  . OLANZapine (ZYPREXA) tablet 7.5 mg  7.5 mg Oral QHS Peyton Bottoms, MD   7.5 mg at 05/20/19 2046  . ondansetron (ZOFRAN) tablet 4 mg  4 mg Oral Q6H PRN Nicole Kindred A, DO       Or  . ondansetron (ZOFRAN) injection 4 mg  4 mg Intravenous Q6H PRN Nicole Kindred A, DO      . senna-docusate (Senokot-S) tablet 1 tablet  1 tablet Oral QHS PRN Nicole Kindred A, DO      . sodium bicarbonate tablet 650 mg  650 mg Oral BID Amin, Ankit Chirag, MD   650 mg at 05/21/19 0841  . zinc sulfate capsule 220 mg  220 mg Oral Daily Nicole Kindred A, DO   220 mg at 05/21/19 [id number]     Discharge Medications: Please see discharge summary for a list of discharge medications.  Relevant Imaging Results:  Relevant Lab Results:   Additional Information Home Health RN and PT resumption with [facility], [name], RN

## 2019-05-21 NOTE — Progress Notes (Addendum)
Patient discharging today back to [facility] at [phone]. This nurse called and gave report to [facility] LPN. Discharge instructions, and AVS were in the packet given to PTAR. This nurse notified pt's daughter [name] that pt was returning to [facility] today. IV access removed.

## 2019-05-21 NOTE — Plan of Care (Signed)
No acute events during this shift. Pt remains on RA. Awaiting SNF placement.     Problem: Education: Goal: Knowledge of risk factors and measures for prevention of condition will improve Outcome: Progressing   Problem: Coping: Goal: Psychosocial and spiritual needs will be supported Outcome: Progressing   Problem: Respiratory: Goal: Will maintain a patent airway Outcome: Progressing Goal: Complications related to the disease process, condition or treatment will be avoided or minimized Outcome: Progressing

## 2019-05-21 NOTE — TOC Transition Note (Signed)
Transition of Care [facility]) - CM/SW Discharge Note   Patient Details  Name: Kaycia Garmon MRN: [id number] Date of Birth: Mar 02, 1941  Transition of Care (TOC) CM/SW Contact:  Joaquin Courts, RN Phone Number: 05/21/2019, 10:30 AM   Clinical Narrative:    CM received confirmation that [facility] today. PTAR transportation arranged.    Final next level of care: Assisted Living Barriers to Discharge: No Barriers Identified   Patient Goals and CMS Choice Patient states their goals for this hospitalization and ongoing recovery are:: to return to [facility] Medicare.gov Compare Post Acute Care list provided to:: Patient Represenative (must comment) Choice offered to / list presented to : Adult Children  Discharge Placement                       Discharge Plan and Services In-house Referral: Clinical Social Work   Post Acute Care Choice: Resumption of Svcs/PTA Provider                      [place] Agency: [facility]        Social Determinants of Health ([place]) Interventions     Readmission Risk Interventions No flowsheet data found.

## 2019-05-22 ENCOUNTER — Telehealth: Payer: Self-pay

## 2019-05-22 NOTE — Telephone Encounter (Signed)
Pt daughter updated on situation.  She is agreeable with pt attending AF clinic.  Pt will be restricted from leaving the facility for at least a week.  This will need to be coordinated with Tammy or Freada Bergeron at [facility] [phone].  Pt daughter also requested that once appt is scheduled, please notify her of when the appt will be and whether she can attend with the pt.

## 2019-05-22 NOTE — Telephone Encounter (Signed)
Patient Daisy Parker need AF clinic appointment when she can leave the SNF to discuss anticoagulation.

## 2019-05-22 NOTE — Telephone Encounter (Signed)
PPM alert transmission received this morning it was from 2/9 1423 (presumably when pt admitted back to SNF)- new onset/ ongoing AF since 05/18/18, ventricular rate is controlled.  Pt does not have documented history of AF and not on [place].  Pt discharged yesterday to SNF following hospitalization for COVID related Pneumonia.  Pt does have a history of Dementia.  Attempting to reach SNF to have manual transmission sent today to determine if AF is still ongoing/ discuss if pt having any symptoms and VS.

## 2019-05-22 NOTE — Telephone Encounter (Signed)
Attempted to reach pt daughter to advise of MD recommendation for AF clinic.

## 2019-05-22 NOTE — Telephone Encounter (Signed)
Called and spoke with daughter, Makyah Shelman, and discussed pt having virtual visit with one of our APPs.  Daughter is agreeable.  Called and spoke with Tammy at facility about doing virtual visit with pt.  She states that DeeDee is off today.  Tammy will discuss with DeeDee on 05/23/2019 when she returns to work and have [place] call the A-Fib clinic to discuss and set up visit.

## 2019-05-22 NOTE — Telephone Encounter (Signed)
Received call back from [facility], Care Manager who was attending to pt.  Informed her of what we are seeing on PM.  She states that pt is at baseline for symptoms, she is not seeing any increased SOB.  Pt VS are stable.  Crystal will notify facility physicians.

## 2019-05-22 NOTE — Telephone Encounter (Signed)
Spoke with [place], at [facility], explained situation.  Lilyan Punt stated she was not in the same area as the pt and would have someone call me back to discuss pt condition.

## 2019-05-24 LAB — CULTURE, BLOOD (ROUTINE X 2)
Culture: NO GROWTH
Culture: NO GROWTH
Special Requests: ADEQUATE
Special Requests: ADEQUATE

## 2019-05-24 NOTE — Telephone Encounter (Signed)
The pt daughter states she has not heard about her mom appointment with the A-fib clinic. I told her per [facility] phone note that she spoke with Tammy at the facility about the virtual visit. Tammy stated she was going to talk with DeeDee and DeeDee was going to call the A-fib clinic to discuss and set up the visit. The daughter asked for the number to the A-fib clinic and I gave it to her. She thanked me for my help.

## 2019-05-27 NOTE — Telephone Encounter (Signed)
Per call from pt's daughter, [name], she spoke with SNF NP today.  The NP advised pt's daughter that pt is not in A-Fib and she recommends pt see Dr. Curt Bears as scheduled on 06/13/2019.  I advised daughter that if pt see Dr. Curt Bears and he feels she still needs to see [place], he will let us know and get pt appt here.  Daughter agreeable with NP at SNF recommendations and nothing further needed.

## 2019-05-29 ENCOUNTER — Other Ambulatory Visit: Payer: Self-pay

## 2019-05-29 ENCOUNTER — Ambulatory Visit (HOSPITAL_COMMUNITY)
Admission: RE | Admit: 2019-05-29 | Discharge: 2019-05-29 | Disposition: A | Discharge status: Home / Self Care | Payer: Medicare Other | Source: Ambulatory Visit | Attending: Physician Assistant | Admitting: Physician Assistant

## 2019-05-29 DIAGNOSIS — I4819 Other persistent atrial fibrillation: Secondary | ICD-10-CM

## 2019-05-29 MED ORDER — APIXABAN 5 MG PO TABS: 5.0000 mg | ORAL_TABLET | Freq: Two times a day (BID) | ORAL | 0 refills | Status: DC

## 2019-05-29 NOTE — Telephone Encounter (Signed)
Confirmed with Rushie Goltz, RN, that R. Marlene Lard, PA, discussed plan with Nicki Reaper, NP. Plan to start pt on Eliquis.  Spoke with [name] to update on plan. Next f/u with Dr. Curt Bears on 06/13/19 on 2:00pm. [name] verbalizes understanding. All questions answered.

## 2019-05-29 NOTE — Telephone Encounter (Signed)
LMOVM for Tammy, Resident Care Manager at [facility], requesting call back to DC. Direct number provided. Spoke with [name], pt's daughter. She requests that I contact Nicki Reaper, NP, with [facility] at [phone] to share PPM transmission results and determine plan. She requests that if pt needs virtual AF Clinic appointment, she would like to be present virtually, unable to do Mondays, preferably scheduled early morning or between 2-3pm. Kallie Edward that I will reach out to [place], NP, and call her back after plan in place.  LM with receptionist at [facility] requesting call back from [place]. Direct DC phone number provided.

## 2019-05-29 NOTE — Progress Notes (Signed)
Spoke to Daisy Kinder NP regarding new onset afib for Daisy Parker. Given her CHADS2VASC score of 4, recommended starting Eliquis 5 mg BID. In July, she will be 79 y/o and meet Daisy criteria for 2.5 mg BID. Marlowe Kays to call in Rx. Patient completely asymptomatic with her afib. Given age and dementia would favor a conservative approach. F/u with Dr Curt Bears as scheduled.

## 2019-06-11 ENCOUNTER — Ambulatory Visit (INDEPENDENT_AMBULATORY_CARE_PROVIDER_SITE_OTHER): Payer: Medicare Other | Admitting: *Deleted

## 2019-06-11 DIAGNOSIS — I441 Atrioventricular block, second degree: Secondary | ICD-10-CM

## 2019-06-11 LAB — CUP PACEART REMOTE DEVICE CHECK
Battery Remaining Longevity: 85 mo
Battery Remaining Percentage: 95.5 %
Battery Voltage: 3.02 V
Brady Statistic AP VP Percent: 50 %
Brady Statistic AP VS Percent: 1 %
Brady Statistic AS VP Percent: 50 %
Brady Statistic AS VS Percent: 1 %
Brady Statistic RA Percent Paced: 36 %
Brady Statistic RV Percent Paced: 99 %
Date Time Interrogation Session: 20210302020013
Implantable Lead Implant Date: 20201130
Implantable Lead Implant Date: 20201130
Implantable Lead Location: 753859
Implantable Lead Location: 753860
Implantable Pulse Generator Implant Date: 20201130
Lead Channel Impedance Value: 360 Ohm
Lead Channel Impedance Value: 610 Ohm
Lead Channel Pacing Threshold Amplitude: 0.375 V
Lead Channel Pacing Threshold Amplitude: 0.5 V
Lead Channel Pacing Threshold Pulse Width: 0.5 ms
Lead Channel Pacing Threshold Pulse Width: 0.5 ms
Lead Channel Sensing Intrinsic Amplitude: 5 mV
Lead Channel Sensing Intrinsic Amplitude: 6.7 mV
Lead Channel Setting Pacing Amplitude: 0.625
Lead Channel Setting Pacing Amplitude: 3.5 V
Lead Channel Setting Pacing Pulse Width: 0.5 ms
Lead Channel Setting Sensing Sensitivity: 2 mV
Pulse Gen Model: 2272
Pulse Gen Serial Number: 9182735

## 2019-06-12 NOTE — Progress Notes (Signed)
PPM Remote  

## 2019-06-13 ENCOUNTER — Encounter (HOSPITAL_COMMUNITY): Payer: Self-pay

## 2019-06-13 ENCOUNTER — Encounter: Payer: Self-pay | Admitting: Cardiology

## 2019-06-13 ENCOUNTER — Inpatient Hospital Stay (HOSPITAL_COMMUNITY)
Admission: EM | Admit: 2019-06-13 | Discharge: 2019-06-18 | DRG: 391 | Disposition: A | Discharge status: Skilled Nursing Facility | Payer: Medicare Other | Source: Skilled Nursing Facility | Attending: Internal Medicine | Admitting: Internal Medicine

## 2019-06-13 ENCOUNTER — Ambulatory Visit (INDEPENDENT_AMBULATORY_CARE_PROVIDER_SITE_OTHER): Payer: Medicare Other | Admitting: Cardiology

## 2019-06-13 ENCOUNTER — Other Ambulatory Visit: Payer: Self-pay

## 2019-06-13 VITALS — BP 106/56 | HR 70 | Ht 66.0 in

## 2019-06-13 DIAGNOSIS — D62 Acute posthemorrhagic anemia: Secondary | ICD-10-CM | POA: Diagnosis present

## 2019-06-13 DIAGNOSIS — M1990 Unspecified osteoarthritis, unspecified site: Secondary | ICD-10-CM | POA: Diagnosis present

## 2019-06-13 DIAGNOSIS — K449 Diaphragmatic hernia without obstruction or gangrene: Secondary | ICD-10-CM | POA: Diagnosis present

## 2019-06-13 DIAGNOSIS — E039 Hypothyroidism, unspecified: Secondary | ICD-10-CM | POA: Diagnosis present

## 2019-06-13 DIAGNOSIS — F0390 Unspecified dementia without behavioral disturbance: Secondary | ICD-10-CM | POA: Diagnosis present

## 2019-06-13 DIAGNOSIS — Z882 Allergy status to sulfonamides status: Secondary | ICD-10-CM | POA: Diagnosis not present

## 2019-06-13 DIAGNOSIS — Z66 Do not resuscitate: Secondary | ICD-10-CM | POA: Diagnosis present

## 2019-06-13 DIAGNOSIS — Z7902 Long term (current) use of antithrombotics/antiplatelets: Secondary | ICD-10-CM | POA: Diagnosis not present

## 2019-06-13 DIAGNOSIS — Z515 Encounter for palliative care: Secondary | ICD-10-CM | POA: Diagnosis present

## 2019-06-13 DIAGNOSIS — I10 Essential (primary) hypertension: Secondary | ICD-10-CM | POA: Diagnosis not present

## 2019-06-13 DIAGNOSIS — I441 Atrioventricular block, second degree: Secondary | ICD-10-CM

## 2019-06-13 DIAGNOSIS — E785 Hyperlipidemia, unspecified: Secondary | ICD-10-CM | POA: Diagnosis present

## 2019-06-13 DIAGNOSIS — F0391 Unspecified dementia with behavioral disturbance: Secondary | ICD-10-CM | POA: Diagnosis not present

## 2019-06-13 DIAGNOSIS — U071 COVID-19: Secondary | ICD-10-CM | POA: Diagnosis present

## 2019-06-13 DIAGNOSIS — Z79899 Other long term (current) drug therapy: Secondary | ICD-10-CM

## 2019-06-13 DIAGNOSIS — I4891 Unspecified atrial fibrillation: Secondary | ICD-10-CM | POA: Diagnosis present

## 2019-06-13 DIAGNOSIS — D649 Anemia, unspecified: Secondary | ICD-10-CM

## 2019-06-13 DIAGNOSIS — K2980 Duodenitis without bleeding: Principal | ICD-10-CM | POA: Diagnosis present

## 2019-06-13 DIAGNOSIS — K5710 Diverticulosis of small intestine without perforation or abscess without bleeding: Secondary | ICD-10-CM | POA: Diagnosis present

## 2019-06-13 DIAGNOSIS — K269 Duodenal ulcer, unspecified as acute or chronic, without hemorrhage or perforation: Secondary | ICD-10-CM | POA: Diagnosis not present

## 2019-06-13 DIAGNOSIS — Z885 Allergy status to narcotic agent status: Secondary | ICD-10-CM | POA: Diagnosis not present

## 2019-06-13 DIAGNOSIS — Z950 Presence of cardiac pacemaker: Secondary | ICD-10-CM

## 2019-06-13 DIAGNOSIS — N184 Chronic kidney disease, stage 4 (severe): Secondary | ICD-10-CM | POA: Diagnosis present

## 2019-06-13 DIAGNOSIS — K222 Esophageal obstruction: Secondary | ICD-10-CM | POA: Diagnosis not present

## 2019-06-13 DIAGNOSIS — K922 Gastrointestinal hemorrhage, unspecified: Secondary | ICD-10-CM

## 2019-06-13 DIAGNOSIS — G8929 Other chronic pain: Secondary | ICD-10-CM | POA: Diagnosis present

## 2019-06-13 DIAGNOSIS — I129 Hypertensive chronic kidney disease with stage 1 through stage 4 chronic kidney disease, or unspecified chronic kidney disease: Secondary | ICD-10-CM | POA: Diagnosis present

## 2019-06-13 DIAGNOSIS — K2981 Duodenitis with bleeding: Secondary | ICD-10-CM

## 2019-06-13 DIAGNOSIS — R195 Other fecal abnormalities: Secondary | ICD-10-CM

## 2019-06-13 DIAGNOSIS — Z7901 Long term (current) use of anticoagulants: Secondary | ICD-10-CM | POA: Diagnosis not present

## 2019-06-13 DIAGNOSIS — Z79890 Hormone replacement therapy (postmenopausal): Secondary | ICD-10-CM | POA: Diagnosis not present

## 2019-06-13 LAB — CBC WITH DIFFERENTIAL/PLATELET
Abs Immature Granulocytes: 0.27 10*3/uL — ABNORMAL HIGH (ref 0.00–0.07)
Basophils Absolute: 0 10*3/uL (ref 0.0–0.1)
Basophils Relative: 0 %
Eosinophils Absolute: 0.1 10*3/uL (ref 0.0–0.5)
Eosinophils Relative: 1 %
HCT: 21.5 % — ABNORMAL LOW (ref 36.0–46.0)
Hemoglobin: 6.8 g/dL — CL (ref 12.0–15.0)
Immature Granulocytes: 6 %
Lymphocytes Relative: 15 %
Lymphs Abs: 0.7 10*3/uL (ref 0.7–4.0)
MCH: 30.5 pg (ref 26.0–34.0)
MCHC: 31.6 g/dL (ref 30.0–36.0)
MCV: 96.4 fL (ref 80.0–100.0)
Monocytes Absolute: 0.6 10*3/uL (ref 0.1–1.0)
Monocytes Relative: 12 %
Neutro Abs: 3.2 10*3/uL (ref 1.7–7.7)
Neutrophils Relative %: 66 %
Platelets: 200 10*3/uL (ref 150–400)
RBC: 2.23 MIL/uL — ABNORMAL LOW (ref 3.87–5.11)
RDW: 17.2 % — ABNORMAL HIGH (ref 11.5–15.5)
WBC: 4.8 10*3/uL (ref 4.0–10.5)
nRBC: 1.2 % — ABNORMAL HIGH (ref 0.0–0.2)

## 2019-06-13 LAB — CUP PACEART INCLINIC DEVICE CHECK
Date Time Interrogation Session: 20210304170001
Implantable Lead Implant Date: 20201130
Implantable Lead Implant Date: 20201130
Implantable Lead Location: 753859
Implantable Lead Location: 753860
Implantable Pulse Generator Implant Date: 20201130
Pulse Gen Model: 2272
Pulse Gen Serial Number: 9182735

## 2019-06-13 LAB — COMPREHENSIVE METABOLIC PANEL
ALT: 31 U/L (ref 0–44)
AST: 21 U/L (ref 15–41)
Albumin: 2.3 g/dL — ABNORMAL LOW (ref 3.5–5.0)
Alkaline Phosphatase: 78 U/L (ref 38–126)
Anion gap: 9 (ref 5–15)
BUN: 74 mg/dL — ABNORMAL HIGH (ref 8–23)
CO2: 22 mmol/L (ref 22–32)
Calcium: 9 mg/dL (ref 8.9–10.3)
Chloride: 103 mmol/L (ref 98–111)
Creatinine, Ser: 2.38 mg/dL — ABNORMAL HIGH (ref 0.44–1.00)
GFR calc Af Amer: 22 mL/min — ABNORMAL LOW (ref >60)
GFR calc non Af Amer: 19 mL/min — ABNORMAL LOW (ref >60)
Glucose, Bld: 205 mg/dL — ABNORMAL HIGH (ref 70–99)
Potassium: 5.1 mmol/L (ref 3.5–5.1)
Sodium: 134 mmol/L — ABNORMAL LOW (ref 135–145)
Total Bilirubin: 0.5 mg/dL (ref 0.3–1.2)
Total Protein: 5.5 g/dL — ABNORMAL LOW (ref 6.5–8.1)

## 2019-06-13 LAB — POC OCCULT BLOOD, ED: Fecal Occult Bld: POSITIVE — AB

## 2019-06-13 LAB — RETICULOCYTES
Immature Retic Fract: 37.1 % — ABNORMAL HIGH (ref 2.3–15.9)
RBC.: 2.22 MIL/uL — ABNORMAL LOW (ref 3.87–5.11)
Retic Count, Absolute: 77.5 10*3/uL (ref 19.0–186.0)
Retic Ct Pct: 3.5 % — ABNORMAL HIGH (ref 0.4–3.1)

## 2019-06-13 LAB — PROTIME-INR
INR: 1.4 — ABNORMAL HIGH (ref 0.8–1.2)
Prothrombin Time: 16.9 seconds — ABNORMAL HIGH (ref 11.4–15.2)

## 2019-06-13 MED ORDER — POLYETHYLENE GLYCOL 3350 17 G PO PACK
17.0000 g | PACK | Freq: Every day | ORAL | Status: DC
  Administered 2019-06-18: 17 g via ORAL
  Filled 2019-06-13 (×4): qty 1

## 2019-06-13 MED ORDER — DICLOFENAC SODIUM 1 % EX GEL
2.0000 g | Freq: Three times a day (TID) | CUTANEOUS | Status: DC
  Administered 2019-06-14 – 2019-06-18 (×9): 2 g via TOPICAL
  Filled 2019-06-13: qty 100

## 2019-06-13 MED ORDER — DIVALPROEX SODIUM 125 MG PO CSDR
125.0000 mg | DELAYED_RELEASE_CAPSULE | Freq: Two times a day (BID) | ORAL | Status: DC
  Administered 2019-06-13 – 2019-06-18 (×10): 125 mg via ORAL
  Filled 2019-06-13 (×11): qty 1

## 2019-06-13 MED ORDER — ACETAMINOPHEN 650 MG RE SUPP: 650.0000 mg | Freq: Four times a day (QID) | RECTAL | Status: DC | PRN

## 2019-06-13 MED ORDER — OLANZAPINE 5 MG PO TABS
7.5000 mg | ORAL_TABLET | Freq: Every day | ORAL | Status: DC
  Administered 2019-06-13 – 2019-06-17 (×5): 7.5 mg via ORAL
  Filled 2019-06-13 (×5): qty 2
  Filled 2019-06-13: qty 1

## 2019-06-13 MED ORDER — LEVOTHYROXINE SODIUM 75 MCG PO TABS
150.0000 ug | ORAL_TABLET | Freq: Every day | ORAL | Status: DC
  Administered 2019-06-14 – 2019-06-18 (×4): 150 ug via ORAL
  Filled 2019-06-13: qty 1
  Filled 2019-06-13: qty 6
  Filled 2019-06-13 (×2): qty 2
  Filled 2019-06-13: qty 1

## 2019-06-13 MED ORDER — SODIUM BICARBONATE 650 MG PO TABS
650.0000 mg | ORAL_TABLET | Freq: Two times a day (BID) | ORAL | Status: DC
  Administered 2019-06-14 – 2019-06-18 (×10): 650 mg via ORAL
  Filled 2019-06-13 (×11): qty 1

## 2019-06-13 MED ORDER — SODIUM CHLORIDE 0.9 % IV SOLN
10.0000 mL/h | Freq: Once | INTRAVENOUS | Status: AC
  Administered 2019-06-13: 10 mL/h via INTRAVENOUS

## 2019-06-13 MED ORDER — SODIUM CHLORIDE 0.9 % IV SOLN
80.0000 mg | Freq: Once | INTRAVENOUS | Status: AC
  Administered 2019-06-13: 80 mg via INTRAVENOUS
  Filled 2019-06-13: qty 80

## 2019-06-13 MED ORDER — SERTRALINE HCL 25 MG PO TABS
25.0000 mg | ORAL_TABLET | Freq: Every day | ORAL | Status: DC
  Administered 2019-06-14 – 2019-06-18 (×5): 25 mg via ORAL
  Filled 2019-06-13 (×5): qty 1

## 2019-06-13 MED ORDER — ALLOPURINOL 100 MG PO TABS
100.0000 mg | ORAL_TABLET | Freq: Every day | ORAL | Status: DC
  Administered 2019-06-14 – 2019-06-18 (×5): 100 mg via ORAL
  Filled 2019-06-13 (×5): qty 1

## 2019-06-13 MED ORDER — VITAMIN D3 25 MCG (1000 UNIT) PO TABS
2000.0000 [IU] | ORAL_TABLET | Freq: Every day | ORAL | Status: DC
  Administered 2019-06-15 – 2019-06-18 (×4): 2000 [IU] via ORAL
  Filled 2019-06-13 (×5): qty 2

## 2019-06-13 MED ORDER — AMLODIPINE BESYLATE 10 MG PO TABS
10.0000 mg | ORAL_TABLET | Freq: Every day | ORAL | Status: DC
  Administered 2019-06-14 – 2019-06-17 (×5): 10 mg via ORAL
  Filled 2019-06-13 (×5): qty 1

## 2019-06-13 MED ORDER — ACETAMINOPHEN 500 MG PO TABS
1000.0000 mg | ORAL_TABLET | Freq: Three times a day (TID) | ORAL | Status: DC
  Administered 2019-06-14 – 2019-06-18 (×13): 1000 mg via ORAL
  Filled 2019-06-13 (×13): qty 2

## 2019-06-13 MED ORDER — BRIMONIDINE TARTRATE 0.2 % OP SOLN
1.0000 [drp] | Freq: Two times a day (BID) | OPHTHALMIC | Status: DC
  Administered 2019-06-14 – 2019-06-18 (×10): 1 [drp] via OPHTHALMIC
  Filled 2019-06-13: qty 5

## 2019-06-13 MED ORDER — SODIUM CHLORIDE 0.9 % IV SOLN
80.0000 mg | Freq: Two times a day (BID) | INTRAVENOUS | Status: DC
  Administered 2019-06-14 (×2): 80 mg via INTRAVENOUS
  Filled 2019-06-13 (×5): qty 80

## 2019-06-13 MED ORDER — CARVEDILOL 6.25 MG PO TABS
6.2500 mg | ORAL_TABLET | Freq: Two times a day (BID) | ORAL | Status: DC
  Administered 2019-06-14 – 2019-06-18 (×8): 6.25 mg via ORAL
  Filled 2019-06-13 (×8): qty 1

## 2019-06-13 MED ORDER — ONDANSETRON HCL 4 MG/2ML IJ SOLN: 4.0000 mg | Freq: Four times a day (QID) | INTRAMUSCULAR | Status: DC | PRN

## 2019-06-13 MED ORDER — LATANOPROST 0.005 % OP SOLN
1.0000 [drp] | Freq: Every day | OPHTHALMIC | Status: DC
  Administered 2019-06-14 – 2019-06-17 (×6): 1 [drp] via OPHTHALMIC
  Filled 2019-06-13: qty 2.5

## 2019-06-13 MED ORDER — FLUTICASONE PROPIONATE 50 MCG/ACT NA SUSP
1.0000 | Freq: Every day | NASAL | Status: DC
  Administered 2019-06-15 – 2019-06-18 (×4): 1 via NASAL
  Filled 2019-06-13: qty 16

## 2019-06-13 MED ORDER — TAB-A-VITE/IRON PO TABS
1.0000 | ORAL_TABLET | Freq: Every day | ORAL | Status: DC
  Administered 2019-06-15 – 2019-06-18 (×4): 1 via ORAL
  Filled 2019-06-13 (×5): qty 1

## 2019-06-13 MED ORDER — ACETAMINOPHEN 325 MG PO TABS: 650.0000 mg | ORAL_TABLET | Freq: Four times a day (QID) | ORAL | Status: DC | PRN

## 2019-06-13 MED ORDER — ONDANSETRON HCL 4 MG PO TABS: 4.0000 mg | ORAL_TABLET | Freq: Four times a day (QID) | ORAL | Status: DC | PRN

## 2019-06-13 NOTE — ED Triage Notes (Signed)
Pt brought from SNF with hgb of 6.6. No other complaints. Dementia, poor historian

## 2019-06-13 NOTE — ED Notes (Signed)
Pt lying in bed, eye's closed, chest rising and falling. Awakens easily. Denies any s/s of transfusion reaction. Will continue to monitor.

## 2019-06-13 NOTE — ED Notes (Signed)
Pt lying in bed. Daughter at bedside. Blood transfusion started. Will continue to monitor.

## 2019-06-13 NOTE — ED Notes (Signed)
Pt lying in bed. Full monitor on. Daughter at bedside. Will continue to monitor. Apple sauce given.

## 2019-06-13 NOTE — ED Provider Notes (Signed)
[place] DEPT Provider Note   CSN: [id number] Arrival date & time: 06/13/19  1702     History Chief Complaint  Patient presents with  . Anemia    Daisy Parker is a 79 y.o. female.  HPI    Level 5 caveat for altered mental status due to dementia.  79 year old with history of hypertension, hyperlipidemia, severe dementia, valvular disorder is likely on apixaban comes in a chief complaint of low hemoglobin.  Patient resides at a skilled nursing facility.  They reported patient's hemoglobin was 6.6.  Patient denies any associated symptoms and has not seen any bloody stools.   I spoke with patient's daughter.  She reports that patient has never had GI bleed before or required blood transfusion before.  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  Daughter denies any history of scopes before.  Past Medical History:  Diagnosis Date  . Acute lower UTI 05/17/2019  . AKI (acute kidney injury) ([place]) 05/16/2018  . Anemia   . Bradycardia   . Cataract   . CKD (chronic kidney disease) 05/14/2018  . CKD (chronic kidney disease), stage IV ([place])   . COVID-19 virus infection 05/15/2019  . Dementia ([place])   . DNR (do not resuscitate) discussion   . 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 Encephalopathy acute 05/14/2018  . HTN (hypertension) 05/14/2018  . Hypertension   . Hypothyroidism   . Mitral valve mass   . Palliative care by specialist   . Pneumonia due to COVID-19 virus 05/15/2019  . RBBB   . Second degree AV block, Mobitz type I   . Second degree AV block, Mobitz type II   . Syncope   . Urinary frequency 03/13/2019    Patient Active Problem List   Diagnosis Date Noted  . Acute blood loss anemia 06/13/2019  . Acute lower UTI 05/17/2019  . Pneumonia due to COVID-19 virus 05/15/2019  . COVID-19 virus infection 05/15/2019  . Urinary frequency 03/13/2019  . Palliative care by specialist   . DNR (do not resuscitate)  discussion   . Bradycardia   . Mitral valve mass   . Second degree AV block, Mobitz type I   . Second degree AV block, Mobitz type II   . RBBB   . Syncope 03/08/2019  . AKI (acute kidney injury) ([place]) 05/16/2018  . Encephalopathy acute 05/14/2018  . Dementia ([place]) 05/14/2018  . HTN (hypertension) 05/14/2018  . 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/min (HCC) 05/14/2018    Past Surgical History:  Procedure Laterality Date  . PACEMAKER IMPLANT N/A 03/11/2019   Procedure: PACEMAKER IMPLANT;  Surgeon: [name], MD;  Location: [place] [facility];  Service: Cardiovascular;  Laterality: N/A;     OB History   No obstetric history on file.     Family History  Family history unknown: Yes    Social History   Tobacco Use  . Smoking status: Former Research scientist (life sciences)  . Smokeless tobacco: Never Used  Substance Use Topics  . Alcohol use: Never  . Drug use: Never    Home Medications Prior to Admission medications   Medication Sig Start Date End Date Taking? Authorizing Provider  acetaminophen (TYLENOL) 500 MG tablet Take 1,000 mg by mouth 3 (three) times daily. 0800, 1400, 2000.   Yes [provider]  allopurinol (ZYLOPRIM) 100 MG tablet Take 100 mg by mouth daily.   Yes [provider]  amLODipine (NORVASC) 10 MG tablet  Take 1 tablet (10 mg total) by mouth at bedtime. 03/13/19  Yes Marianna Payment, MD  apixaban (ELIQUIS) 5 MG TABS tablet Take 1 tablet (5 mg total) by mouth 2 (two) times daily. 05/29/19  Yes Fenton, Clint R, PA  brimonidine (ALPHAGAN) 0.2 % ophthalmic solution Place 1 drop into both eyes 2 (two) times daily.   Yes [provider]  carvedilol (COREG) 6.25 MG tablet Take 1 tablet (6.25 mg total) by mouth 2 (two) times daily with a meal. 03/13/19  Yes Marianna Payment, MD  Cholecalciferol (VITAMIN D) 50 MCG (2000 UT) tablet Take 2,000 Units by mouth daily.   Yes [provider]  diclofenac Sodium (VOLTAREN) 1 % GEL Apply 2 g  topically 3 (three) times daily. Applied to knees, hips, and lower back. 03/13/19  Yes [provider]  divalproex (DEPAKOTE SPRINKLE) 125 MG capsule Take 125 mg by mouth 2 (two) times daily.   Yes [provider]  fluticasone (FLONASE) 50 MCG/ACT nasal spray Place 1 spray into both nostrils daily.   Yes [provider]  latanoprost (XALATAN) 0.005 % ophthalmic solution Place 1 drop into both eyes at bedtime.   Yes [provider]  levothyroxine (SYNTHROID) 150 MCG tablet Take 150 mcg by mouth daily before breakfast.   Yes [provider]  Multiple Vitamins-Iron (DAILY VITE MULTIVITAMIN/IRON) TABS Take 1 tablet by mouth daily.   Yes [provider]  OLANZapine (ZYPREXA) 7.5 MG tablet Take 7.5 mg by mouth at bedtime.   Yes [provider]  polyethylene glycol (MIRALAX / GLYCOLAX) packet Take 17 g by mouth daily. 05/17/18  Yes Dorrell, Andree Elk, MD  sertraline (ZOLOFT) 25 MG tablet Take 25 mg by mouth daily.  05/27/19  Yes [provider]  sodium bicarbonate 650 MG tablet Take 1 tablet (650 mg total) by mouth 2 (two) times daily. 05/20/19  Yes Amin, Jeanella Flattery, MD    Allergies    Codeine and Sulfa antibiotics  Review of Systems   Review of Systems  Unable to perform ROS: Mental status change    Physical Exam Updated Vital Signs BP 130/66   Pulse 70   Temp 97.7 F (36.5 C) (Oral)   Resp 13   SpO2 97%   Physical Exam Vitals and nursing note reviewed.  Constitutional:      Appearance: She is well-developed.  HENT:     Head: Normocephalic and atraumatic.  Cardiovascular:     Rate and Rhythm: Normal rate.  Pulmonary:     Effort: Pulmonary effort is normal.  Abdominal:     General: Bowel sounds are normal.     Tenderness: There is no abdominal tenderness.     Comments: Dark brown stool that is not melanotic.  No BRBPR.  Stools are Hemoccult positive  Musculoskeletal:     Cervical back: Normal range of motion and  neck supple.  Skin:    General: Skin is warm and dry.  Neurological:     Mental Status: She is alert.     ED Results / Procedures / Treatments   Labs (all labs ordered are listed, but only abnormal results are displayed) Labs Reviewed  COMPREHENSIVE METABOLIC PANEL - Abnormal; Notable for the following components:      Result Value   Sodium 134 (*)    Glucose, Bld 205 (*)    BUN 74 (*)    Creatinine, Ser 2.38 (*)    Total Protein 5.5 (*)    Albumin 2.3 (*)  GFR calc non Af Amer 19 (*)    GFR calc Af Amer 22 (*)    All other components within normal limits  CBC WITH DIFFERENTIAL/PLATELET - Abnormal; Notable for the following components:   RBC 2.23 (*)    Hemoglobin 6.8 (*)    HCT 21.5 (*)    RDW 17.2 (*)    nRBC 1.2 (*)    Abs Immature Granulocytes 0.27 (*)    All other components within normal limits  PROTIME-INR - Abnormal; Notable for the following components:   Prothrombin Time 16.9 (*)    INR 1.4 (*)    All other components within normal limits  RETICULOCYTES - Abnormal; Notable for the following components:   Retic Ct Pct 3.5 (*)    RBC. 2.22 (*)    Immature Retic Fract 37.1 (*)    All other components within normal limits  POC OCCULT BLOOD, ED - Abnormal; Notable for the following components:   Fecal Occult Bld POSITIVE (*)    All other components within normal limits  VITAMIN B12  FOLATE  IRON AND TIBC  FERRITIN  CBC  BASIC METABOLIC PANEL  TYPE AND SCREEN  PREPARE RBC (CROSSMATCH)    EKG None  Radiology CUP PACEART INCLINIC DEVICE CHECK  Result Date: 06/13/2019 Device checked in clinic by industry. See scanned report. Next home remote 09/10/19. ROV w/ WC in 9 months.Lavenia Atlas, BSN, RN   Procedures .Critical Care Performed by: Varney Biles, MD Authorized by: Varney Biles, MD   Critical care provider statement:    Critical care time (minutes):  36   Critical care was necessary to treat or prevent imminent or life-threatening  deterioration of the following conditions: Severe anemia.   Critical care was time spent personally by me on the following activities:  Discussions with consultants, evaluation of patient's response to treatment, examination of patient, ordering and performing treatments and interventions, ordering and review of laboratory studies, ordering and review of radiographic studies, pulse oximetry, re-evaluation of patient's condition, obtaining history from patient or surrogate and review of old charts   (including critical care time)  Medications Ordered in ED Medications  pantoprazole (PROTONIX) 80 mg in sodium chloride 0.9 % 100 mL IVPB (has no administration in time range)  0.9 %  sodium chloride infusion (has no administration in time range)  acetaminophen (TYLENOL) tablet 650 mg (has no administration in time range)    Or  acetaminophen (TYLENOL) suppository 650 mg (has no administration in time range)  ondansetron (ZOFRAN) tablet 4 mg (has no administration in time range)    Or  ondansetron (ZOFRAN) injection 4 mg (has no administration in time range)  pantoprazole (PROTONIX) 80 mg in sodium chloride 0.9 % 100 mL IVPB (has no administration in time range)    ED Course  I have reviewed the triage vital signs and the nursing notes.  Pertinent labs & imaging results that were available during my care of the patient were reviewed by me and considered in my medical decision making (see chart for details).  Clinical Course as of Jun 13 2111  Meredeth Ide Jun 13, 2019  2112 Daughter at the bedside.  She has consented for transfusion.  We will order 2 units.  Hemoglobin(!!): 6.8 [AN]  2112 Not new.  Creatinine(!): 2.38 [AN]  2112 Spoke with GI.  Dr. Rush Landmark recommends IV Protonix twice daily and n.p.o. after midnight.  GI team will assess the patient for scope tomorrow.  Fecal Occult Blood, POC(!): POSITIVE [AN]  Clinical Course User Index [AN] Varney Biles, MD   MDM  Rules/Calculators/A&P                      79 year old comes in a chief complaint of low hemoglobin.  She has severe dementia not providing any meaningful history.  Patient is on Eliquis.  Currently hemodynamically stable.  We will proceed with lab work-up and also complete Hemoccult exam. We will update the family once we have some more results.  Final Clinical Impression(s) / ED Diagnoses Final diagnoses:  Symptomatic anemia    Rx / DC Orders ED Discharge Orders    None       Varney Biles, MD 06/13/19 2113

## 2019-06-13 NOTE — Patient Instructions (Signed)
Medication Instructions:  Your physician recommends that you continue on your current medications as directed. Please refer to the Current Medication list given to you today.  *If you need a refill on your cardiac medications before your next appointment, please call your pharmacy*   Lab Work: None ordered   Testing/Procedures: None ordered   Follow-Up: Remote monitoring is used to monitor your Pacemaker of ICD from home. This monitoring reduces the number of office visits required to check your device to one time per year. It allows [place] to keep an eye on the functioning of your device to ensure it is working properly. You are scheduled for a device check from home on 09/10/19. You may send your transmission at any time that day. If you have a wireless device, the transmission will be sent automatically. After your physician reviews your transmission, you will receive a postcard with your next transmission date.   At [facility], you and your health needs are our priority.  As part of our continuing mission to provide you with exceptional heart care, we have created designated Provider Care Teams.  These Care Teams include your primary Cardiologist (physician) and Advanced Practice Providers (APPs -  Physician Assistants and Nurse Practitioners) who all work together to provide you with the care you need, when you need it.  We recommend signing up for the patient portal called "MyChart".  Sign up information is provided on this After Visit Summary.  MyChart is used to connect with patients for Virtual Visits (Telemedicine).  Patients are able to view lab/test results, encounter notes, upcoming appointments, etc.  Non-urgent messages can be sent to your provider as well.   To learn more about what you can do with MyChart, go to [email or web address].    Your next appointment:   9 month(s)  The format for your next appointment:   In Person  Provider:   Allegra Lai, MD   Thank you  for choosing [facility]!!   Trinidad Curet, RN [phone]

## 2019-06-13 NOTE — ED Notes (Addendum)
ED TO INPATIENT HANDOFF REPORT  ED Nurse Name and Phone #: Fredonia Highland [phone]  S Name/Age/Gender Daisy Parker 79 y.o. female Room/Bed: WA12/WA12  Code Status   Code Status: Full Code  Home/SNF/Other Skilled nursing facility Patient oriented to: self Is this baseline? No   Triage Complete: Triage complete  Chief Complaint Acute blood loss anemia [D62]  Triage Note Pt brought from SNF with hgb of 6.6. No other complaints. Dementia, poor historian     Allergies Allergies  Allergen Reactions  . Codeine Anaphylaxis  . Sulfa Antibiotics Anaphylaxis    Level of Care/Admitting Diagnosis ED Disposition    ED Disposition Condition Comment   Admit  Hospital Area: [facility] [[id number]]  Level of Care: Telemetry [5]  Admit to tele based on following criteria: Other see comments  Comments: GIB  May [facility] patient to [facility] or [facility] if equivalent level of care is available:: Yes  Covid Evaluation: Asymptomatic Screening Protocol (No Symptoms)  Diagnosis: Acute blood loss anemia [id number]  Admitting Physician: Doreatha Massed  Attending Physician: Etta Quill [phone]  Estimated length of stay: past midnight tomorrow  Certification:: I certify this patient will need inpatient services for at least 2 midnights       B Medical/Surgery History Past Medical History:  Diagnosis Date  . Acute lower UTI 05/17/2019  . AKI (acute kidney injury) ([place]) 05/16/2018  . Anemia   . Bradycardia   . Cataract   . CKD (chronic kidney disease) 05/14/2018  . CKD (chronic kidney disease), stage IV ([place])   . COVID-19 virus infection 05/15/2019  . Dementia ([place])   . DNR (do not resuscitate) discussion   . Duodenal perforation ([place])   . Encephalopathy acute 05/14/2018  . HTN (hypertension) 05/14/2018  . Hypertension   . Hypothyroidism   . Mitral valve mass   . Palliative care by specialist   . Pneumonia due to COVID-19 virus 05/15/2019  . RBBB   . Second  degree AV block, Mobitz type I   . Second degree AV block, Mobitz type II   . Syncope   . Urinary frequency 03/13/2019   Past Surgical History:  Procedure Laterality Date  . PACEMAKER IMPLANT N/A 03/11/2019   Procedure: PACEMAKER IMPLANT;  Surgeon: [name], MD;  Location: [place] [facility];  Service: Cardiovascular;  Laterality: N/A;     A IV Location/Drains/Wounds Patient Lines/Drains/Airways Status   Active Line/Drains/Airways    Name:   Placement date:   Placement time:   Site:   Days:   Peripheral IV 06/13/19 Right Antecubital   06/13/19    1815    Antecubital   less than 1   Peripheral IV 06/13/19 Left Antecubital   06/13/19    2021    Antecubital   less than 1          Intake/Output Last 24 hours  Intake/Output Summary (Last 24 hours) at 06/13/2019 2258 Last data filed at 06/13/2019 2137 Gross per 24 hour  Intake 100 ml  Output --  Net 100 ml    Labs/Imaging Results for orders placed or performed during the hospital encounter of 06/13/19 (from the past 48 hour(s))  Comprehensive metabolic panel     Status: Abnormal   Collection Time: 06/13/19  6:15 PM  Result Value Ref Range   Sodium 134 (L) 135 - 145 mmol/L   Potassium 5.1 3.5 - 5.1 mmol/L   Chloride 103 98 - 111 mmol/L   CO2  22 22 - 32 mmol/L   Glucose, Bld 205 (H) 70 - 99 mg/dL    Comment: Glucose reference range applies only to samples taken after fasting for at least 8 hours.   BUN 74 (H) 8 - 23 mg/dL   Creatinine, Ser 2.38 (H) 0.44 - 1.00 mg/dL   Calcium 9.0 8.9 - 10.3 mg/dL   Total Protein 5.5 (L) 6.5 - 8.1 g/dL   Albumin 2.3 (L) 3.5 - 5.0 g/dL   AST 21 15 - 41 U/L   ALT 31 0 - 44 U/L   Alkaline Phosphatase 78 38 - 126 U/L   Total Bilirubin 0.5 0.3 - 1.2 mg/dL   GFR calc non Af Amer 19 (L) >60 mL/min   GFR calc Af Amer 22 (L) >60 mL/min   Anion gap 9 5 - 15    Comment: Performed at [facility], [place] [address] [place], [place] [address]  CBC with Differential      Status: Abnormal   Collection Time: 06/13/19  6:15 PM  Result Value Ref Range   WBC 4.8 4.0 - 10.5 K/uL   RBC 2.23 (L) 3.87 - 5.11 MIL/uL   Hemoglobin 6.8 (LL) 12.0 - 15.0 g/dL    Comment: REPEATED TO VERIFY THIS CRITICAL RESULT HAS VERIFIED AND BEEN CALLED TO RN M MACIVER BY ALEXIS Greenfield ON 03 04 2021 AT 1937, AND HAS BEEN READ BACK.     HCT 21.5 (L) 36.0 - 46.0 %   MCV 96.4 80.0 - 100.0 fL   MCH 30.5 26.0 - 34.0 pg   MCHC 31.6 30.0 - 36.0 g/dL   RDW 17.2 (H) 11.5 - 15.5 %   Platelets 200 150 - 400 K/uL   nRBC 1.2 (H) 0.0 - 0.2 %   Neutrophils Relative % 66 %   Neutro Abs 3.2 1.7 - 7.7 K/uL   Lymphocytes Relative 15 %   Lymphs Abs 0.7 0.7 - 4.0 K/uL   Monocytes Relative 12 %   Monocytes Absolute 0.6 0.1 - 1.0 K/uL   Eosinophils Relative 1 %   Eosinophils Absolute 0.1 0.0 - 0.5 K/uL   Basophils Relative 0 %   Basophils Absolute 0.0 0.0 - 0.1 K/uL   WBC Morphology MILD LEFT SHIFT (1-5% METAS, OCC MYELO, OCC BANDS)    Immature Granulocytes 6 %   Abs Immature Granulocytes 0.27 (H) 0.00 - 0.07 K/uL   Tear Drop Cells PRESENT    Polychromasia PRESENT    Ovalocytes PRESENT     Comment: Performed at [facility], [place] [address] [place], [place] [address]  Protime-INR     Status: Abnormal   Collection Time: 06/13/19  6:15 PM  Result Value Ref Range   Prothrombin Time 16.9 (H) 11.4 - 15.2 seconds   INR 1.4 (H) 0.8 - 1.2    Comment: (NOTE) INR goal varies based on device and disease states. Performed at [facility], [place] [address] [place], [place] [address]   Type and screen     Status: None (Preliminary result)   Collection Time: 06/13/19  6:45 PM  Result Value Ref Range   ABO/RH(D) B POS    Antibody Screen NEG    Sample Expiration 06/16/2019,2359    Unit Number [id number]    Blood Component Type RED CELLS,LR    Unit division 00    Status of Unit ISSUED    Transfusion Status OK TO TRANSFUSE    Crossmatch Result  Compatible Performed at [facility], [place] [address], [place], [place] [address]    Unit Number [id number]    Blood Component Type RED CELLS,LR    Unit division 00    Status of Unit ALLOCATED    Transfusion Status OK TO TRANSFUSE    Crossmatch Result Compatible   POC occult blood, ED Provider will collect     Status: Abnormal   Collection Time: 06/13/19  7:45 PM  Result Value Ref Range   Fecal Occult Bld POSITIVE (A) NEGATIVE  Reticulocytes     Status: Abnormal   Collection Time: 06/13/19  8:14 PM  Result Value Ref Range   Retic Ct Pct 3.5 (H) 0.4 - 3.1 %   RBC. 2.22 (L) 3.87 - 5.11 MIL/uL   Retic Count, Absolute 77.5 19.0 - 186.0 K/uL   Immature Retic Fract 37.1 (H) 2.3 - 15.9 %    Comment: Performed at [facility], [place] [address], [place], [place] [address]   CUP PACEART [facility] DEVICE CHECK  Result Date: 06/13/2019 Device checked in clinic by industry. See scanned report. Next home remote 09/10/19. ROV w/ WC in 9 months.Lavenia Atlas, BSN, RN   Pending Labs [employer] (From admission, onward)    Start     Ordered   06/14/19 0500  CBC  Tomorrow morning,   R     06/13/19 2113   06/14/19 [id number]  Basic metabolic panel  Tomorrow morning,   R     06/13/19 2113   06/13/19 2014  Vitamin B12  (Anemia Panel (PNL))  ONCE - STAT,   STAT     06/13/19 2014   06/13/19 2014  Folate  (Anemia Panel (PNL))  ONCE - STAT,   STAT     06/13/19 2014   06/13/19 2014  Iron and TIBC  (Anemia Panel (PNL))  ONCE - STAT,   STAT     06/13/19 2014   06/13/19 2014  Ferritin  (Anemia Panel (PNL))  ONCE - STAT,   STAT     06/13/19 2014   06/13/19 2014  Prepare RBC  (Adult Blood Administration - PRBC)  Once,   R    Question Answer Comment  # of Units 2 units   Transfusion Indications Symptomatic Anemia   Number of Units to Keep Ahead 2 units ahead   If emergent release call blood bank [facility] [id number]      06/13/19 2014           Vitals/Pain Today's Vitals   06/13/19 2030 06/13/19 2100 06/13/19 2130 06/13/19 2200  BP: (!) 115/56 122/89 118/67 115/90  Pulse: 70 69 70 70  Resp: 16 (!) 21 19 (!) 21  Temp:      TempSrc:      SpO2: 95% 95% 95% 95%  PainSc:        Isolation Precautions No active isolations  Medications Medications  0.9 %  sodium chloride infusion (has no administration in time range)  acetaminophen (TYLENOL) tablet 650 mg (has no administration in time range)    Or  acetaminophen (TYLENOL) suppository 650 mg (has no administration in time range)  ondansetron (ZOFRAN) tablet 4 mg (has no administration in time range)    Or  ondansetron (ZOFRAN) injection 4 mg (has no administration in time range)  pantoprazole (PROTONIX) 80 mg in sodium chloride 0.9 % 100 mL IVPB (has no administration in time range)  amLODipine (NORVASC) tablet 10 mg (has no administration in time range)  allopurinol (  ZYLOPRIM) tablet 100 mg (has no administration in time range)  carvedilol (COREG) tablet 6.25 mg (has no administration in time range)  brimonidine (ALPHAGAN) 0.2 % ophthalmic solution 1 drop (has no administration in time range)  sodium bicarbonate tablet 650 mg (has no administration in time range)  sertraline (ZOLOFT) tablet 25 mg (has no administration in time range)  OLANZapine (ZYPREXA) tablet 7.5 mg (7.5 mg Oral Given 06/13/19 2216)  polyethylene glycol (MIRALAX / GLYCOLAX) packet 17 g (has no administration in time range)  levothyroxine (SYNTHROID) tablet 150 mcg (has no administration in time range)  multivitamins with iron tablet 1 tablet (has no administration in time range)  divalproex (DEPAKOTE SPRINKLE) capsule 125 mg (125 mg Oral Given 06/13/19 2217)  cholecalciferol (VITAMIN D) tablet 2,000 Units (has no administration in time range)  fluticasone (FLONASE) 50 MCG/ACT nasal spray 1 spray (has no administration in time range)  latanoprost (XALATAN) 0.005 % ophthalmic solution 1 drop (has no  administration in time range)  acetaminophen (TYLENOL) tablet 1,000 mg (has no administration in time range)  diclofenac Sodium (VOLTAREN) 1 % topical gel 2 g (has no administration in time range)  pantoprazole (PROTONIX) 80 mg in sodium chloride 0.9 % 100 mL IVPB (0 mg Intravenous Stopped 06/13/19 2137)    Mobility walks Moderate fall risk   Focused Assessments GI bleed    R Recommendations: See Admitting Provider Note  Report given to: haley RN  Additional Notes:

## 2019-06-13 NOTE — Progress Notes (Signed)
Electrophysiology Office Note   Date:  06/13/2019   ID:  Daisy Parker, DOB [birth year], MRN [id number]  PCP:  Patient, No Pcp Per  Cardiologist:  Radford Pax Primary Electrophysiologist:  Daisy Clinard Meredith Leeds, MD    Chief Complaint: pacemaker   History of Present Illness: Daisy Parker is a 79 y.o. female who is being seen today for the evaluation of pacemaker at the request of Daisy Parker. Presenting today for electrophysiology evaluation.  She has a history significant for dementia with fall, Mobitz 2 AV block, hypertension.  She presented to the hospital 03/09/2019 and was found to be in 2-1 AV block.  She is now status post [place] dual-chamber pacemaker implanted 03/11/2019.  Today, she denies symptoms of palpitations, chest pain, shortness of breath, orthopnea, PND, lower extremity edema, claudication, dizziness, presyncope, syncope, bleeding, or neurologic sequela. The patient is tolerating medications without difficulties.  She continues to have episodes of weakness and fatigue.  At this point it is unclear to me how often these episodes occur.  She otherwise states that she is having pain in her legs and feels constipated.  She Amin [place]alt apparently receive something for that when she gets back to her facility.   Past Medical History:  Diagnosis Date  . Acute lower UTI 05/17/2019  . AKI (acute kidney injury) ([place]) 05/16/2018  . Anemia   . Bradycardia   . Cataract   . CKD (chronic kidney disease) 05/14/2018  . CKD (chronic kidney disease), stage IV ([place])   . COVID-19 virus infection 05/15/2019  . Dementia ([place])   . DNR (do not resuscitate) discussion   . Duodenal perforation ([place])   . Encephalopathy acute 05/14/2018  . HTN (hypertension) 05/14/2018  . Hypertension   . Hypothyroidism   . Mitral valve mass   . Palliative care by specialist   . Pneumonia due to COVID-19 virus 05/15/2019  . RBBB   . Second degree AV block, Mobitz type I   . Second degree AV block, Mobitz type II   .  Syncope   . Urinary frequency 03/13/2019   Past Surgical History:  Procedure Laterality Date  . PACEMAKER IMPLANT N/A 03/11/2019   Procedure: PACEMAKER IMPLANT;  Surgeon: Daisy Haw, MD;  Location: [facility];  Service: Cardiovascular;  Laterality: N/A;     Current Outpatient Medications  Medication Sig Dispense Refill  . acetaminophen (TYLENOL) 500 MG tablet Take 1,000 mg by mouth 3 (three) times daily. 0800, 1400, 2000.    Marland Kitchen allopurinol (ZYLOPRIM) 100 MG tablet Take 100 mg by mouth daily.    Marland Kitchen amLODipine (NORVASC) 10 MG tablet Take 1 tablet (10 mg total) by mouth at bedtime. 30 tablet 5  . apixaban (ELIQUIS) 5 MG TABS tablet Take 1 tablet (5 mg total) by mouth 2 (two) times daily. 60 tablet 0  . brimonidine (ALPHAGAN) 0.2 % ophthalmic solution Place 1 drop into both eyes 2 (two) times daily.    . carvedilol (COREG) 6.25 MG tablet Take 1 tablet (6.25 mg total) by mouth 2 (two) times daily with a meal. 60 tablet 5  . Cholecalciferol (VITAMIN D) 50 MCG (2000 UT) tablet Take 2,000 Units by mouth daily.    . diclofenac Sodium (VOLTAREN) 1 % GEL Apply 2 g topically 3 (three) times daily. Applied to knees, hips, and lower back.    . divalproex (DEPAKOTE SPRINKLE) 125 MG capsule Take 125 mg by mouth 2 (two) times daily.    . fluticasone (FLONASE) 50 MCG/ACT nasal spray Place  1 spray into both nostrils daily.    Marland Kitchen latanoprost (XALATAN) 0.005 % ophthalmic solution Place 1 drop into both eyes at bedtime.    Marland Kitchen levothyroxine (SYNTHROID) 150 MCG tablet Take 150 mcg by mouth daily before breakfast.    . Multiple Vitamins-Iron (DAILY VITE MULTIVITAMIN/IRON) TABS Take 1 tablet by mouth daily.    Marland Kitchen OLANZapine (ZYPREXA) 7.5 MG tablet Take 7.5 mg by mouth at bedtime.    . polyethylene glycol (MIRALAX / GLYCOLAX) packet Take 17 g by mouth daily. 14 each 0  . sertraline (ZOLOFT) 25 MG tablet     . sodium bicarbonate 650 MG tablet Take 1 tablet (650 mg total) by mouth 2 (two) times daily. 7  tablet 0   No current facility-administered medications for this visit.    Allergies:   Codeine and Sulfa antibiotics   Social History:  The patient  reports that she has quit smoking. She has never used smokeless tobacco. She reports that she does not drink alcohol or use drugs.   Family History:  The patient's Family history is unknown by patient.    ROS:  Please see the history of present illness.   Otherwise, review of systems is positive for none.   All other systems are reviewed and negative.    PHYSICAL EXAM: VS:  BP (!) 106/56   Pulse 70   Ht 5\' 6"  (1.676 m)   SpO2 97%   BMI 26.69 kg/m  , BMI Body mass index is 26.69 kg/m. GEN: Well nourished, well developed, in no acute distress  HEENT: normal  Neck: no JVD, carotid bruits, or masses Cardiac: RRR; no murmurs, rubs, or gallops,no edema  Respiratory:  clear to auscultation bilaterally, normal work of breathing GI: soft, nontender, nondistended, + BS MS: no deformity or atrophy  Skin: warm and dry, device pocket is well healed Neuro:  Strength and sensation are intact Psych: euthymic mood, full affect  EKG:  EKG is not ordered today. Personal review of the ekg ordered 05/16/19 shows sinus rhythm, ventricular paced  Device interrogation is reviewed today in detail.  See PaceArt for details.   Recent Labs: 03/08/2019: TSH 0.510 05/16/2019: B Natriuretic Peptide 156.3 05/20/2019: ALT 82; BUN 82; Creatinine, Ser 2.36; Hemoglobin 9.6; Magnesium 2.0; Platelets 193; Potassium 4.8; Sodium 136    Lipid Panel     Component Value Date/Time   TRIG 180 (H) 05/15/2019 1037     Wt Readings from Last 3 Encounters:  05/15/19 165 lb 5.5 oz (75 kg)  03/13/19 166 lb 0.1 oz (75.3 kg)  05/16/18 202 lb 13.2 oz (92 kg)      Other studies Reviewed: Additional studies/ records that were reviewed today include: TTE 03/08/19  Review of the above records today demonstrates:  1. A small mass is present attached to the chordal  structure of the  posterior mitral valve leaflet, measuring 0.9 cm x 0.9 cm. It moves with  the valve, favoring calcified chordal apparatus. The differential also  includes papillary fibroelastoma and  vegetation. There is no destruction of the valve to suggest vegetation.  Would obtain blood cultures to exclude bacteremia/infective endocarditis  and consider TEE for better characterization if clinically indicated.  2. Left ventricular ejection fraction, by visual estimation, is 65 to  70%. The left ventricle has normal function. There is moderately increased  left ventricular hypertrophy.  3. Left ventricular diastolic parameters are consistent with Grade I  diastolic dysfunction (impaired relaxation).  4. The left ventricle has no regional wall  motion abnormalities.  5. Global right ventricle has normal systolic function.The right  ventricular size is normal. No increase in right ventricular wall  thickness.  6. Left atrial size was normal.  7. Right atrial size was normal.  8. Presence of pericardial fat pad.  9. Trivial pericardial effusion is present.  10. Moderate calcification of the mitral valve leaflet(s).  11. Moderate to severe mitral annular calcification.  12. The mitral valve is degenerative. Trace mitral valve regurgitation.  13. The tricuspid valve is grossly normal. Tricuspid valve regurgitation  is trivial.  14. The aortic valve is tricuspid. Aortic valve regurgitation is not  visualized. Mild to moderate aortic valve sclerosis/calcification without  any evidence of aortic stenosis.  15. The pulmonic valve was grossly normal. Pulmonic valve regurgitation is  trivial.  16. There is mild dilatation of the ascending aorta measuring 37 mm.  17. Normal pulmonary artery systolic pressure.  18. The pericardial effusion is circumferential.    ASSESSMENT AND PLAN:  1.  2-1 AV block: Status post [employer] dual-chamber pacemaker implanted 03/11/2019.  Device  functioning appropriately.  No changes at this time.  2.  Dementia: Likely has an element of sundowning.  Certainly has follow-up with her primary physician about this.  3.  Persistent atrial fibrillation: Found on device interrogation.  CHA2DS2-VASc of 4.  Currently on Eliquis which was recently started.  At this point is unclear to me as to how symptomatic she is from her atrial fibrillation.  Due to that, we Avamarie Crossley not attempt rhythm control.    Current medicines are reviewed at length with the patient today.   The patient does not have concerns regarding her medicines.  The following changes were made today:  none  Labs/ tests ordered today include:  No orders of the defined types were placed in this encounter.    Disposition:   FU with Charnika Herbst 9 months  Signed, Cordaryl Decelles Meredith Leeds, MD  06/13/2019 2:23 PM     [place] [id number] [phone] (office) [phone] (fax)

## 2019-06-13 NOTE — H&P (Signed)
History and Physical    Daisy Parker [id number] DOB: Mar 12, 1941 DOA: 06/13/2019  PCP: Patient, No Pcp Per  Patient coming from: SNF  I have personally briefly reviewed patient's old medical records in [place]  Chief Complaint: Low HGB  HPI: Daisy Parker is a 79 y.o. female with medical history significant of severe dementia, A.Fib on eliquis, PPM, CKD stage 4.  Pt had COVID last month, discharged on 2/8.  PPM (put in late last year) interrogation revealed A.Fib (though asymptomatic), so patient was started on eliquis, and has been on eliquis since last month.  No new complaints at SNF, no report of melena, hematochezia, nor hematemesis, but routine blood work today revealed HGB of 6.6, pt sent in to ED.   ED Course: HGB 6.8 down from 9.6 on 2/8.  Hemoccult positive.  BUN 74, Creat 2.38 (about baseline since DC on 2/8).  2u PRBC transfusion ordered.  GI consulted and hospitalist asked to admit.   Review of Systems: Unable to perform due to advanced dementia.  Past Medical History:  Diagnosis Date  . Acute lower UTI 05/17/2019  . AKI (acute kidney injury) ([place]) 05/16/2018  . Anemia   . Bradycardia   . Cataract   . CKD (chronic kidney disease) 05/14/2018  . CKD (chronic kidney disease), stage IV ([place])   . COVID-19 virus infection 05/15/2019  . Dementia ([place])   . DNR (do not resuscitate) discussion   . Duodenal perforation ([place])   . Encephalopathy acute 05/14/2018  . HTN (hypertension) 05/14/2018  . Hypertension   . Hypothyroidism   . Mitral valve mass   . Palliative care by specialist   . Pneumonia due to COVID-19 virus 05/15/2019  . RBBB   . Second degree AV block, Mobitz type I   . Second degree AV block, Mobitz type II   . Syncope   . Urinary frequency 03/13/2019    Past Surgical History:  Procedure Laterality Date  . PACEMAKER IMPLANT N/A 03/11/2019   Procedure: PACEMAKER IMPLANT;  Surgeon: [name], MD;  Location: [facility];   Service: Cardiovascular;  Laterality: N/A;     reports that she has quit smoking. She has never used smokeless tobacco. She reports that she does not drink alcohol or use drugs.  Allergies  Allergen Reactions  . Codeine Anaphylaxis  . Sulfa Antibiotics Anaphylaxis    Family History  Family history unknown: Yes     Prior to Admission medications   Medication Sig Start Date End Date Taking? Authorizing Provider  acetaminophen (TYLENOL) 500 MG tablet Take 1,000 mg by mouth 3 (three) times daily. 0800, 1400, 2000.   Yes [provider]  allopurinol (ZYLOPRIM) 100 MG tablet Take 100 mg by mouth daily.   Yes [provider]  amLODipine (NORVASC) 10 MG tablet Take 1 tablet (10 mg total) by mouth at bedtime. 03/13/19  Yes Marianna Payment, MD  apixaban (ELIQUIS) 5 MG TABS tablet Take 1 tablet (5 mg total) by mouth 2 (two) times daily. 05/29/19  Yes Fenton, Clint R, PA  brimonidine (ALPHAGAN) 0.2 % ophthalmic solution Place 1 drop into both eyes 2 (two) times daily.   Yes [provider]  carvedilol (COREG) 6.25 MG tablet Take 1 tablet (6.25 mg total) by mouth 2 (two) times daily with a meal. 03/13/19  Yes Marianna Payment, MD  Cholecalciferol (VITAMIN D) 50 MCG (2000 UT) tablet Take 2,000 Units by mouth daily.   Yes [provider]  diclofenac Sodium (VOLTAREN)  1 % GEL Apply 2 g topically 3 (three) times daily. Applied to knees, hips, and lower back. 03/13/19  Yes [provider]  divalproex (DEPAKOTE SPRINKLE) 125 MG capsule Take 125 mg by mouth 2 (two) times daily.   Yes [provider]  fluticasone (FLONASE) 50 MCG/ACT nasal spray Place 1 spray into both nostrils daily.   Yes [provider]  latanoprost (XALATAN) 0.005 % ophthalmic solution Place 1 drop into both eyes at bedtime.   Yes [provider]  levothyroxine (SYNTHROID) 150 MCG tablet Take 150 mcg by mouth daily before breakfast.   Yes [provider]  Multiple  Vitamins-Iron (DAILY VITE MULTIVITAMIN/IRON) TABS Take 1 tablet by mouth daily.   Yes [provider]  OLANZapine (ZYPREXA) 7.5 MG tablet Take 7.5 mg by mouth at bedtime.   Yes [provider]  polyethylene glycol (MIRALAX / GLYCOLAX) packet Take 17 g by mouth daily. 05/17/18  Yes Dorrell, Andree Elk, MD  sertraline (ZOLOFT) 25 MG tablet Take 25 mg by mouth daily.  05/27/19  Yes [provider]  sodium bicarbonate 650 MG tablet Take 1 tablet (650 mg total) by mouth 2 (two) times daily. 05/20/19  Yes Damita Lack, MD    Physical Exam: Vitals:   06/13/19 1930 06/13/19 2000 06/13/19 2030 06/13/19 2100  BP: 126/66 130/66 (!) 115/56 122/89  Pulse: 70 70 70 69  Resp: 14 13 16  (!) 21  Temp:      TempSrc:      SpO2: 94% 97% 95% 95%    Constitutional: NAD, calm, comfortable Eyes: PERRL, lids and conjunctivae normal ENMT: Mucous membranes are moist. Posterior pharynx clear of any exudate or lesions.Normal dentition.  Neck: normal, supple, no masses, no thyromegaly Respiratory: clear to auscultation bilaterally, no wheezing, no crackles. Normal respiratory effort. No accessory muscle use.  Cardiovascular: Regular rate and rhythm, no murmurs / rubs / gallops. No extremity edema. 2+ pedal pulses. No carotid bruits.  Abdomen: no tenderness, no masses palpated. No hepatosplenomegaly. Bowel sounds positive.  Musculoskeletal: no clubbing / cyanosis. No joint deformity upper and lower extremities. Good ROM, no contractures. Normal muscle tone.  Skin: no rashes, lesions, ulcers. No induration Neurologic: CN 2-12 grossly intact. Sensation intact, DTR normal. Strength 5/5 in all 4.  Psychiatric: Demented, tearful affect but this is baseline per daughter until she gets her evening meds.   Labs on Admission: I have personally reviewed following labs and imaging studies  CBC: Recent Labs  Lab 06/13/19 1815  WBC 4.8  NEUTROABS 3.2  HGB 6.8*  HCT 21.5*  MCV 96.4  PLT [id number]    Basic Metabolic Panel: Recent Labs  Lab 06/13/19 1815  NA 134*  K 5.1  CL 103  CO2 22  GLUCOSE 205*  BUN 74*  CREATININE 2.38*  CALCIUM 9.0   GFR: CrCl cannot be calculated (Unknown ideal weight.). Liver Function Tests: Recent Labs  Lab 06/13/19 1815  AST 21  ALT 31  ALKPHOS 78  BILITOT 0.5  PROT 5.5*  ALBUMIN 2.3*   No results for input(s): LIPASE, AMYLASE in the last 168 hours. No results for input(s): AMMONIA in the last 168 hours. Coagulation Profile: Recent Labs  Lab 06/13/19 1815  INR 1.4*   Cardiac Enzymes: No results for input(s): CKTOTAL, CKMB, CKMBINDEX, TROPONINI in the last 168 hours. BNP (last 3 results) No results for input(s): PROBNP in the last 8760 hours. HbA1C: No results for input(s): HGBA1C in the last 72 hours. CBG: No results for  input(s): GLUCAP in the last 168 hours. Lipid Profile: No results for input(s): CHOL, HDL, LDLCALC, TRIG, CHOLHDL, LDLDIRECT in the last 72 hours. Thyroid Function Tests: No results for input(s): TSH, T4TOTAL, FREET4, T3FREE, THYROIDAB in the last 72 hours. Anemia Panel: Recent Labs    06/13/19 2014  RETICCTPCT 3.5*   Urine analysis:    Component Value Date/Time   COLORURINE YELLOW 05/16/2019 1133   APPEARANCEUR TURBID (A) 05/16/2019 1133   LABSPEC 1.019 05/16/2019 1133   PHURINE 5.0 05/16/2019 1133   GLUCOSEU NEGATIVE 05/16/2019 1133   HGBUR NEGATIVE 05/16/2019 1133   BILIRUBINUR NEGATIVE 05/16/2019 1133   KETONESUR NEGATIVE 05/16/2019 1133   PROTEINUR 100 (A) 05/16/2019 1133   NITRITE NEGATIVE 05/16/2019 1133   LEUKOCYTESUR LARGE (A) 05/16/2019 1133    Radiological Exams on Admission: CUP [place]  Result Date: 06/13/2019 Device checked in clinic by industry. See scanned report. Next home remote 09/10/19. ROV w/ WC in 9 months.Lavenia Atlas, BSN, RN   EKG: Independently reviewed.  Assessment/Plan Principal Problem:   Acute blood loss anemia Active Problems:    Dementia (HCC)   HTN (hypertension)   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/min (HCC)   A-fib (HCC)   GI bleed    1. Acute blood loss anemia due to GIB in setting of starting eliquis over the past month for A.Fib - 1. Stop eliquis 2. 2u PRBC transfusion 3. Repeat CBC in AM 4. Tele monitor 5. GI consulted: 1. NPO after MN 2. BID IV protonix 2. A.Fib - 1. Cont Coreg 2. Hold eliquis 3. HTN - 1. Cont coreg 2. Cont amlodipine 4. Dementia - 1. Cont zyprexa, depakote, zoloft 5. CKD stage 4 - 1. Chronic and baseline 2. Repeat BMP in AM  DVT prophylaxis: SCDs Code Status: Full Family Communication: Daughter at bedside Disposition Plan: SNF after admit Consults called: Dr. Rush Landmark Admission status: Admit to inpatient  Severity of Illness: The appropriate patient status for this patient is INPATIENT. Inpatient status is judged to be reasonable and necessary in order to provide the required intensity of service to ensure the patient's safety. The patient's presenting symptoms, physical exam findings, and initial radiographic and laboratory data in the context of their chronic comorbidities is felt to place them at high risk for further clinical deterioration. Furthermore, it is not anticipated that the patient will be medically stable for discharge from the hospital within 2 midnights of admission. The following factors support the patient status of inpatient.   IP status due to GIB with HGB 6.8, requiring transfusion.   * I certify that at the point of admission it is my clinical judgment that the patient will require inpatient hospital care spanning beyond 2 midnights from the point of admission due to high intensity of service, high risk for further deterioration and high frequency of surveillance required.*    Dedrick Heffner M. DO [facility]  How to contact the [facility] Attending or Consulting provider [place] or covering provider during after hours [place],  for this patient?  1. Check the care team in [facility] and look for a) attending/consulting [facility] provider listed and b) the [facility] team listed 2. Log into [email or web address]  Amion Physician Scheduling and messaging for groups and whole hospitals  On call and physician scheduling software for group practices, residents, hospitalists and other medical providers for call, clinic, rotation and shift schedules. On[employer] is a hospital-wide system for scheduling doctors and paging doctors on call.  EasyPlot is for scientific plotting and data analysis.  [email or web address]  and use [place]'s universal password to access. If you do not have the password, please contact the hospital operator.  3. Locate the [facility] provider you are looking for under [facility] and page to a number that you can be directly reached. 4. If you still have difficulty reaching the provider, please page the [facility] (Director on Call) for the Hospitalists listed on amion for assistance.  06/13/2019, 9:55 PM

## 2019-06-13 NOTE — ED Notes (Signed)
Date and time results received: 06/13/19 7:37 PM  (use smartphrase ".now" to insert current time)  Test: Hgb Critical Value: 6.8  Name of Provider Notified: Nanavati  Orders Received? Or Actions Taken?: Orders Received - See Orders for details

## 2019-06-13 NOTE — ED Notes (Signed)
Daisy Parker, daughter, [phone] legal guardian, frontal temple dementia, per daughter. She wants updates and is on her way from [facility].

## 2019-06-13 NOTE — ED Notes (Signed)
Pt lying in bed awake and talking. MD at bedside for DRE. Full monitor on. Will continue to monitor.

## 2019-06-14 DIAGNOSIS — R195 Other fecal abnormalities: Secondary | ICD-10-CM

## 2019-06-14 DIAGNOSIS — Z7901 Long term (current) use of anticoagulants: Secondary | ICD-10-CM

## 2019-06-14 LAB — CBC
HCT: 27.3 % — ABNORMAL LOW (ref 36.0–46.0)
Hemoglobin: 8.7 g/dL — ABNORMAL LOW (ref 12.0–15.0)
MCH: 28.6 pg (ref 26.0–34.0)
MCHC: 31.9 g/dL (ref 30.0–36.0)
MCV: 89.8 fL (ref 80.0–100.0)
Platelets: 196 10*3/uL (ref 150–400)
RBC: 3.04 MIL/uL — ABNORMAL LOW (ref 3.87–5.11)
RDW: 20.9 % — ABNORMAL HIGH (ref 11.5–15.5)
WBC: 5.9 10*3/uL (ref 4.0–10.5)
nRBC: 0.5 % — ABNORMAL HIGH (ref 0.0–0.2)

## 2019-06-14 LAB — BASIC METABOLIC PANEL
Anion gap: 9 (ref 5–15)
BUN: 70 mg/dL — ABNORMAL HIGH (ref 8–23)
CO2: 21 mmol/L — ABNORMAL LOW (ref 22–32)
Calcium: 8.9 mg/dL (ref 8.9–10.3)
Chloride: 106 mmol/L (ref 98–111)
Creatinine, Ser: 2.25 mg/dL — ABNORMAL HIGH (ref 0.44–1.00)
GFR calc Af Amer: 23 mL/min — ABNORMAL LOW (ref >60)
GFR calc non Af Amer: 20 mL/min — ABNORMAL LOW (ref >60)
Glucose, Bld: 153 mg/dL — ABNORMAL HIGH (ref 70–99)
Potassium: 4.9 mmol/L (ref 3.5–5.1)
Sodium: 136 mmol/L (ref 135–145)

## 2019-06-14 LAB — IRON AND TIBC
Iron: 182 ug/dL — ABNORMAL HIGH (ref 28–170)
Saturation Ratios: 55 % — ABNORMAL HIGH (ref 10.4–31.8)
TIBC: 333 ug/dL (ref 250–450)
UIBC: 151 ug/dL

## 2019-06-14 LAB — VITAMIN B12: Vitamin B-12: 929 pg/mL — ABNORMAL HIGH (ref 180–914)

## 2019-06-14 LAB — FOLATE: Folate: 19.6 ng/mL (ref >5.9)

## 2019-06-14 LAB — FERRITIN: Ferritin: 298 ng/mL (ref 11–307)

## 2019-06-14 LAB — HEMOGLOBIN AND HEMATOCRIT, BLOOD
HCT: 27.2 % — ABNORMAL LOW (ref 36.0–46.0)
HCT: 27.5 % — ABNORMAL LOW (ref 36.0–46.0)
Hemoglobin: 8.8 g/dL — ABNORMAL LOW (ref 12.0–15.0)
Hemoglobin: 8.7 g/dL — ABNORMAL LOW (ref 12.0–15.0)

## 2019-06-14 MED ORDER — PEG-KCL-NACL-NASULF-NA ASC-C 100 G PO SOLR: 1.0000 | Freq: Once | ORAL | Status: DC

## 2019-06-14 MED ORDER — PEG-KCL-NACL-NASULF-NA ASC-C 100 G PO SOLR
0.5000 | Freq: Once | ORAL | Status: AC
  Administered 2019-06-15: 100 g via ORAL

## 2019-06-14 MED ORDER — POLYETHYLENE GLYCOL 3350 17 G PO PACK: 17.0000 g | PACK | Freq: Once | ORAL | Status: DC

## 2019-06-14 MED ORDER — PEG-KCL-NACL-NASULF-NA ASC-C 100 G PO SOLR
0.5000 | Freq: Once | ORAL | Status: AC
  Administered 2019-06-14: 100 g via ORAL
  Filled 2019-06-14: qty 1

## 2019-06-14 NOTE — Progress Notes (Signed)
PROGRESS NOTE    Daisy Parker    Code Status: Full Code  EUM:[id number] DOB: 10-15-1940 DOA: 06/13/2019 LOS: 1 days  PCP: Patient, No Pcp Per CC:  Chief Complaint  Patient presents with  . Anemia       Hospital Summary   This is a 79 year old female with history of severe dementia, atrial fibrillation on Eliquis, PPM, CKD 4, recently treated for COVID-19 last month who presented from SNF for anemia found to have Hb 6.6 at facility without associated melena, hematochezia, hematemesis and sent to [facility] ED on 3/4. Found to have Hemoccult positive on presentation and received 2 unit PRBC transfusion. GI was consulted.  A & P   Principal Problem:   Acute blood loss anemia Active Problems:   Dementia (HCC)   HTN (hypertension)   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/min (HCC)   A-fib (HCC)   GI bleed   1. Acute blood loss anemia due to GI bleed in setting of recently starting Eliquis for atrial fibrillation, status post 2 unit PRBC transfusion a. Eliquis on hold b. Patient initially refusing blood draws and physical exam c. Daughter reports adverse reactions to anesthesia in the past d. Follow-up CBC, Hb 8.7 e. GI consulted: Scheduled for EGD/colonoscopy tomorrow 2. Severe dementia a. Refusing exams due to underlying dementia and missing Depakote last night b. Per daughter, please make sure patient gets her Depakote on time c. Continue Zyprexa, Depakote, Zoloft d. Goals of care discussion today with daughter over the phone, per daughter patient has been adamant about being full code in the past on multiple occasions during lucid intervals.  will continue full CODE STATUS 3. CKD 4, at baseline 4. Hypertension a. Continue Coreg and amlodipine 5. Atrial fibrillation, rate controlled a. Continue Coreg b. CHA2DS2-VASc of 4 c. Hold Eliquis  DVT prophylaxis: Contraindicated Family Communication: Patient's daughter has been updated  Disposition Plan:   Patient came  from:   SNF                                                                                          Anticipated d/c place: TBD  Barriers to d/c: Has upcoming EGD/colonoscopy tomorrow, need to ensure hemoglobin stability  Pressure injury documentation    None  Consultants  GI  Procedures  None  Antibiotics   Anti-infectives (From admission, onward)   None        Subjective   Patient seen and examined at bedside. She refused a physical exam and refused to answer questions  Objective   Vitals:   06/14/19 0250 06/14/19 0303 06/14/19 0610 06/14/19 1442  BP: 132/72 128/76 139/80 140/80  Pulse: 70 70 69 70  Resp: '16 17 15   ' Temp: 98.2 F (36.8 C) 97.7 F (36.5 C) (!) 97.5 F (36.4 C) 97.9 F (36.6 C)  TempSrc: Oral Oral Oral Axillary  SpO2: 92% 92% 92% 96%    Intake/Output Summary (Last 24 hours) at 06/14/2019 1610 Last data filed at 06/14/2019 0610 Gross per 24 hour  Intake 120 ml  Output --  Net 120 ml   There were no vitals filed for  this visit.  Examination: Adamantly refusing exam Physical Exam Vitals and nursing note reviewed.  Constitutional:      Comments: Agitated  Pulmonary:     Effort: No respiratory distress.  Skin:    Coloration: Skin is pale.  Neurological:     Mental Status: She is disoriented.     Data Reviewed: I have personally reviewed following labs and imaging studies  CBC: Recent Labs  Lab 06/13/19 1815 06/14/19 1451  WBC 4.8 5.9  NEUTROABS 3.2  --   HGB 6.8* 8.7*  8.8*  HCT 21.5* 27.3*  27.2*  MCV 96.4 89.8  PLT 200 852   Basic Metabolic Panel: Recent Labs  Lab 06/13/19 1815 06/14/19 1451  NA 134* 136  K 5.1 4.9  CL 103 106  CO2 22 21*  GLUCOSE 205* 153*  BUN 74* 70*  CREATININE 2.38* 2.25*  CALCIUM 9.0 8.9   GFR: CrCl cannot be calculated (Unknown ideal weight.). Liver Function Tests: Recent Labs  Lab 06/13/19 1815  AST 21  ALT 31  ALKPHOS 78  BILITOT 0.5  PROT 5.5*  ALBUMIN 2.3*   No results  for input(s): LIPASE, AMYLASE in the last 168 hours. No results for input(s): AMMONIA in the last 168 hours. Coagulation Profile: Recent Labs  Lab 06/13/19 1815  INR 1.4*   Cardiac Enzymes: No results for input(s): CKTOTAL, CKMB, CKMBINDEX, TROPONINI in the last 168 hours. BNP (last 3 results) No results for input(s): PROBNP in the last 8760 hours. HbA1C: No results for input(s): HGBA1C in the last 72 hours. CBG: No results for input(s): GLUCAP in the last 168 hours. Lipid Profile: No results for input(s): CHOL, HDL, LDLCALC, TRIG, CHOLHDL, LDLDIRECT in the last 72 hours. Thyroid Function Tests: No results for input(s): TSH, T4TOTAL, FREET4, T3FREE, THYROIDAB in the last 72 hours. Anemia Panel: Recent Labs    06/13/19 2014  RETICCTPCT 3.5*   Sepsis Labs: No results for input(s): PROCALCITON, LATICACIDVEN in the last 168 hours.  No results found for this or any previous visit (from the past 240 hour(s)).       Radiology Studies: CUP [facility] DEVICE CHECK  Result Date: 06/13/2019 Device checked in clinic by industry. See scanned report. Next home remote 09/10/19. ROV w/ WC in 9 months.Lavenia Atlas, BSN, RN       Scheduled Meds: . acetaminophen  1,000 mg Oral TID  . allopurinol  100 mg Oral Daily  . amLODipine  10 mg Oral QHS  . brimonidine  1 drop Both Eyes BID  . carvedilol  6.25 mg Oral BID WC  . cholecalciferol  2,000 Units Oral Daily  . diclofenac Sodium  2 g Topical TID  . divalproex  125 mg Oral BID  . fluticasone  1 spray Each Nare Daily  . latanoprost  1 drop Both Eyes QHS  . levothyroxine  150 mcg Oral QAC breakfast  . multivitamins with iron  1 tablet Oral Daily  . OLANZapine  7.5 mg Oral QHS  . peg 3350 powder  0.5 kit Oral Once   And  . [START ON 06/15/2019] peg 3350 powder  0.5 kit Oral Once  . polyethylene glycol  17 g Oral Daily  . polyethylene glycol  17 g Oral Once  . sertraline  25 mg Oral Daily  . sodium bicarbonate  650 mg Oral  BID   Continuous Infusions: . pantoprazole (PROTONIX) IV 80 mg (06/14/19 1411)     Time spent: 25 minutes with over 50% of the time  coordinating the patient's care    Harold Hedge, DO [facility] Pager [phone]  Call night coverage person covering after 7pm

## 2019-06-14 NOTE — Progress Notes (Signed)
Initial Nutrition Assessment  DOCUMENTATION CODES:   Not applicable  INTERVENTION:  Boost Breeze po TID, each supplement provides 250 kcal and 9 grams of protein  Continue MVI with minerals daily  Obtain new weights when able   NUTRITION DIAGNOSIS:   Inadequate oral intake related to altered GI function(GIB) as evidenced by (CL diet insufficient to meet estimated needs).    GOAL:   Patient will meet greater than or equal to 90% of their needs  MONITOR:   PO intake, Weight trends, Supplement acceptance, Diet advancement, I & O's, Labs, Skin  REASON FOR ASSESSMENT:   Malnutrition Screening Tool    ASSESSMENT:  79 year old female with past medical history of severe dementia, atrial fibrillation, PPM, CKD stage IV, recently discharged on 2/8 for treatment of COVID infection presented from SNF for evaluation of low Hgb and found hemoccult positive in ED.  Patient admitted on 3/4 for acute blood loss anemia and is s/p 2 units PRBC this admission.   Per notes: -GIB in the setting of recently starting Eliquis -EGD/colonoscopy scheduled for 3/6 -patient refusing exams and blood draws d/t underlying dementia and missing Depakote last night  Patient diet advanced to CL this afternoon, no recorded po intakes for review at this time. Patient NPO at midnight for pending EGD. Will provide Boost Breeze supplement and continue to monitor for diet advancement s/p procedure.  Mild pitting BLE edema noted  per RN assessment.  Current weight 165 lbs recorded on 2/03, per notes new weight unable to be obtained on admit. Recommend new weight when able to fully assess needs.   Medications reviewed and include: Coreg, Vitamin D, Depakote,  MVI with iron, Miralax, Sodium bicarbonate Protonix  Labs: BG 153, BUN 70 (H), Cr 2.25 (H), Hgb 8.7 (L) trending down   NUTRITION - FOCUSED PHYSICAL EXAM: Deferred due to patient mental status   Diet Order:   Diet Order            Diet NPO time  specified  Diet effective midnight        Diet clear liquid Room service appropriate? Yes; Fluid consistency: Thin  Diet effective now              EDUCATION NEEDS:   No education needs have been identified at this time  Skin:  Skin Assessment: Skin Integrity Issues: Skin Integrity Issues:: Other (Comment) Other: MASD; breast; groin  Last BM:  3/5 type 5  Height:   Ht Readings from Last 1 Encounters:  06/13/19 5\' 6"  (1.676 m)    Weight:   Wt Readings from Last 1 Encounters:  05/15/19 75 kg    Ideal Body Weight:  59.1 kg  BMI:  There is no height or weight on file to calculate BMI.  Estimated Nutritional Needs:   Kcal:  1700-1900  Protein:  85-95  Fluid:  >/= 1.7 L/day   Lajuan Lines, RD, LDN Clinical Nutrition After Hours/Weekend Pager # in [place]

## 2019-06-14 NOTE — TOC Initial Note (Signed)
Transition of Care [facility]) - Initial/Assessment Note    Patient Details  Name: Daisy Parker MRN: [id number] Date of Birth: February 29, 1941  Transition of Care [facility]) CM/SW Contact:    Daisy Phi, RN Phone Number: 06/14/2019, 10:20 AM  Clinical Narrative: confirmed from [facility] rep Tammy aware of return back per dtr Daisy Parker.Will send fl2. Has rw,w/c PT eval await recc.                Expected Discharge Plan: Memory Care Barriers to Discharge: Continued Medical Work up   Patient Goals and CMS Choice Patient states their goals for this hospitalization and ongoing recovery are:: return back to [email or web address].gov Compare Post Acute Care list provided to:: Patient Represenative (must comment)(dtr Daisy Parker)    Expected Discharge Plan and Services Expected Discharge Plan: Memory Care   Discharge Planning Services: CM Consult   Living arrangements for the past 2 months: Assisted Living Facility(Memory care)                                      Prior Living Arrangements/Services Living arrangements for the past 2 months: Assisted Living Facility(Memory care) Lives with:: Facility Resident Patient language and need for interpreter reviewed:: Yes Do you feel safe going back to the place where you live?: Yes      Need for Family Participation in Patient Care: No (Comment) Care giver support system in place?: Yes (comment)   Criminal Activity/Legal Involvement Pertinent to Current Situation/Hospitalization: No - Comment as needed  Activities of Daily Living   ADL Screening (condition at time of admission) Patient's cognitive ability adequate to safely complete daily activities?: No Is the patient deaf or have difficulty hearing?: No Does the patient have difficulty seeing, even when wearing glasses/contacts?: No Does the patient have difficulty concentrating, remembering, or making decisions?: Yes Patient able to express need for assistance with  ADLs?: No Does the patient have difficulty dressing or bathing?: Yes Independently performs ADLs?: No Communication: Independent Dressing (OT): Needs assistance Is this a change from baseline?: Pre-admission baseline Grooming: Needs assistance Is this a change from baseline?: Pre-admission baseline Feeding: Needs assistance Is this a change from baseline?: Pre-admission baseline Bathing: Dependent Is this a change from baseline?: Pre-admission baseline Toileting: Needs assistance Is this a change from baseline?: Pre-admission baseline In/Out Bed: Needs assistance Is this a change from baseline?: Pre-admission baseline Walks in Home: Needs assistance Is this a change from baseline?: Pre-admission baseline Does the patient have difficulty walking or climbing stairs?: Yes Weakness of Legs: Both Weakness of Arms/Hands: Both  Permission Sought/Granted Permission sought to share information with : Case Manager Permission granted to share information with : Yes, Verbal Permission Granted  Share Information with NAME: Case manager  Permission granted to share info w AGENCY: [place] granted to share info w Relationship: Mollee Neer [phone]     Emotional Assessment Appearance:: Appears stated age Attitude/Demeanor/Rapport: Gracious Affect (typically observed): Apprehensive Orientation: : Oriented to Self Alcohol / Substance Use: Not Applicable Psych Involvement: No (comment)  Admission diagnosis:  Acute blood loss anemia [D62] Symptomatic anemia [D64.9] Patient Active Problem List   Diagnosis Date Noted  . Acute blood loss anemia 06/13/2019  . A-fib ([place]) 06/13/2019  . GI bleed 06/13/2019  . Acute lower UTI 05/17/2019  . Pneumonia due to COVID-19 virus 05/15/2019  . COVID-19 virus infection 05/15/2019  . Urinary  frequency 03/13/2019  . Palliative care by specialist   . DNR (do not resuscitate) discussion   . Bradycardia   . Mitral  valve mass   . Second degree AV block, Mobitz type I   . Second degree AV block, Mobitz type II   . RBBB   . Syncope 03/08/2019  . AKI (acute kidney injury) ([place]) 05/16/2018  . Encephalopathy acute 05/14/2018  . Dementia ([place]) 05/14/2018  . HTN (hypertension) 05/14/2018  . 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/min ([place]) 05/14/2018   PCP:  Patient, No Pcp Per Pharmacy:   Daisy Parker, [place] [address] [place] [address] Phone: [phone] Fax: [phone]     Social Determinants of Health (SDOH) Interventions    Readmission Risk Interventions No flowsheet data found.

## 2019-06-14 NOTE — H&P (View-Only) (Signed)
Referring Provider:  [facility] Primary Care Physician:  Patient, No Pcp Per Primary Gastroenterologist:  None, unassigned  Reason for Consultation:  Anemia and heme positive stools  HPI: Daisy Parker is a 79 y.o. female with medical history significant of severe dementia, A.Fib on eliquis since 2/18, PPM, CKD stage 4.  Pt had COVID last month, discharged on 2/8.  PPM (put in late last year) interrogation revealed A.Fib (though asymptomatic), so patient was started on eliquis 2/18.  No new complaints at SNF, no report of melena, hematochezia, nor hematemesis, but routine blood work revealed HGB of 6.6 grams, pt sent in to ED.  HGB 6.8 grams down from 9.6 grams on 2/8.  MCV normal.  Iron studies pending.  Hemoccult positive.  BUN 74, Creat 2.38 (about baseline since DC on 2/8).  2u PRBC transfusion ordered.  Patient uncooperative this AM, did not let me examine her and threatened me saying that she would kick me if I touched her.  Apparently refused to let the lab draw post-transfusion CBC as well.  Could not provide any history.  I spoke with her daughter, [name], extensively by phone.  She is not aware that patient has ever had EGD or colonoscopy, but if so then it would have been over 4 years ago.   Past Medical History:  Diagnosis Date  . Acute lower UTI 05/17/2019  . AKI (acute kidney injury) ([place]) 05/16/2018  . Anemia   . Bradycardia   . Cataract   . CKD (chronic kidney disease) 05/14/2018  . CKD (chronic kidney disease), stage IV ([place])   . COVID-19 virus infection 05/15/2019  . Dementia ([place])   . DNR (do not resuscitate) discussion   . Duodenal perforation ([place])   . Encephalopathy acute 05/14/2018  . HTN (hypertension) 05/14/2018  . Hypertension   . Hypothyroidism   . Mitral valve mass   . Palliative care by specialist   . Pneumonia due to COVID-19 virus 05/15/2019  . RBBB   . Second degree AV block, Mobitz type I   . Second degree AV block, Mobitz type II   . Syncope     . Urinary frequency 03/13/2019    Past Surgical History:  Procedure Laterality Date  . PACEMAKER IMPLANT N/A 03/11/2019   Procedure: PACEMAKER IMPLANT;  Surgeon: [name], MD;  Location: [place] [facility];  Service: Cardiovascular;  Laterality: N/A;    Prior to Admission medications   Medication Sig Start Date End Date Taking? Authorizing Provider  acetaminophen (TYLENOL) 500 MG tablet Take 1,000 mg by mouth 3 (three) times daily. 0800, 1400, 2000.   Yes [provider]  allopurinol (ZYLOPRIM) 100 MG tablet Take 100 mg by mouth daily.   Yes [provider]  amLODipine (NORVASC) 10 MG tablet Take 1 tablet (10 mg total) by mouth at bedtime. 03/13/19  Yes Marianna Payment, MD  apixaban (ELIQUIS) 5 MG TABS tablet Take 1 tablet (5 mg total) by mouth 2 (two) times daily. 05/29/19  Yes Fenton, Clint R, PA  brimonidine (ALPHAGAN) 0.2 % ophthalmic solution Place 1 drop into both eyes 2 (two) times daily.   Yes [provider]  carvedilol (COREG) 6.25 MG tablet Take 1 tablet (6.25 mg total) by mouth 2 (two) times daily with a meal. 03/13/19  Yes Marianna Payment, MD  Cholecalciferol (VITAMIN D) 50 MCG (2000 UT) tablet Take 2,000 Units by mouth daily.   Yes [provider]  diclofenac Sodium (VOLTAREN) 1 % GEL Apply 2 g topically  3 (three) times daily. Applied to knees, hips, and lower back. 03/13/19  Yes [provider]  divalproex (DEPAKOTE SPRINKLE) 125 MG capsule Take 125 mg by mouth 2 (two) times daily.   Yes [provider]  fluticasone (FLONASE) 50 MCG/ACT nasal spray Place 1 spray into both nostrils daily.   Yes [provider]  latanoprost (XALATAN) 0.005 % ophthalmic solution Place 1 drop into both eyes at bedtime.   Yes [provider]  levothyroxine (SYNTHROID) 150 MCG tablet Take 150 mcg by mouth daily before breakfast.   Yes [provider]  Multiple Vitamins-Iron (DAILY VITE MULTIVITAMIN/IRON) TABS Take 1  tablet by mouth daily.   Yes [provider]  OLANZapine (ZYPREXA) 7.5 MG tablet Take 7.5 mg by mouth at bedtime.   Yes [provider]  polyethylene glycol (MIRALAX / GLYCOLAX) packet Take 17 g by mouth daily. 05/17/18  Yes Dorrell, Andree Elk, MD  sertraline (ZOLOFT) 25 MG tablet Take 25 mg by mouth daily.  05/27/19  Yes [provider]  sodium bicarbonate 650 MG tablet Take 1 tablet (650 mg total) by mouth 2 (two) times daily. 05/20/19  Yes Damita Lack, MD    Current Facility-Administered Medications  Medication Dose Route Frequency Provider Last Rate Last Admin  . acetaminophen (TYLENOL) tablet 650 mg  650 mg Oral Q6H PRN Etta Quill, DO       Or  . acetaminophen (TYLENOL) suppository 650 mg  650 mg Rectal Q6H PRN Etta Quill, DO      . acetaminophen (TYLENOL) tablet 1,000 mg  1,000 mg Oral TID Etta Quill, DO   1,000 mg at 06/14/19 0046  . allopurinol (ZYLOPRIM) tablet 100 mg  100 mg Oral Daily Jennette Kettle M, DO      . amLODipine (NORVASC) tablet 10 mg  10 mg Oral QHS Jennette Kettle M, DO   10 mg at 06/14/19 0046  . brimonidine (ALPHAGAN) 0.2 % ophthalmic solution 1 drop  1 drop Both Eyes BID Jennette Kettle M, DO   1 drop at 06/14/19 0200  . carvedilol (COREG) tablet 6.25 mg  6.25 mg Oral BID WC Etta Quill, DO      . cholecalciferol (VITAMIN D) tablet 2,000 Units  2,000 Units Oral Daily Alcario Drought, Jared M, DO      . diclofenac Sodium (VOLTAREN) 1 % topical gel 2 g  2 g Topical TID Etta Quill, DO   2 g at 06/14/19 0047  . divalproex (DEPAKOTE SPRINKLE) capsule 125 mg  125 mg Oral BID Etta Quill, DO   125 mg at 06/13/19 2217  . fluticasone (FLONASE) 50 MCG/ACT nasal spray 1 spray  1 spray Each Nare Daily Jennette Kettle M, DO      . latanoprost (XALATAN) 0.005 % ophthalmic solution 1 drop  1 drop Both Eyes QHS Jennette Kettle M, DO   1 drop at 06/14/19 0159  . levothyroxine (SYNTHROID) tablet 150 mcg  150 mcg Oral QAC breakfast  Jennette Kettle M, DO      . multivitamins with iron tablet 1 tablet  1 tablet Oral Daily Jennette Kettle M, DO      . OLANZapine (ZYPREXA) tablet 7.5 mg  7.5 mg Oral QHS Jennette Kettle M, DO   7.5 mg at 06/13/19 2216  . ondansetron (ZOFRAN) tablet 4 mg  4 mg Oral Q6H PRN Etta Quill, DO       Or  . ondansetron [facility]) injection 4 mg  4  mg Intravenous Q6H PRN Etta Quill, DO      . pantoprazole (PROTONIX) 80 mg in sodium chloride 0.9 % 100 mL IVPB  80 mg Intravenous Q12H Jennette Kettle M, DO      . polyethylene glycol (MIRALAX / GLYCOLAX) packet 17 g  17 g Oral Daily Alcario Drought, Jared M, DO      . sertraline (ZOLOFT) tablet 25 mg  25 mg Oral Daily Jennette Kettle M, DO      . sodium bicarbonate tablet 650 mg  650 mg Oral BID Etta Quill, DO   650 mg at 06/14/19 0046    Allergies as of 06/13/2019 - Review Complete 06/13/2019  Allergen Reaction Noted  . Codeine Anaphylaxis 05/14/2018  . Sulfa antibiotics Anaphylaxis 05/14/2018    Family History  Family history unknown: Yes    Social History   Socioeconomic History  . Marital status: Widowed    Spouse name: Not on file  . Number of children: Not on file  . Years of education: Not on file  . Highest education level: Not on file  Occupational History  . Not on file  Tobacco Use  . Smoking status: Former Research scientist (life sciences)  . Smokeless tobacco: Never Used  Substance and Sexual Activity  . Alcohol use: Never  . Drug use: Never  . Sexual activity: Not on file  Other Topics Concern  . Not on file  Social History Narrative  . Not on file   Social Determinants of Health   Financial Resource Strain:   . Difficulty of Paying Living Expenses: Not on file  Food Insecurity:   . Worried About Charity fundraiser in the Last Year: Not on file  . Ran Out of Food in the Last Year: Not on file  Transportation Needs:   . Lack of Transportation (Medical): Not on file  . Lack of Transportation (Non-Medical): Not on file  Physical  Activity:   . Days of Exercise per Week: Not on file  . Minutes of Exercise per Session: Not on file  Stress:   . Feeling of Stress : Not on file  Social Connections:   . Frequency of Communication with Friends and Family: Not on file  . Frequency of Social Gatherings with Friends and Family: Not on file  . Attends Religious Services: Not on file  . Active Member of Clubs or Organizations: Not on file  . Attends Archivist Meetings: Not on file  . Marital Status: Not on file  Intimate Partner Violence:   . Fear of Current or Ex-Partner: Not on file  . Emotionally Abused: Not on file  . Physically Abused: Not on file  . Sexually Abused: Not on file    Review of Systems: Unable to obtain due to mental status.  Physical Exam: Vital signs in last 24 hours: Temp:  [97.5 F (36.4 C)-98.2 F (36.8 C)] 97.5 F (36.4 C) (03/05 0610) Pulse Rate:  [69-70] 69 (03/05 0610) Resp:  [12-21] 15 (03/05 0610) BP: (106-145)/(49-90) 139/80 (03/05 0610) SpO2:  [92 %-97 %] 92 % (03/05 0610) Last BM Date: 06/14/19 General:   Alert, uncooperative, unable to examine as patient refused and was threatening staff.  Intake/Output from previous day: 03/04 0701 - 03/05 0700 In: 120 [I.V.:20; IV Piggyback:100] Out: -   Lab Results: Recent Labs    06/13/19 1815  WBC 4.8  HGB 6.8*  HCT 21.5*  PLT 200   BMET Recent Labs    06/13/19 1815  NA 134*  K 5.1  CL 103  CO2 22  GLUCOSE 205*  BUN 74*  CREATININE 2.38*  CALCIUM 9.0   LFT Recent Labs    06/13/19 1815  PROT 5.5*  ALBUMIN 2.3*  AST 21  ALT 31  ALKPHOS 78  BILITOT 0.5   PT/INR Recent Labs    06/13/19 1815  LABPROT 16.9*  INR 1.4*    IMPRESSION:  *Anemia and heme positive stools:  No overt bleeding.  Hgb 6.8 grams down from 9.6 grams 3 weeks ago.  No history of EGD and colon to daughter's knowledge.  Transfused 2 units PRBC's. *Severe dementia:  Patient uncooperative this AM. *Atrial fibrillation:  Placed on  Eliquis 2/18 with last dose 3/4.  On hold for now.  PLAN: *I spoke with the daughter, [name], extensively by phone.  She would like to proceed with both EGD and colonoscopy if able to get her mother to drink the bowel prep.  She plans to come in this afternoon and is going to see about staying overnight to try to encourage her to drink it and be cooperative.  Will tentatively plan for EGD and colonoscopy on 3/6. *Monitor Hgb and transfuse further prn.   Laban Emperor. Zehr  06/14/2019, 9:00 AM    Attending Physician Note   I have taken a history, examined the patient and reviewed the chart. I agree with the Advanced Practitioner's note, impression and recommendations.  * Anemia and occult blood in stool, recently started on Eliquis for afib * Dementia, patent refused to allow a physical exam this morning  * Hold Eliquis. Primary service to reconsider risks/benefits of anticoagulation use.  * Trend CBC, transfuse to maintain Hgb  > 7 * Schedule colonoscopy/EGD for tomorrow. Daughter will assist with bowel prep. If unable to adequately prep we can proceed with EGD.   Lucio Edward, MD [facility]

## 2019-06-14 NOTE — Progress Notes (Signed)
Pt placed on contact precautions, will continue to monitor.

## 2019-06-14 NOTE — Progress Notes (Signed)
Unable to complete admissions questions due to pt mental status.

## 2019-06-14 NOTE — Consult Note (Addendum)
Referring Provider:  [facility] Primary Care Physician:  Patient, No Pcp Per Primary Gastroenterologist:  None, unassigned  Reason for Consultation:  Anemia and heme positive stools  HPI: Daisy Parker is a 79 y.o. female with medical history significant of severe dementia, A.Fib on eliquis since 2/18, PPM, CKD stage 4.  Pt had COVID last month, discharged on 2/8.  PPM (put in late last year) interrogation revealed A.Fib (though asymptomatic), so patient was started on eliquis 2/18.  No new complaints at SNF, no report of melena, hematochezia, nor hematemesis, but routine blood work revealed HGB of 6.6 grams, pt sent in to ED.  HGB 6.8 grams down from 9.6 grams on 2/8.  MCV normal.  Iron studies pending.  Hemoccult positive.  BUN 74, Creat 2.38 (about baseline since DC on 2/8).  2u PRBC transfusion ordered.  Patient uncooperative this AM, did not let me examine her and threatened me saying that she would kick me if I touched her.  Apparently refused to let the lab draw post-transfusion CBC as well.  Could not provide any history.  I spoke with her daughter, [name], extensively by phone.  She is not aware that patient has ever had EGD or colonoscopy, but if so then it would have been over 4 years ago.   Past Medical History:  Diagnosis Date  . Acute lower UTI 05/17/2019  . AKI (acute kidney injury) ([place]) 05/16/2018  . Anemia   . Bradycardia   . Cataract   . CKD (chronic kidney disease) 05/14/2018  . CKD (chronic kidney disease), stage IV ([place])   . COVID-19 virus infection 05/15/2019  . Dementia ([place])   . DNR (do not resuscitate) discussion   . Duodenal perforation ([place])   . Encephalopathy acute 05/14/2018  . HTN (hypertension) 05/14/2018  . Hypertension   . Hypothyroidism   . Mitral valve mass   . Palliative care by specialist   . Pneumonia due to COVID-19 virus 05/15/2019  . RBBB   . Second degree AV block, Mobitz type I   . Second degree AV block, Mobitz type II   . Syncope     . Urinary frequency 03/13/2019    Past Surgical History:  Procedure Laterality Date  . PACEMAKER IMPLANT N/A 03/11/2019   Procedure: PACEMAKER IMPLANT;  Surgeon: [name], MD;  Location: [facility];  Service: Cardiovascular;  Laterality: N/A;    Prior to Admission medications   Medication Sig Start Date End Date Taking? Authorizing Provider  acetaminophen (TYLENOL) 500 MG tablet Take 1,000 mg by mouth 3 (three) times daily. 0800, 1400, 2000.   Yes [provider]  allopurinol (ZYLOPRIM) 100 MG tablet Take 100 mg by mouth daily.   Yes [provider]  amLODipine (NORVASC) 10 MG tablet Take 1 tablet (10 mg total) by mouth at bedtime. 03/13/19  Yes Marianna Payment, MD  apixaban (ELIQUIS) 5 MG TABS tablet Take 1 tablet (5 mg total) by mouth 2 (two) times daily. 05/29/19  Yes Fenton, Clint R, PA  brimonidine (ALPHAGAN) 0.2 % ophthalmic solution Place 1 drop into both eyes 2 (two) times daily.   Yes [provider]  carvedilol (COREG) 6.25 MG tablet Take 1 tablet (6.25 mg total) by mouth 2 (two) times daily with a meal. 03/13/19  Yes Marianna Payment, MD  Cholecalciferol (VITAMIN D) 50 MCG (2000 UT) tablet Take 2,000 Units by mouth daily.   Yes [provider]  diclofenac Sodium (VOLTAREN) 1 % GEL Apply 2 g topically  3 (three) times daily. Applied to knees, hips, and lower back. 03/13/19  Yes [provider]  divalproex (DEPAKOTE SPRINKLE) 125 MG capsule Take 125 mg by mouth 2 (two) times daily.   Yes [provider]  fluticasone (FLONASE) 50 MCG/ACT nasal spray Place 1 spray into both nostrils daily.   Yes [provider]  latanoprost (XALATAN) 0.005 % ophthalmic solution Place 1 drop into both eyes at bedtime.   Yes [provider]  levothyroxine (SYNTHROID) 150 MCG tablet Take 150 mcg by mouth daily before breakfast.   Yes [provider]  Multiple Vitamins-Iron (DAILY VITE MULTIVITAMIN/IRON) TABS Take 1  tablet by mouth daily.   Yes [provider]  OLANZapine (ZYPREXA) 7.5 MG tablet Take 7.5 mg by mouth at bedtime.   Yes [provider]  polyethylene glycol (MIRALAX / GLYCOLAX) packet Take 17 g by mouth daily. 05/17/18  Yes Dorrell, Andree Elk, MD  sertraline (ZOLOFT) 25 MG tablet Take 25 mg by mouth daily.  05/27/19  Yes [provider]  sodium bicarbonate 650 MG tablet Take 1 tablet (650 mg total) by mouth 2 (two) times daily. 05/20/19  Yes Damita Lack, MD    Current Facility-Administered Medications  Medication Dose Route Frequency Provider Last Rate Last Admin  . acetaminophen (TYLENOL) tablet 650 mg  650 mg Oral Q6H PRN Etta Quill, DO       Or  . acetaminophen (TYLENOL) suppository 650 mg  650 mg Rectal Q6H PRN Etta Quill, DO      . acetaminophen (TYLENOL) tablet 1,000 mg  1,000 mg Oral TID Etta Quill, DO   1,000 mg at 06/14/19 0046  . allopurinol (ZYLOPRIM) tablet 100 mg  100 mg Oral Daily Jennette Kettle M, DO      . amLODipine (NORVASC) tablet 10 mg  10 mg Oral QHS Jennette Kettle M, DO   10 mg at 06/14/19 0046  . brimonidine (ALPHAGAN) 0.2 % ophthalmic solution 1 drop  1 drop Both Eyes BID Jennette Kettle M, DO   1 drop at 06/14/19 0200  . carvedilol (COREG) tablet 6.25 mg  6.25 mg Oral BID WC Etta Quill, DO      . cholecalciferol (VITAMIN D) tablet 2,000 Units  2,000 Units Oral Daily Alcario Drought, Jared M, DO      . diclofenac Sodium (VOLTAREN) 1 % topical gel 2 g  2 g Topical TID Etta Quill, DO   2 g at 06/14/19 0047  . divalproex (DEPAKOTE SPRINKLE) capsule 125 mg  125 mg Oral BID Etta Quill, DO   125 mg at 06/13/19 2217  . fluticasone (FLONASE) 50 MCG/ACT nasal spray 1 spray  1 spray Each Nare Daily Jennette Kettle M, DO      . latanoprost (XALATAN) 0.005 % ophthalmic solution 1 drop  1 drop Both Eyes QHS Jennette Kettle M, DO   1 drop at 06/14/19 0159  . levothyroxine (SYNTHROID) tablet 150 mcg  150 mcg Oral QAC breakfast  Jennette Kettle M, DO      . multivitamins with iron tablet 1 tablet  1 tablet Oral Daily Jennette Kettle M, DO      . OLANZapine (ZYPREXA) tablet 7.5 mg  7.5 mg Oral QHS Jennette Kettle M, DO   7.5 mg at 06/13/19 2216  . ondansetron (ZOFRAN) tablet 4 mg  4 mg Oral Q6H PRN Etta Quill, DO       Or  . ondansetron [facility]) injection 4 mg  4  mg Intravenous Q6H PRN Etta Quill, DO      . pantoprazole (PROTONIX) 80 mg in sodium chloride 0.9 % 100 mL IVPB  80 mg Intravenous Q12H Jennette Kettle M, DO      . polyethylene glycol (MIRALAX / GLYCOLAX) packet 17 g  17 g Oral Daily Alcario Drought, Jared M, DO      . sertraline (ZOLOFT) tablet 25 mg  25 mg Oral Daily Jennette Kettle M, DO      . sodium bicarbonate tablet 650 mg  650 mg Oral BID Etta Quill, DO   650 mg at 06/14/19 0046    Allergies as of 06/13/2019 - Review Complete 06/13/2019  Allergen Reaction Noted  . Codeine Anaphylaxis 05/14/2018  . Sulfa antibiotics Anaphylaxis 05/14/2018    Family History  Family history unknown: Yes    Social History   Socioeconomic History  . Marital status: Widowed    Spouse name: Not on file  . Number of children: Not on file  . Years of education: Not on file  . Highest education level: Not on file  Occupational History  . Not on file  Tobacco Use  . Smoking status: Former Research scientist (life sciences)  . Smokeless tobacco: Never Used  Substance and Sexual Activity  . Alcohol use: Never  . Drug use: Never  . Sexual activity: Not on file  Other Topics Concern  . Not on file  Social History Narrative  . Not on file   Social Determinants of Health   Financial Resource Strain:   . Difficulty of Paying Living Expenses: Not on file  Food Insecurity:   . Worried About Charity fundraiser in the Last Year: Not on file  . Ran Out of Food in the Last Year: Not on file  Transportation Needs:   . Lack of Transportation (Medical): Not on file  . Lack of Transportation (Non-Medical): Not on file  Physical  Activity:   . Days of Exercise per Week: Not on file  . Minutes of Exercise per Session: Not on file  Stress:   . Feeling of Stress : Not on file  Social Connections:   . Frequency of Communication with Friends and Family: Not on file  . Frequency of Social Gatherings with Friends and Family: Not on file  . Attends Religious Services: Not on file  . Active Member of Clubs or Organizations: Not on file  . Attends Archivist Meetings: Not on file  . Marital Status: Not on file  Intimate Partner Violence:   . Fear of Current or Ex-Partner: Not on file  . Emotionally Abused: Not on file  . Physically Abused: Not on file  . Sexually Abused: Not on file    Review of Systems: Unable to obtain due to mental status.  Physical Exam: Vital signs in last 24 hours: Temp:  [97.5 F (36.4 C)-98.2 F (36.8 C)] 97.5 F (36.4 C) (03/05 0610) Pulse Rate:  [69-70] 69 (03/05 0610) Resp:  [12-21] 15 (03/05 0610) BP: (106-145)/(49-90) 139/80 (03/05 0610) SpO2:  [92 %-97 %] 92 % (03/05 0610) Last BM Date: 06/14/19 General:   Alert, uncooperative, unable to examine as patient refused and was threatening staff.  Intake/Output from previous day: 03/04 0701 - 03/05 0700 In: 120 [I.V.:20; IV Piggyback:100] Out: -   Lab Results: Recent Labs    06/13/19 1815  WBC 4.8  HGB 6.8*  HCT 21.5*  PLT 200   BMET Recent Labs    06/13/19 1815  NA 134*  K 5.1  CL 103  CO2 22  GLUCOSE 205*  BUN 74*  CREATININE 2.38*  CALCIUM 9.0   LFT Recent Labs    06/13/19 1815  PROT 5.5*  ALBUMIN 2.3*  AST 21  ALT 31  ALKPHOS 78  BILITOT 0.5   PT/INR Recent Labs    06/13/19 1815  LABPROT 16.9*  INR 1.4*    IMPRESSION:  *Anemia and heme positive stools:  No overt bleeding.  Hgb 6.8 grams down from 9.6 grams 3 weeks ago.  No history of EGD and colon to daughter's knowledge.  Transfused 2 units PRBC's. *Severe dementia:  Patient uncooperative this AM. *Atrial fibrillation:  Placed on  Eliquis 2/18 with last dose 3/4.  On hold for now.  PLAN: *I spoke with the daughter, [name], extensively by phone.  She would like to proceed with both EGD and colonoscopy if able to get her mother to drink the bowel prep.  She plans to come in this afternoon and is going to see about staying overnight to try to encourage her to drink it and be cooperative.  Will tentatively plan for EGD and colonoscopy on 3/6. *Monitor Hgb and transfuse further prn.   Laban Emperor. Zehr  06/14/2019, 9:00 AM    Attending Physician Note   I have taken a history, examined the patient and reviewed the chart. I agree with the Advanced Practitioner's note, impression and recommendations.  * Anemia and occult blood in stool, recently started on Eliquis for afib * Dementia, patent refused to allow a physical exam this morning  * Hold Eliquis. Primary service to reconsider risks/benefits of anticoagulation use.  * Trend CBC, transfuse to maintain Hgb  > 7 * Schedule colonoscopy/EGD for tomorrow. Daughter will assist with bowel prep. If unable to adequately prep we can proceed with EGD.   Lucio Edward, MD [facility]

## 2019-06-14 NOTE — Plan of Care (Signed)

## 2019-06-14 NOTE — Progress Notes (Signed)
PT Cancellation Note  Patient Details Name: Daisy Parker MRN: [id number] DOB: 12-02-1940   Cancelled Treatment:    Reason Eval/Treat Not Completed: Fatigue/lethargy limiting ability to participate. Spoke with dtr at length, pt has had overall decline in mobility the last few mos  however was ambulatory with walker at her memory care will follow up as schedule allows   [facility] 06/14/2019, 3:26 PM

## 2019-06-14 NOTE — NC FL2 (Signed)
[place] LEVEL OF CARE SCREENING TOOL     IDENTIFICATION  Patient Name: Daisy Parker Birthdate: 1940/11/03 Sex: female Admission Date (Current Location): 06/13/2019  [facility] [place] Number:  Herbalist and Address:  [facility],  [address], [place]      Provider Number: [id number]  Attending Physician Name and Address:  Harold Hedge, MD  Relative Name and Phone Number:  Adaijah Endres [phone]    Current Level of Care: Hospital Recommended Level of Care: Memory Care Prior Approval Number:    Date Approved/Denied:   PASRR Number:    Discharge Plan: Other (Comment)(Memory care [facility]F-[facility])    Current Diagnoses: Patient Active Problem List   Diagnosis Date Noted  . Acute blood loss anemia 06/13/2019  . A-fib ([place]) 06/13/2019  . GI bleed 06/13/2019  . Acute lower UTI 05/17/2019  . Pneumonia due to COVID-19 virus 05/15/2019  . COVID-19 virus infection 05/15/2019  . Urinary frequency 03/13/2019  . Palliative care by specialist   . DNR (do not resuscitate) discussion   . Bradycardia   . Mitral valve mass   . Second degree AV block, Mobitz type I   . Second degree AV block, Mobitz type II   . RBBB   . Syncope 03/08/2019  . AKI (acute kidney injury) ([place]) 05/16/2018  . Encephalopathy acute 05/14/2018  . Dementia ([place]) 05/14/2018  . HTN (hypertension) 05/14/2018  . 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/min ([facility]) 05/14/2018    Orientation RESPIRATION BLADDER Height & Weight     Self  Normal Incontinent Weight:   Height:     BEHAVIORAL SYMPTOMS/MOOD NEUROLOGICAL BOWEL NUTRITION STATUS      Incontinent Diet(Regular)  AMBULATORY STATUS COMMUNICATION OF NEEDS Skin   Limited Assist Verbally Normal                       Personal Care Assistance Level of Assistance  Bathing, Feeding, Dressing Bathing Assistance: Limited assistance Feeding assistance: Limited  assistance Dressing Assistance: Limited assistance     Functional Limitations Info  Sight, Hearing, Speech Sight Info: Impaired(eyeglasses) Hearing Info: Adequate Speech Info: Adequate    SPECIAL CARE FACTORS FREQUENCY  PT (By licensed PT), OT (By licensed OT)     PT Frequency: 5x week OT Frequency: 5x week            Contractures Contractures Info: Not present    Additional Factors Info  Code Status, Allergies, Isolation Precautions(ESBL;covid+2019) Code Status Info: Full code Allergies Info: Codeine, sulfa antibiotics           Current Medications (06/14/2019):  This is the current hospital active medication list Current Facility-Administered Medications  Medication Dose Route Frequency Provider Last Rate Last Admin  . acetaminophen (TYLENOL) tablet 650 mg  650 mg Oral Q6H PRN Etta Quill, DO       Or  . acetaminophen (TYLENOL) suppository 650 mg  650 mg Rectal Q6H PRN Etta Quill, DO      . acetaminophen (TYLENOL) tablet 1,000 mg  1,000 mg Oral TID Etta Quill, DO   1,000 mg at 06/14/19 0046  . allopurinol (ZYLOPRIM) tablet 100 mg  100 mg Oral Daily Jennette Kettle M, DO      . amLODipine (NORVASC) tablet 10 mg  10 mg Oral QHS Jennette Kettle M, DO   10 mg at 06/14/19 0046  . brimonidine (ALPHAGAN) 0.2 % ophthalmic solution 1  drop  1 drop Both Eyes BID Jennette Kettle M, DO   1 drop at 06/14/19 0200  . carvedilol (COREG) tablet 6.25 mg  6.25 mg Oral BID WC Etta Quill, DO      . cholecalciferol (VITAMIN D) tablet 2,000 Units  2,000 Units Oral Daily Alcario Drought, Jared M, DO      . diclofenac Sodium (VOLTAREN) 1 % topical gel 2 g  2 g Topical TID Etta Quill, DO   2 g at 06/14/19 0047  . divalproex (DEPAKOTE SPRINKLE) capsule 125 mg  125 mg Oral BID Etta Quill, DO   125 mg at 06/13/19 2217  . fluticasone (FLONASE) 50 MCG/ACT nasal spray 1 spray  1 spray Each Nare Daily Jennette Kettle M, DO      . latanoprost (XALATAN) 0.005 % ophthalmic solution 1  drop  1 drop Both Eyes QHS Jennette Kettle M, DO   1 drop at 06/14/19 0159  . levothyroxine (SYNTHROID) tablet 150 mcg  150 mcg Oral QAC breakfast Jennette Kettle M, DO      . multivitamins with iron tablet 1 tablet  1 tablet Oral Daily Jennette Kettle M, DO      . OLANZapine (ZYPREXA) tablet 7.5 mg  7.5 mg Oral QHS Jennette Kettle M, DO   7.5 mg at 06/13/19 2216  . ondansetron (ZOFRAN) tablet 4 mg  4 mg Oral Q6H PRN Etta Quill, DO       Or  . ondansetron [facility]) injection 4 mg  4 mg Intravenous Q6H PRN Etta Quill, DO      . pantoprazole (PROTONIX) 80 mg in sodium chloride 0.9 % 100 mL IVPB  80 mg Intravenous Q12H Jennette Kettle M, DO      . polyethylene glycol (MIRALAX / GLYCOLAX) packet 17 g  17 g Oral Daily Alcario Drought, Jared M, DO      . sertraline (ZOLOFT) tablet 25 mg  25 mg Oral Daily Alcario Drought, Jared M, DO      . sodium bicarbonate tablet 650 mg  650 mg Oral BID Jennette Kettle M, DO   650 mg at 06/14/19 1779     Discharge Medications: Please see discharge summary for a list of discharge medications.  Relevant Imaging Results:  Relevant Lab Results:   Additional Information ss#[id number]  Lior Hoen, Juliann Pulse, [place]

## 2019-06-15 ENCOUNTER — Encounter (HOSPITAL_COMMUNITY): Payer: Self-pay | Admitting: Internal Medicine

## 2019-06-15 ENCOUNTER — Inpatient Hospital Stay (HOSPITAL_COMMUNITY): Payer: Medicare Other | Admitting: Certified Registered"

## 2019-06-15 ENCOUNTER — Encounter (HOSPITAL_COMMUNITY)
Admission: EM | Disposition: A | Discharge status: Skilled Nursing Facility | Payer: Self-pay | Source: Skilled Nursing Facility | Attending: Internal Medicine

## 2019-06-15 DIAGNOSIS — R195 Other fecal abnormalities: Secondary | ICD-10-CM

## 2019-06-15 DIAGNOSIS — K222 Esophageal obstruction: Secondary | ICD-10-CM

## 2019-06-15 DIAGNOSIS — K269 Duodenal ulcer, unspecified as acute or chronic, without hemorrhage or perforation: Secondary | ICD-10-CM

## 2019-06-15 DIAGNOSIS — D649 Anemia, unspecified: Secondary | ICD-10-CM

## 2019-06-15 DIAGNOSIS — K2981 Duodenitis with bleeding: Secondary | ICD-10-CM

## 2019-06-15 DIAGNOSIS — K2980 Duodenitis without bleeding: Principal | ICD-10-CM

## 2019-06-15 HISTORY — PX: BIOPSY: SHX5522

## 2019-06-15 HISTORY — PX: ESOPHAGOGASTRODUODENOSCOPY (EGD) WITH PROPOFOL: SHX5813

## 2019-06-15 LAB — TYPE AND SCREEN
ABO/RH(D): B POS
Antibody Screen: NEGATIVE
Unit division: 0
Unit division: 0

## 2019-06-15 LAB — BPAM RBC
Blood Product Expiration Date: 202103172359
Blood Product Expiration Date: 202103192359
ISSUE DATE / TIME: 202103042254
ISSUE DATE / TIME: 202103050243
Unit Type and Rh: 7300
Unit Type and Rh: 7300

## 2019-06-15 LAB — BASIC METABOLIC PANEL
Anion gap: 7 (ref 5–15)
BUN: 63 mg/dL — ABNORMAL HIGH (ref 8–23)
CO2: 22 mmol/L (ref 22–32)
Calcium: 8.8 mg/dL — ABNORMAL LOW (ref 8.9–10.3)
Chloride: 108 mmol/L (ref 98–111)
Creatinine, Ser: 2.3 mg/dL — ABNORMAL HIGH (ref 0.44–1.00)
GFR calc Af Amer: 23 mL/min — ABNORMAL LOW (ref >60)
GFR calc non Af Amer: 20 mL/min — ABNORMAL LOW (ref >60)
Glucose, Bld: 107 mg/dL — ABNORMAL HIGH (ref 70–99)
Potassium: 4.9 mmol/L (ref 3.5–5.1)
Sodium: 137 mmol/L (ref 135–145)

## 2019-06-15 LAB — CBC
HCT: 26.7 % — ABNORMAL LOW (ref 36.0–46.0)
Hemoglobin: 8.4 g/dL — ABNORMAL LOW (ref 12.0–15.0)
MCH: 28.4 pg (ref 26.0–34.0)
MCHC: 31.5 g/dL (ref 30.0–36.0)
MCV: 90.2 fL (ref 80.0–100.0)
Platelets: 189 10*3/uL (ref 150–400)
RBC: 2.96 MIL/uL — ABNORMAL LOW (ref 3.87–5.11)
RDW: 21.2 % — ABNORMAL HIGH (ref 11.5–15.5)
WBC: 5.5 10*3/uL (ref 4.0–10.5)
nRBC: 0.5 % — ABNORMAL HIGH (ref 0.0–0.2)

## 2019-06-15 SURGERY — ESOPHAGOGASTRODUODENOSCOPY (EGD) WITH PROPOFOL
Anesthesia: General

## 2019-06-15 MED ORDER — FENTANYL CITRATE (PF) 100 MCG/2ML IJ SOLN
INTRAMUSCULAR | Status: AC
  Filled 2019-06-15: qty 2

## 2019-06-15 MED ORDER — PANTOPRAZOLE SODIUM 40 MG IV SOLR
40.0000 mg | Freq: Two times a day (BID) | INTRAVENOUS | Status: DC
  Administered 2019-06-15 – 2019-06-18 (×7): 40 mg via INTRAVENOUS
  Filled 2019-06-15 (×7): qty 40

## 2019-06-15 MED ORDER — PROPOFOL 10 MG/ML IV BOLUS
INTRAVENOUS | Status: DC | PRN
  Administered 2019-06-15 (×2): 20 mg via INTRAVENOUS

## 2019-06-15 MED ORDER — HYDROMORPHONE HCL 1 MG/ML IJ SOLN: 0.2500 mg | INTRAMUSCULAR | Status: DC | PRN

## 2019-06-15 MED ORDER — LACTATED RINGERS IV SOLN: INTRAVENOUS | Status: DC

## 2019-06-15 MED ORDER — MEPERIDINE HCL 25 MG/ML IJ SOLN: 6.2500 mg | INTRAMUSCULAR | Status: DC | PRN

## 2019-06-15 MED ORDER — ONDANSETRON HCL 4 MG/2ML IJ SOLN: 4.0000 mg | Freq: Once | INTRAMUSCULAR | Status: DC | PRN

## 2019-06-15 MED ORDER — SODIUM CHLORIDE 0.9 % IV SOLN: INTRAVENOUS | Status: DC

## 2019-06-15 MED ORDER — PHENYLEPHRINE 40 MCG/ML (10ML) SYRINGE FOR IV PUSH (FOR BLOOD PRESSURE SUPPORT)
PREFILLED_SYRINGE | INTRAVENOUS | Status: DC | PRN
  Administered 2019-06-15: 120 ug via INTRAVENOUS

## 2019-06-15 MED ORDER — PROPOFOL 10 MG/ML IV BOLUS
INTRAVENOUS | Status: AC
  Filled 2019-06-15: qty 40

## 2019-06-15 MED ORDER — PROPOFOL 500 MG/50ML IV EMUL
INTRAVENOUS | Status: DC | PRN
  Administered 2019-06-15: 50 ug/kg/min via INTRAVENOUS

## 2019-06-15 SURGICAL SUPPLY — 25 items

## 2019-06-15 NOTE — Interval H&P Note (Signed)
History and Physical Interval Note:  06/15/2019 8:13 AM  Daisy Parker  has presented today for surgery, with the diagnosis of Anemia and heme positive stool.  The various methods of treatment have been discussed with the patient and family. After consideration of risks, benefits and other options for treatment, the patient has consented to  Procedure(s): ESOPHAGOGASTRODUODENOSCOPY (EGD) WITH PROPOFOL (N/A) COLONOSCOPY WITH PROPOFOL (N/A) as a surgical intervention.  The patient's history has been reviewed, patient examined, no change in status, stable for surgery.  I have reviewed the patient's chart and labs.  Questions were answered to the patient's satisfaction.     Pricilla Riffle. [place] Plan

## 2019-06-15 NOTE — Progress Notes (Signed)
PT Cancellation Note  Patient Details Name: Daisy Parker MRN: [id number] DOB: 03/07/1941   Cancelled Treatment:    Reason Eval/Treat Not Completed: Fatigue/lethargy limiting ability to participate - pt sleeping soundly after EGD, pt's daughter defers until tomorrow. Will check back then.  [place] Pager [phone]  Office [phone]    [place] 06/15/2019, 1:23 PM

## 2019-06-15 NOTE — Progress Notes (Addendum)
PROGRESS NOTE    Daisy Parker    Code Status: Full Code  UXL:[id number] DOB: [birth year] DOA: 06/13/2019 LOS: 2 days  PCP: Patient, No Pcp Per CC:  Chief Complaint  Patient presents with  . Anemia       Hospital Summary   This is a 79 year old female with history of severe dementia, atrial fibrillation on Eliquis, PPM, CKD 4, recently treated for COVID-19 last month who presented from SNF for anemia found to have Hb 6.6 at facility without associated melena, hematochezia, hematemesis and sent to [facility] ED on 3/4. Found to have Hemoccult positive on presentation and received 2 unit PRBC transfusion. GI was consulted.  3/6: Hb stable, EGD, poor prep-no colonoscopy.  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 Acute blood loss anemia due to GI bleed in setting of recently starting Eliquis for atrial fibrillation, status post 2 unit PRBC transfusion a. Hb stable b. 3/6: Poor prep-no colonoscopy; EGD: 3 nonbleeding duodenal ulcers with clean ulcer bases (biopsies obtained), nonbleeding duodenal diverticulum, duodenitis, benign esophageal stenosis. c. Per GI: No NSAIDs, Protonix 40 mg p.o. twice daily x4 weeks then daily, await path results, colonoscopy if rebleeding, hold Eliquis for at least 7 days 2. Severe dementia a. Continue Zyprexa, Depakote, Zoloft 3. CKD 4, at baseline 4. Hypertension a. Continue Coreg and amlodipine 5. Atrial fibrillation, rate controlled a. Continue Coreg b. CHA2DS2-VASc of 4 c. Hold Eliquis as above 6. Osteoarthritis continue Voltaren gel  DVT prophylaxis: Contraindicated Family Communication: Patient's daughter has been updated at bedside Disposition Plan:   Patient came from:   SNF                                                                                           Anticipated d/c place: TBD  Barriers to d/c: Monitor Hb overnight, if no concerns for rebleeding can likely discharge tomorrow from medical standpoint.  PT eval bending, TOC consult to help with placement  Pressure injury documentation    None  Consultants  GI  Procedures  None  Antibiotics   Anti-infectives (From admission, onward)   None        Subjective   Patient and daughter present in room.  Patient is sleeping post EGD.  Daughter states patient did well today without any recurrence of bleed, unfortunately was unable to tolerate complete bowel regimen overnight.  No other issues.  Patient arousable easily and denies any complaints, she is in a pleasant mood today.  Complains of chronic joint pain for which she uses Voltaren gel and is on order.  Objective   Vitals:   06/15/19 0900 06/15/19 0915 06/15/19 0930 06/15/19 0952  BP: 134/83 118/81 123/76 138/72  Pulse: 67 70 70 68  Resp: 18 (!) 22 14 14   Temp:   97.9 F (36.6 C)   TempSrc:  SpO2: 92% 93% 96% 95%  Weight:      Height:        Intake/Output Summary (Last 24 hours) at 06/15/2019 1537 Last data filed at 06/15/2019 1400 Gross per 24 hour  Intake 1510.66 ml  Output 0 ml  Net 1510.66 ml   Filed Weights   06/15/19 0701  Weight: 74 kg    Examination: Adamantly refusing exam Physical Exam Vitals and nursing note reviewed.  Constitutional:      Appearance: Normal appearance.  HENT:     Head: Normocephalic and atraumatic.  Eyes:     Conjunctiva/sclera: Conjunctivae normal.  Cardiovascular:     Rate and Rhythm: Normal rate and regular rhythm.  Pulmonary:     Effort: Pulmonary effort is normal.     Breath sounds: Normal breath sounds.  Abdominal:     General: Abdomen is flat.     Palpations: Abdomen is soft.  Musculoskeletal:        General: No swelling or tenderness.  Skin:    Coloration: Skin is not jaundiced or pale.  Neurological:      Mental Status: She is alert. Mental status is at baseline. She is disoriented.  Psychiatric:        Mood and Affect: Mood normal.        Behavior: Behavior normal.     Data Reviewed: I have personally reviewed following labs and imaging studies  CBC: Recent Labs  Lab 06/13/19 1815 06/14/19 1451 06/14/19 2034 06/15/19 0447  WBC 4.8 5.9  --  5.5  NEUTROABS 3.2  --   --   --   HGB 6.8* 8.7*  8.8* 8.7* 8.4*  HCT 21.5* 27.3*  27.2* 27.5* 26.7*  MCV 96.4 89.8  --  90.2  PLT 200 196  --  371   Basic Metabolic Panel: Recent Labs  Lab 06/13/19 1815 06/14/19 1451 06/15/19 0447  NA 134* 136 137  K 5.1 4.9 4.9  CL 103 106 108  CO2 22 21* 22  GLUCOSE 205* 153* 107*  BUN 74* 70* 63*  CREATININE 2.38* 2.25* 2.30*  CALCIUM 9.0 8.9 8.8*   GFR: Estimated Creatinine Clearance: 20.4 mL/min (A) (by C-G formula based on SCr of 2.3 mg/dL (H)). Liver Function Tests: Recent Labs  Lab 06/13/19 1815  AST 21  ALT 31  ALKPHOS 78  BILITOT 0.5  PROT 5.5*  ALBUMIN 2.3*   No results for input(s): LIPASE, AMYLASE in the last 168 hours. No results for input(s): AMMONIA in the last 168 hours. Coagulation Profile: Recent Labs  Lab 06/13/19 1815  INR 1.4*   Cardiac Enzymes: No results for input(s): CKTOTAL, CKMB, CKMBINDEX, TROPONINI in the last 168 hours. BNP (last 3 results) No results for input(s): PROBNP in the last 8760 hours. HbA1C: No results for input(s): HGBA1C in the last 72 hours. CBG: No results for input(s): GLUCAP in the last 168 hours. Lipid Profile: No results for input(s): CHOL, HDL, LDLCALC, TRIG, CHOLHDL, LDLDIRECT in the last 72 hours. Thyroid Function Tests: No results for input(s): TSH, T4TOTAL, FREET4, T3FREE, THYROIDAB in the last 72 hours. Anemia Panel: Recent Labs    06/13/19 2014 06/14/19 1451  VITAMINB12  --  929*  FOLATE  --  19.6  FERRITIN  --  298  TIBC  --  333  IRON  --  182*  RETICCTPCT 3.5*  --    Sepsis Labs: No results for input(s):  PROCALCITON, LATICACIDVEN in the last 168 hours.  No results found for this or  any previous visit (from the past 240 hour(s)).       Radiology Studies: CUP PACEART INCLINIC DEVICE CHECK  Result Date: 06/13/2019 Device checked in clinic by industry. See scanned report. Next home remote 09/10/19. ROV w/ WC in 9 months.Lavenia Atlas, BSN, RN       Scheduled Meds: . acetaminophen  1,000 mg Oral TID  . allopurinol  100 mg Oral Daily  . amLODipine  10 mg Oral QHS  . brimonidine  1 drop Both Eyes BID  . carvedilol  6.25 mg Oral BID WC  . cholecalciferol  2,000 Units Oral Daily  . diclofenac Sodium  2 g Topical TID  . divalproex  125 mg Oral BID  . fluticasone  1 spray Each Nare Daily  . latanoprost  1 drop Both Eyes QHS  . levothyroxine  150 mcg Oral QAC breakfast  . multivitamins with iron  1 tablet Oral Daily  . OLANZapine  7.5 mg Oral QHS  . pantoprazole (PROTONIX) IV  40 mg Intravenous Q12H  . polyethylene glycol  17 g Oral Daily  . polyethylene glycol  17 g Oral Once  . sertraline  25 mg Oral Daily  . sodium bicarbonate  650 mg Oral BID   Continuous Infusions:    Time spent: 20 minutes with over 50% of the time coordinating the patient's care    Harold Hedge, DO [facility] Pager [phone]  Call night coverage person covering after 7pm

## 2019-06-15 NOTE — Anesthesia Postprocedure Evaluation (Signed)
Anesthesia Post Note  Patient: Daisy Parker  Procedure(s) Performed: ESOPHAGOGASTRODUODENOSCOPY (EGD) WITH PROPOFOL (N/A ) BIOPSY     Patient location during evaluation: PACU Anesthesia Type: General Level of consciousness: awake and alert Pain management: pain level controlled Vital Signs Assessment: post-procedure vital signs reviewed and stable Respiratory status: spontaneous breathing, nonlabored ventilation, respiratory function stable and patient connected to nasal cannula oxygen Cardiovascular status: stable and blood pressure returned to baseline Postop Assessment: no apparent nausea or vomiting Anesthetic complications: no    Last Vitals:  Vitals:   06/15/19 0930 06/15/19 0952  BP: 123/76 138/72  Pulse: 70 68  Resp: 14 14  Temp: 36.6 C   SpO2: 96% 95%    Last Pain:  Vitals:   06/15/19 1132  TempSrc:   PainSc: 0-No pain                 Alyzah Pelly DAVID

## 2019-06-15 NOTE — Anesthesia Preprocedure Evaluation (Addendum)
Anesthesia Evaluation  Patient identified by MRN, date of birth, ID band Patient awake    Reviewed: Allergy & Precautions, NPO status , Patient's Chart, lab work & pertinent test results  Airway Mallampati: I  TM Distance: >3 FB Neck ROM: Full    Dental   Pulmonary former smoker,    Pulmonary exam normal        Cardiovascular hypertension, Pt. on medications Normal cardiovascular exam+ dysrhythmias + pacemaker      Neuro/Psych Dementia    GI/Hepatic   Endo/Other    Renal/GU Renal InsufficiencyRenal disease     Musculoskeletal   Abdominal   Peds  Hematology   Anesthesia Other Findings   Reproductive/Obstetrics                            Anesthesia Physical Anesthesia Plan  ASA: III  Anesthesia Plan: General   Post-op Pain Management:    Induction: Intravenous  PONV Risk Score and Plan: 3 and Ondansetron and Treatment may vary due to age or medical condition  Airway Management Planned: Oral ETT  Additional Equipment:   Intra-op Plan:   Post-operative Plan: Extubation in OR  Informed Consent: I have reviewed the patients History and Physical, chart, labs and discussed the procedure including the risks, benefits and alternatives for the proposed anesthesia with the patient or authorized representative who has indicated his/her understanding and acceptance.       Plan Discussed with: CRNA and Surgeon  Anesthesia Plan Comments:         Anesthesia Quick Evaluation

## 2019-06-15 NOTE — Op Note (Addendum)
[facility] Patient Name: Daisy Parker Procedure Date: 06/15/2019 MRN: [id number] Attending MD: Ladene Artist , MD Date of Birth: 1941/01/08 CSN: [id number] Age: 79 Admit Type: Inpatient Procedure:                Upper GI endoscopy Indications:              Chronic blood loss, worsening anemia, Heme positive                            stool Providers:                Pricilla Riffle. Fuller Plan, MD, Burtis Junes, RN, Janeece Agee,                            Technician Referring MD:             [facility] Medicines:                Monitored Anesthesia Care Complications:            No immediate complications. Estimated Blood Loss:     Estimated blood loss was minimal. Procedure:                Pre-Anesthesia Assessment:                           - Prior to the procedure, a History and Physical                            was performed, and patient medications and                            allergies were reviewed. The patient's tolerance of                            previous anesthesia was also reviewed. The risks                            and benefits of the procedure and the sedation                            options and risks were discussed with the patient.                            All questions were answered, and informed consent                            was obtained. Prior Anticoagulants: The patient has                            taken Eliquis (apixaban), last dose was 2 days                            prior to procedure. ASA Grade Assessment: III - A  patient with severe systemic disease. After                            reviewing the risks and benefits, the patient was                            deemed in satisfactory condition to undergo the                            procedure.                           After obtaining informed consent, the endoscope was                            passed under direct vision. Throughout the      procedure, the patient's blood pressure, pulse, and                            oxygen saturations were monitored continuously. The                            GIF-H190 ([id number]) Olympus gastroscope was                            introduced through the mouth, and advanced to the                            second part of duodenum. The upper GI endoscopy was                            accomplished without difficulty. The patient                            tolerated the procedure well. Scope In: Scope Out: Findings:      One benign-appearing, intrinsic mild stenosis was found at the       gastroesophageal junction. This stenosis measured 1.4 cm (inner       diameter) x less than one cm (in length). The stenosis was traversed.      The exam of the esophagus was otherwise normal.      A small hiatal hernia was present.      The exam of the stomach was otherwise normal.      Three non-bleeding cratered duodenal ulcers with a clean ulcer base       (Forrest Class III) were found in the second portion of the duodenum       just beyond the bulb. The largest lesion was 8 mm in largest dimension.       Gastric biopsies were taken with a cold forceps for histology.      A 12 mm non-bleeding diverticulum was found in the area of the papilla.      Diffuse moderate inflammation characterized by erythema and granularity       was found in the second portion of the duodenum. Impression:               - Benign-appearing esophageal  stenosis.                           - Small hiatal hernia.                           - Three non-bleeding duodenal ulcers with clean                            ulcer bases (Forrest Class III). Gastric biopsies.                           - Non-bleeding duodenal diverticulum.                           - Duodenitis. Moderate Sedation:      Not Applicable - Patient had care per Anesthesia. Recommendation:           - Return patient to hospital ward for ongoing care.                            - Resume previous diet.                           - Continue present medications.                           - No aspirin, ibuprofen, naproxen, or other                            non-steroidal anti-inflammatory drugs long term.                           - Protonix (pantoprazole) 40 mg PO BID for 4 weeks,                            then pantoprazole 40 mg po qd long term.                           - Await pathology results.                           - Pt was not able to fully prep for colonoscopy.                            Discussed with her daughter and shared decision was                            to defer colonosocpy given findings on EGD. If she                            has recurrent bleeding can reasess proceeding with                            colonoscopy in the future.                           -  Resume Eliquis (apixaban) at prior dose no sooner                            than 7 days. Refer to managing physician for                            further adjustment of therapy.                           - GI signing off, available if needed. Procedure Code(s):        --- Professional ---                           [phone], Esophagogastroduodenoscopy, flexible,                            transoral; with biopsy, single or multiple Diagnosis Code(s):        --- Professional ---                           K22.2, Esophageal obstruction                           K44.9, Diaphragmatic hernia without obstruction or                            gangrene                           K26.9, Duodenal ulcer, unspecified as acute or                            chronic, without hemorrhage or perforation                           K29.80, Duodenitis without bleeding                           D50.0, Iron deficiency anemia secondary to blood                            loss (chronic)                           R19.5, Other fecal abnormalities                           K57.10, Diverticulosis of small  intestine without                            perforation or abscess without bleeding CPT copyright 2019 [employer]. All rights reserved. The codes documented in this report are preliminary and upon coder review may  be revised to meet current compliance requirements. Ladene Artist, MD 06/15/2019 8:57:57 AM This report has been signed electronically. Number of Addenda: 0

## 2019-06-15 NOTE — Transfer of Care (Signed)
Immediate Anesthesia Transfer of Care Note  Patient: Daisy Parker  Procedure(s) Performed: ESOPHAGOGASTRODUODENOSCOPY (EGD) WITH PROPOFOL (N/A ) BIOPSY  Patient Location: PACU  Anesthesia Type:MAC  Level of Consciousness: awake, drowsy and responds to stimulation  Airway & Oxygen Therapy: Patient Spontanous Breathing and Patient connected to nasal cannula oxygen  Post-op Assessment: Report given to RN and Post -op Vital signs reviewed and stable  Post vital signs: Reviewed and stable  Last Vitals:  Vitals Value Taken Time  BP    Temp    Pulse 72 06/15/19 0849  Resp 18 06/15/19 0849  SpO2 92 % 06/15/19 0849  Vitals shown include unvalidated device data.  Last Pain:  Vitals:   06/15/19 0752  TempSrc: Temporal  PainSc: 0-No pain         Complications: No apparent anesthesia complications

## 2019-06-15 NOTE — Progress Notes (Signed)
Incontinent of stool and urine upon arrival from Endoscopy Lab. Turned and cleaned with clean gown and pads.

## 2019-06-16 LAB — CBC
HCT: 27 % — ABNORMAL LOW (ref 36.0–46.0)
Hemoglobin: 8.6 g/dL — ABNORMAL LOW (ref 12.0–15.0)
MCH: 29.1 pg (ref 26.0–34.0)
MCHC: 31.9 g/dL (ref 30.0–36.0)
MCV: 91.2 fL (ref 80.0–100.0)
Platelets: 190 10*3/uL (ref 150–400)
RBC: 2.96 MIL/uL — ABNORMAL LOW (ref 3.87–5.11)
RDW: 21.2 % — ABNORMAL HIGH (ref 11.5–15.5)
WBC: 5.1 10*3/uL (ref 4.0–10.5)
nRBC: 0 % (ref 0.0–0.2)

## 2019-06-16 LAB — BASIC METABOLIC PANEL
Anion gap: 7 (ref 5–15)
BUN: 59 mg/dL — ABNORMAL HIGH (ref 8–23)
CO2: 21 mmol/L — ABNORMAL LOW (ref 22–32)
Calcium: 8.6 mg/dL — ABNORMAL LOW (ref 8.9–10.3)
Chloride: 106 mmol/L (ref 98–111)
Creatinine, Ser: 2.48 mg/dL — ABNORMAL HIGH (ref 0.44–1.00)
GFR calc Af Amer: 21 mL/min — ABNORMAL LOW (ref >60)
GFR calc non Af Amer: 18 mL/min — ABNORMAL LOW (ref >60)
Glucose, Bld: 126 mg/dL — ABNORMAL HIGH (ref 70–99)
Potassium: 4.8 mmol/L (ref 3.5–5.1)
Sodium: 134 mmol/L — ABNORMAL LOW (ref 135–145)

## 2019-06-16 NOTE — Evaluation (Signed)
Physical Therapy Evaluation Patient Details Name: Daisy Parker MRN: [id number] DOB: Apr 21, 1940 Today's Date: 06/16/2019   History of Present Illness  79 year old female with history of severe dementia, atrial fibrillation on Eliquis, PPM, CKD 4, recently treated for COVID-19 last month who presented from ALF d/t anemia  and was found to have Hb 6.6 at facility without melena, sent to  [facility] ED on 3/4,  GI was consulted.  Clinical Impression  Pt admitted with above diagnosis. Pt was a facility ambulator until multiple recent  Medical issues. Pt is now requiring +2 assist for bed mobility and partial sit to stand transfers. She is unable to transfer to chair or amb at this time d/t global weakness/deconditioning.  Recommend SNF unless [facility] can provide +2 assist   Pt currently with functional limitations due to the deficits listed below (see PT Problem List). Pt will benefit from skilled PT to increase their independence and safety with mobility to allow discharge to the venue listed below.       Follow Up Recommendations SNF    Equipment Recommendations  None recommended by PT    Recommendations for Other Services       Precautions / Restrictions Precautions Precautions: Fall Restrictions Weight Bearing Restrictions: No      Mobility  Bed Mobility Overal bed mobility: Needs Assistance Bed Mobility: Supine to Sit;Sit to Supine     Supine to sit: Total assist;+2 for physical assistance;+2 for safety/equipment;Max assist Sit to supine: Total assist;+2 for physical assistance;+2 for safety/equipment;Max assist   General bed mobility comments: pt requiring multi-modal cues for participation as well as +2 assist for trunk and LEs for both transitions  Transfers Overall transfer level: Needs assistance Equipment used: Rolling walker (2 wheeled) Transfers: Sit to/from Stand Sit to Stand: Max assist;Total assist;+2 physical assistance;+2 safety/equipment          General transfer comment: Pt able to partially stand with +2 assist. pt with limited participation and requiring multi-modal cues and encouragement to self assist.( pt trying to push herself back  into bed)  Ambulation/Gait             General Gait Details: unable  Stairs            Wheelchair Mobility    Modified Rankin (Stroke Patients Only)       Balance Overall balance assessment: Needs assistance Sitting-balance support: Single extremity supported;Feet supported Sitting balance-Leahy Scale: Fair Sitting balance - Comments: after assisted to midline pt able to briefly maintain static with close supervision Postural control: Posterior lean   Standing balance-Leahy Scale: Zero                               Pertinent Vitals/Pain Pain Assessment: Faces Faces Pain Scale: Hurts little more Pain Location: generalized Pain Descriptors / Indicators: Grimacing Pain Intervention(s): Limited activity within patient's tolerance;Monitored during session;Repositioned    Home Living Family/patient expects to be discharged to:: Unsure                 Additional Comments: Per daughter, pt from memory care ALF at [facility].    Prior Function Level of Independence: Needs assistance   Gait / Transfers Assistance Needed: pt ambulates with RW and supervision per dtr. Pt has had recent decline in fucntional status over the last few mos per pt dtr. dtr states it is difficult to know exactly what pt has been doign d/t being unable to visit  ALF during the pandemic           Hand Dominance        Extremity/Trunk Assessment   Upper Extremity Assessment Upper Extremity Assessment: Generalized weakness    Lower Extremity Assessment Lower Extremity Assessment: Generalized weakness    Cervical / Trunk Assessment Cervical / Trunk Assessment: Kyphotic  Communication   Communication: HOH  Cognition Arousal/Alertness: Awake/alert Behavior During  Therapy: (easily agitated) Overall Cognitive Status: History of cognitive impairments - at baseline                                        General Comments      Exercises     Assessment/Plan    PT Assessment Patient needs continued PT services  PT Problem List Decreased strength;Decreased range of motion;Decreased activity tolerance;Decreased balance;Decreased knowledge of use of DME;Decreased cognition;Decreased mobility       PT Treatment Interventions DME instruction;Therapeutic exercise;Functional mobility training;Therapeutic activities;Patient/family education;Balance training;Gait training    PT Goals (Current goals can be found in the Care Plan section)  Acute Rehab PT Goals Patient Stated Goal: per dtr--for pt to not be bed bound, be able to  walk PT Goal Formulation: With patient Time For Goal Achievement: 06/30/19 Potential to Achieve Goals: Poor    Frequency Min 2X/week   Barriers to discharge        Co-evaluation               AM-PAC PT "6 Clicks" Mobility  Outcome Measure Help needed turning from your back to your side while in a flat bed without using bedrails?: Total Help needed moving from lying on your back to sitting on the side of a flat bed without using bedrails?: Total Help needed moving to and from a bed to a chair (including a wheelchair)?: Total Help needed standing up from a chair using your arms (e.g., wheelchair or bedside chair)?: Total Help needed to walk in hospital room?: Total Help needed climbing 3-5 steps with a railing? : Total 6 Click Score: 6    End of Session Equipment Utilized During Treatment: Gait belt Activity Tolerance: Patient limited by fatigue;Other (comment)(limited by cognition) Patient left: in bed;with call bell/phone within reach;with bed alarm set;with family/visitor present Nurse Communication: Mobility status PT Visit Diagnosis: Muscle weakness (generalized) (M62.81);Other abnormalities of  gait and mobility (R26.89)    Time: 0141-0301 PT Time Calculation (min) (ACUTE ONLY): 21 min   Charges:   PT Evaluation $PT Eval Moderate Complexity: 1 Mod          Mickey Esguerra, PT   Acute Rehab Dept [facility]): [phone]   06/16/2019   [facility] 06/16/2019, 3:51 PM

## 2019-06-16 NOTE — Progress Notes (Signed)
PROGRESS NOTE    Daisy Parker    Code Status: Full Code  YSA:[id number] DOB: [birth year] DOA: 06/13/2019 LOS: 3 days  PCP: Patient, No Pcp Per CC:  Chief Complaint  Patient presents with  . Anemia       Hospital Summary   This is a 79 year old female with history of severe dementia, atrial fibrillation on Eliquis, PPM, CKD 4, recently treated for COVID-19 last month who presented from SNF for anemia found to have Hb 6.6 at facility without associated melena, hematochezia, hematemesis and sent to [facility] ED on 3/4. Found to have Hemoccult positive on presentation and received 2 unit PRBC transfusion. GI was consulted.  3/6: Hb stable, EGD, poor prep-no colonoscopy.  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 Acute blood loss anemia due to GI bleed in setting of recently starting Eliquis for atrial fibrillation, status post 2 unit PRBC transfusion a. Hb stable b. 3/6: Poor prep-no colonoscopy; EGD: 3 nonbleeding duodenal ulcers with clean ulcer bases (biopsies obtained), nonbleeding duodenal diverticulum, duodenitis, benign esophageal stenosis. c. Per GI: No NSAIDs, Protonix 40 mg p.o. twice daily x4 weeks then daily, await path results, colonoscopy if rebleeding, hold Eliquis for at least 7 days 2. Severe dementia a. Continue Zyprexa, Depakote, Zoloft 3. CKD 4, at baseline 4. Hypertension a. Continue Coreg and amlodipine 5. Atrial fibrillation, rate controlled a. Continue Coreg b. CHA2DS2-VASc of 4 c. Has bled: 3 high risk d. Hold Eliquis as above e. Recommend outpatient follow-up with cardiology in the next week to discuss anticoagulation 6. Osteoarthritis continue Voltaren gel  DVT prophylaxis: SCDs Family Communication: Patient's daughter has  been updated at bedside Disposition Plan:   Patient came from:   SNF                                                                                          Anticipated d/c place: TBD  Barriers to d/c: Medically stable for discharge, pending PT eval  Pressure injury documentation    None  Consultants  GI  Procedures  None  Antibiotics   Anti-infectives (From admission, onward)   None        Subjective   Seen and examined with family at bedside.  Patient currently breakfast.  Daughter notes no issues overnight.  Patient denies any complaints currently.  Objective   Vitals:   06/15/19 0952 06/15/19 2103 06/16/19 0535 06/16/19 1228  BP: 138/72 118/70 131/79 121/68  Pulse: 68 70 70 70  Resp: 14 16 16 14   Temp:  97.6 F (36.4 C) (!) 97.5 F (36.4 C) 98.2 F (36.8 C)  TempSrc:  Oral Oral Oral  SpO2: 95% 93% 93% 94%  Weight:      Height:        Intake/Output Summary (Last 24 hours) at 06/16/2019 1354 Last data filed at 06/16/2019 0900 Gross  per 24 hour  Intake 440 ml  Output --  Net 440 ml   Filed Weights   06/15/19 0701  Weight: 74 kg    Examination: Adamantly refusing exam Physical Exam Vitals and nursing note reviewed.  Constitutional:      General: She is not in acute distress. HENT:     Head: Normocephalic.  Cardiovascular:     Rate and Rhythm: Normal rate and regular rhythm.  Pulmonary:     Effort: Pulmonary effort is normal.  Abdominal:     General: Bowel sounds are normal.  Musculoskeletal:        General: No swelling or tenderness.  Neurological:     Mental Status: She is alert. Mental status is at baseline.  Psychiatric:        Mood and Affect: Mood normal.        Behavior: Behavior normal.     Data Reviewed: I have personally reviewed following labs and imaging studies  CBC: Recent Labs  Lab 06/13/19 1815 06/14/19 1451 06/14/19 2034 06/15/19 0447 06/16/19 0510  WBC 4.8 5.9  --  5.5 5.1  NEUTROABS 3.2  --   --   --   --    HGB 6.8* 8.7*  8.8* 8.7* 8.4* 8.6*  HCT 21.5* 27.3*  27.2* 27.5* 26.7* 27.0*  MCV 96.4 89.8  --  90.2 91.2  PLT 200 196  --  189 250   Basic Metabolic Panel: Recent Labs  Lab 06/13/19 1815 06/14/19 1451 06/15/19 0447 06/16/19 0510  NA 134* 136 137 134*  K 5.1 4.9 4.9 4.8  CL 103 106 108 106  CO2 22 21* 22 21*  GLUCOSE 205* 153* 107* 126*  BUN 74* 70* 63* 59*  CREATININE 2.38* 2.25* 2.30* 2.48*  CALCIUM 9.0 8.9 8.8* 8.6*   GFR: Estimated Creatinine Clearance: 18.9 mL/min (A) (by C-G formula based on SCr of 2.48 mg/dL (H)). Liver Function Tests: Recent Labs  Lab 06/13/19 1815  AST 21  ALT 31  ALKPHOS 78  BILITOT 0.5  PROT 5.5*  ALBUMIN 2.3*   No results for input(s): LIPASE, AMYLASE in the last 168 hours. No results for input(s): AMMONIA in the last 168 hours. Coagulation Profile: Recent Labs  Lab 06/13/19 1815  INR 1.4*   Cardiac Enzymes: No results for input(s): CKTOTAL, CKMB, CKMBINDEX, TROPONINI in the last 168 hours. BNP (last 3 results) No results for input(s): PROBNP in the last 8760 hours. HbA1C: No results for input(s): HGBA1C in the last 72 hours. CBG: No results for input(s): GLUCAP in the last 168 hours. Lipid Profile: No results for input(s): CHOL, HDL, LDLCALC, TRIG, CHOLHDL, LDLDIRECT in the last 72 hours. Thyroid Function Tests: No results for input(s): TSH, T4TOTAL, FREET4, T3FREE, THYROIDAB in the last 72 hours. Anemia Panel: Recent Labs    06/13/19 2014 06/14/19 1451  VITAMINB12  --  929*  FOLATE  --  19.6  FERRITIN  --  298  TIBC  --  333  IRON  --  182*  RETICCTPCT 3.5*  --    Sepsis Labs: No results for input(s): PROCALCITON, LATICACIDVEN in the last 168 hours.  No results found for this or any previous visit (from the past 240 hour(s)).       Radiology Studies: No results found.      Scheduled Meds: . acetaminophen  1,000 mg Oral TID  . allopurinol  100 mg Oral Daily  . amLODipine  10 mg Oral QHS  .  brimonidine  1 drop  Both Eyes BID  . carvedilol  6.25 mg Oral BID WC  . cholecalciferol  2,000 Units Oral Daily  . diclofenac Sodium  2 g Topical TID  . divalproex  125 mg Oral BID  . fluticasone  1 spray Each Nare Daily  . latanoprost  1 drop Both Eyes QHS  . levothyroxine  150 mcg Oral QAC breakfast  . multivitamins with iron  1 tablet Oral Daily  . OLANZapine  7.5 mg Oral QHS  . pantoprazole (PROTONIX) IV  40 mg Intravenous Q12H  . polyethylene glycol  17 g Oral Daily  . polyethylene glycol  17 g Oral Once  . sertraline  25 mg Oral Daily  . sodium bicarbonate  650 mg Oral BID   Continuous Infusions:    Time spent: 16 minutes with over 50% of the time coordinating the patient's care    Harold Hedge, DO [facility] Pager [phone]  Call night coverage person covering after 7pm

## 2019-06-17 ENCOUNTER — Encounter: Payer: Self-pay | Admitting: *Deleted

## 2019-06-17 LAB — SARS CORONAVIRUS 2 (TAT 6-24 HRS): SARS Coronavirus 2: POSITIVE — AB

## 2019-06-17 NOTE — TOC Progression Note (Signed)
Transition of Care [facility]) - Progression Note    Patient Details  Name: Daisy Parker MRN: [id number] Date of Birth: 05-30-1940  Transition of Care [facility]) CM/SW Contact  Laya Letendre, Juliann Pulse, RN Phone Number: 06/17/2019, 4:28 PM  Clinical Narrative: chose [facility] for SNf,awaiting covid results.      Expected Discharge Plan: [place] Barriers to Discharge: Continued Medical Work up  Expected Discharge Plan and Services Expected Discharge Plan: [place]   Discharge Planning Services: CM Consult   Living arrangements for the past 2 months: Assisted Living Facility(Memory care)                                       Social Determinants of Health (SDOH) Interventions    Readmission Risk Interventions No flowsheet data found.

## 2019-06-17 NOTE — TOC Progression Note (Signed)
Transition of Care [facility]) - Progression Note    Patient Details  Name: Daisy Parker MRN: [id number] Date of Birth: 1940/07/13  Transition of Care [facility]) CM/SW Contact  Trenese Haft, Juliann Pulse, RN Phone Number: 06/17/2019, 1:51 PM  Clinical Narrative:  Bed offers given to dtr [facility] awaiting choice.     Expected Discharge Plan: Memory Care Barriers to Discharge: Continued Medical Work up  Expected Discharge Plan and Services Expected Discharge Plan: Memory Care   Discharge Planning Services: CM Consult   Living arrangements for the past 2 months: Assisted Living Facility(Memory care)                                       Social Determinants of Health (SDOH) Interventions    Readmission Risk Interventions No flowsheet data found.

## 2019-06-17 NOTE — NC FL2 (Signed)
[place] LEVEL OF CARE SCREENING TOOL     IDENTIFICATION  Patient Name: Daisy Parker Birthdate: October 20, 1940 Sex: female Admission Date (Current Location): 06/13/2019  [facility] and [place] Number:  Herbalist and Address:  [facility],  [place], [place]      Provider Number: [id number]  Attending Physician Name and Address:  Harold Hedge, MD  Relative Name and Phone Number:  Jaidy Cottam dtr [phone]    Current Level of Care: Hospital Recommended Level of Care: [place] Prior Approval Number:    Date Approved/Denied:   PASRR Number: [id number]  Discharge Plan: SNF    Current Diagnoses: Patient Active Problem List   Diagnosis Date Noted  . Symptomatic anemia   . Heme positive stool   . Multiple duodenal ulcers   . Acute blood loss anemia 06/13/2019  . A-fib ([place]) 06/13/2019  . GI bleed 06/13/2019  . Acute lower UTI 05/17/2019  . Pneumonia due to COVID-19 virus 05/15/2019  . COVID-19 virus infection 05/15/2019  . Urinary frequency 03/13/2019  . Palliative care by specialist   . DNR (do not resuscitate) discussion   . Bradycardia   . Mitral valve mass   . Second degree AV block, Mobitz type I   . Second degree AV block, Mobitz type II   . RBBB   . Syncope 03/08/2019  . AKI (acute kidney injury) ([place]) 05/16/2018  . Encephalopathy acute 05/14/2018  . Dementia ([place]) 05/14/2018  . HTN (hypertension) 05/14/2018  . 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/min (HCC) 05/14/2018    Orientation RESPIRATION BLADDER Height & Weight     Self  Normal Incontinent Weight: 74 kg Height:  5\' 6"  (167.6 cm)  BEHAVIORAL SYMPTOMS/MOOD NEUROLOGICAL BOWEL NUTRITION STATUS      Incontinent Diet(Regular)  AMBULATORY STATUS COMMUNICATION OF NEEDS Skin   Limited Assist Verbally Normal                       Personal Care Assistance Level of Assistance  Bathing, Feeding, Dressing Bathing  Assistance: Limited assistance Feeding assistance: Limited assistance Dressing Assistance: Limited assistance     Functional Limitations Info  Sight, Hearing, Speech Sight Info: Impaired(eyeglasses) Hearing Info: Adequate Speech Info: Adequate    SPECIAL CARE FACTORS FREQUENCY  PT (By licensed PT), OT (By licensed OT)     PT Frequency: 5x week OT Frequency: 5x week            Contractures Contractures Info: Not present    Additional Factors Info  Psychotropic Code Status Info: Full code Allergies Info: Codeine, sulfa antibiotics Psychotropic Info: zoloft 25mg  po qd,depakote 125mg  po bid, zyprexa 7.5mg  po HS         Current Medications (06/17/2019):  This is the current hospital active medication list Current Facility-Administered Medications  Medication Dose Route Frequency Provider Last Rate Last Admin  . acetaminophen (TYLENOL) tablet 650 mg  650 mg Oral Q6H PRN Etta Quill, DO       Or  . acetaminophen (TYLENOL) suppository 650 mg  650 mg Rectal Q6H PRN Etta Quill, DO      . acetaminophen (TYLENOL) tablet 1,000 mg  1,000 mg Oral TID Etta Quill, DO   1,000 mg at 06/17/19 1012  . allopurinol (ZYLOPRIM) tablet 100 mg  100 mg Oral Daily Jennette Kettle M, DO   100 mg at 06/17/19 1011  . amLODipine (NORVASC) tablet 10  mg  10 mg Oral QHS Jennette Kettle M, DO   10 mg at 06/16/19 2201  . brimonidine (ALPHAGAN) 0.2 % ophthalmic solution 1 drop  1 drop Both Eyes BID Jennette Kettle M, DO   1 drop at 06/17/19 1012  . carvedilol (COREG) tablet 6.25 mg  6.25 mg Oral BID WC Jennette Kettle M, DO   6.25 mg at 06/17/19 1011  . cholecalciferol (VITAMIN D) tablet 2,000 Units  2,000 Units Oral Daily Etta Quill, DO   2,000 Units at 06/17/19 1011  . diclofenac Sodium (VOLTAREN) 1 % topical gel 2 g  2 g Topical TID Etta Quill, DO   2 g at 06/17/19 1012  . divalproex (DEPAKOTE SPRINKLE) capsule 125 mg  125 mg Oral BID Jennette Kettle M, DO   125 mg at 06/17/19 1012   . fluticasone (FLONASE) 50 MCG/ACT nasal spray 1 spray  1 spray Each Nare Daily Jennette Kettle M, DO   1 spray at 06/17/19 1012  . HYDROmorphone (DILAUDID) injection 0.25-0.5 mg  0.25-0.5 mg Intravenous Q5 min PRN Lillia Abed, MD      . latanoprost (XALATAN) 0.005 % ophthalmic solution 1 drop  1 drop Both Eyes QHS Jennette Kettle M, DO   1 drop at 06/16/19 2200  . levothyroxine (SYNTHROID) tablet 150 mcg  150 mcg Oral QAC breakfast Etta Quill, DO   150 mcg at 06/17/19 0546  . meperidine (DEMEROL) injection 6.25-12.5 mg  6.25-12.5 mg Intravenous Q5 min PRN Lillia Abed, MD      . multivitamins with iron tablet 1 tablet  1 tablet Oral Daily Jennette Kettle M, DO   1 tablet at 06/17/19 1012  . OLANZapine (ZYPREXA) tablet 7.5 mg  7.5 mg Oral QHS Jennette Kettle M, DO   7.5 mg at 06/16/19 2201  . ondansetron (ZOFRAN) tablet 4 mg  4 mg Oral Q6H PRN Etta Quill, DO       Or  . ondansetron [facility]) injection 4 mg  4 mg Intravenous Q6H PRN Etta Quill, DO      . ondansetron [facility]) injection 4 mg  4 mg Intravenous Once PRN Lillia Abed, MD      . pantoprazole (PROTONIX) injection 40 mg  40 mg Intravenous Q12H Ladene Artist, MD   40 mg at 06/17/19 1011  . polyethylene glycol (MIRALAX / GLYCOLAX) packet 17 g  17 g Oral Daily Alcario Drought, Jared M, DO      . polyethylene glycol (MIRALAX / GLYCOLAX) packet 17 g  17 g Oral Once Zehr, Jessica D, PA-C      . sertraline (ZOLOFT) tablet 25 mg  25 mg Oral Daily Jennette Kettle M, DO   25 mg at 06/17/19 1011  . sodium bicarbonate tablet 650 mg  650 mg Oral BID Etta Quill, DO   650 mg at 06/17/19 1011     Discharge Medications: Please see discharge summary for a list of discharge medications.  Relevant Imaging Results:  Relevant Lab Results:   Additional Information ss#[id number]  Kelcie Currie, Juliann Pulse, [place]

## 2019-06-17 NOTE — Progress Notes (Signed)
[facility] lab called to report positive COVID test from earlier today. This testing was pre-screening for discharge to facility. Pt asymptomatic so she doesn't meet criteria for isolation precautions per policy. Communicated with charge nurse and MD made aware via text-page.

## 2019-06-17 NOTE — Progress Notes (Signed)
PROGRESS NOTE    Daisy Parker    Code Status: Full Code  PRF:[id number] DOB: 06-Aug-1940 DOA: 06/13/2019 LOS: 4 days  PCP: Patient, No Pcp Per CC:  Chief Complaint  Patient presents with  . Anemia       Hospital Summary   This is a 79 year old female with history of severe dementia, atrial fibrillation on Eliquis, PPM, CKD 4, recently treated for COVID-19 last month who presented from SNF for anemia found to have Hb 6.6 at facility without associated melena, hematochezia, hematemesis and sent to [facility] ED on 3/4. Found to have Hemoccult positive on presentation and received 2 unit PRBC transfusion. GI was consulted.  3/6: Hb stable, EGD, poor prep-no colonoscopy.  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 Acute blood loss anemia due to GI bleed in setting of recently starting Eliquis for atrial fibrillation, status post 2 unit PRBC transfusion, stable a. 3/6: Poor prep-no colonoscopy; EGD: 3 nonbleeding duodenal ulcers with clean ulcer bases (biopsies obtained), nonbleeding duodenal diverticulum, duodenitis, benign esophageal stenosis. b. Per GI: No NSAIDs, Protonix 40 mg p.o. twice daily x4 weeks then daily, await path results, colonoscopy if rebleeding, hold Eliquis for at least 7 days 2. Severe dementia a. Continue Zyprexa, Depakote, Zoloft 3. CKD 4, at baseline 4. Hypertension a. Continue Coreg and amlodipine 5. Atrial fibrillation, rate controlled a. Continue Coreg b. CHA2DS2-VASc of 4 c. Has bled: 3, high risk. Probably not a good candidate to restart anticoagulation going forward.  d. Hold Eliquis as above e. Recommend outpatient follow-up with cardiology in the next week to discuss anticoagulation 6. Osteoarthritis continue Voltaren  gel  DVT prophylaxis: SCDs Family Communication: Patient's daughter has been updated at bedside today Disposition Plan:   Patient came from:   [place]                                                                                          Anticipated d/c place: SNF  Barriers to d/c: Medically stable for discharge, pending PT eval  Pressure injury documentation    None  Consultants  GI  Procedures  3/6 EGD   Antibiotics   Anti-infectives (From admission, onward)   None        Subjective   Patient sleeping on arrival. Discussed case with daughter at bedside. She is frustrated that her mom may not be able to go back to her previous facility and is awaiting approval for discharge to SNF. Patient resting comfortably, aroused to verbal stimuli but fell back asleep. No issues overnight  Objective   Vitals:   06/16/19 0535 06/16/19 1228 06/16/19 2127 06/17/19 0608  BP: 131/79 121/68 125/85 126/79  Pulse: 70 70 70 68  Resp: 16 14 18 16   Temp: (!) 97.5 F (36.4 C) 98.2 F (36.8 C) 98.1 F (36.7 C) 98 F (36.7 C)  TempSrc: Oral Oral Oral Oral  SpO2: 93% 94% 95% 97%  Weight:      Height:        Intake/Output Summary (Last 24 hours) at 06/17/2019 1332 Last data filed at 06/17/2019 0500 Gross per 24 hour  Intake --  Output 550 ml  Net -550 ml   Filed Weights   06/15/19 0701  Weight: 74 kg     Physical Exam Vitals and nursing note reviewed.  Constitutional:      General: sleeping comfortably HENT:     Head: Normocephalic.  Cardiovascular:     Rate and Rhythm: Normal rate and regular rhythm.  Pulmonary:     Effort: Pulmonary effort is normal.  Abdominal:     General: Bowel sounds are normal.  Musculoskeletal:        General: No swelling or tenderness.  Neurological:     Mental Status:Mental status is at baseline.  Psychiatric:        Mood and Affect: Mood normal.        Behavior: Behavior normal.     Data Reviewed: I have personally reviewed  following labs and imaging studies  CBC: Recent Labs  Lab 06/13/19 1815 06/14/19 1451 06/14/19 2034 06/15/19 0447 06/16/19 0510  WBC 4.8 5.9  --  5.5 5.1  NEUTROABS 3.2  --   --   --   --   HGB 6.8* 8.7*  8.8* 8.7* 8.4* 8.6*  HCT 21.5* 27.3*  27.2* 27.5* 26.7* 27.0*  MCV 96.4 89.8  --  90.2 91.2  PLT 200 196  --  189 962   Basic Metabolic Panel: Recent Labs  Lab 06/13/19 1815 06/14/19 1451 06/15/19 0447 06/16/19 0510  NA 134* 136 137 134*  K 5.1 4.9 4.9 4.8  CL 103 106 108 106  CO2 22 21* 22 21*  GLUCOSE 205* 153* 107* 126*  BUN 74* 70* 63* 59*  CREATININE 2.38* 2.25* 2.30* 2.48*  CALCIUM 9.0 8.9 8.8* 8.6*   GFR: Estimated Creatinine Clearance: 18.9 mL/min (A) (by C-G formula based on SCr of 2.48 mg/dL (H)). Liver Function Tests: Recent Labs  Lab 06/13/19 1815  AST 21  ALT 31  ALKPHOS 78  BILITOT 0.5  PROT 5.5*  ALBUMIN 2.3*   No results for input(s): LIPASE, AMYLASE in the last 168 hours. No results for input(s): AMMONIA in the last 168 hours. Coagulation Profile: Recent Labs  Lab 06/13/19 1815  INR 1.4*   Cardiac Enzymes: No results for input(s): CKTOTAL, CKMB, CKMBINDEX, TROPONINI in the last 168 hours. BNP (last 3 results) No results for input(s): PROBNP in the last 8760 hours. HbA1C: No results for input(s): HGBA1C in the last 72 hours. CBG: No results for input(s): GLUCAP in the last 168 hours. Lipid Profile: No results for input(s): CHOL, HDL, LDLCALC, TRIG, CHOLHDL, LDLDIRECT in the last 72 hours. Thyroid Function Tests: No results for input(s): TSH, T4TOTAL, FREET4, T3FREE, THYROIDAB in the last 72 hours. Anemia Panel: Recent Labs    06/14/19 1451  VITAMINB12 929*  FOLATE 19.6  FERRITIN 298  TIBC 333  IRON 182*   Sepsis Labs: No results for input(s): PROCALCITON, LATICACIDVEN in the last 168 hours.  No results found for this or any previous visit (from the past 240 hour(s)).       Radiology Studies: No results  found.      Scheduled Meds: . acetaminophen  1,000 mg Oral TID  . allopurinol  100 mg Oral Daily  . amLODipine  10 mg Oral  QHS  . brimonidine  1 drop Both Eyes BID  . carvedilol  6.25 mg Oral BID WC  . cholecalciferol  2,000 Units Oral Daily  . diclofenac Sodium  2 g Topical TID  . divalproex  125 mg Oral BID  . fluticasone  1 spray Each Nare Daily  . latanoprost  1 drop Both Eyes QHS  . levothyroxine  150 mcg Oral QAC breakfast  . multivitamins with iron  1 tablet Oral Daily  . OLANZapine  7.5 mg Oral QHS  . pantoprazole (PROTONIX) IV  40 mg Intravenous Q12H  . polyethylene glycol  17 g Oral Daily  . polyethylene glycol  17 g Oral Once  . sertraline  25 mg Oral Daily  . sodium bicarbonate  650 mg Oral BID   Continuous Infusions:    Time spent: 15 minutes with over 50% of the time coordinating the patient's care    Harold Hedge, DO [facility] Pager [phone]  Call night coverage person covering after 7pm

## 2019-06-17 NOTE — NC FL2 (Signed)
[place] LEVEL OF CARE SCREENING TOOL     IDENTIFICATION  Patient Name: Daisy Parker Birthdate: May 11, 1940 Sex: female Admission Date (Current Location): 06/13/2019  [facility] and [place] Number:  Herbalist and Address:  [facility],  [place], [place]      Provider Number: [id number]  Attending Physician Name and Address:  Harold Hedge, MD  Relative Name and Phone Number:  Ruchama Kubicek dtr [phone]    Current Level of Care: Hospital Recommended Level of Care: [place] Prior Approval Number:    Date Approved/Denied:   PASRR Number:    Discharge Plan: SNF    Current Diagnoses: Patient Active Problem List   Diagnosis Date Noted  . Symptomatic anemia   . Heme positive stool   . Multiple duodenal ulcers   . Acute blood loss anemia 06/13/2019  . A-fib ([place]) 06/13/2019  . GI bleed 06/13/2019  . Acute lower UTI 05/17/2019  . Pneumonia due to COVID-19 virus 05/15/2019  . COVID-19 virus infection 05/15/2019  . Urinary frequency 03/13/2019  . Palliative care by specialist   . DNR (do not resuscitate) discussion   . Bradycardia   . Mitral valve mass   . Second degree AV block, Mobitz type I   . Second degree AV block, Mobitz type II   . RBBB   . Syncope 03/08/2019  . AKI (acute kidney injury) ([place]) 05/16/2018  . Encephalopathy acute 05/14/2018  . Dementia ([place]) 05/14/2018  . HTN (hypertension) 05/14/2018  . 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/min (HCC) 05/14/2018    Orientation RESPIRATION BLADDER Height & Weight     Self  Normal Incontinent Weight: 74 kg Height:  5\' 6"  (167.6 cm)  BEHAVIORAL SYMPTOMS/MOOD NEUROLOGICAL BOWEL NUTRITION STATUS      Incontinent Diet(Regular)  AMBULATORY STATUS COMMUNICATION OF NEEDS Skin   Limited Assist Verbally Normal                       Personal Care Assistance Level of Assistance  Bathing, Feeding, Dressing Bathing  Assistance: Limited assistance Feeding assistance: Limited assistance Dressing Assistance: Limited assistance     Functional Limitations Info  Sight, Hearing, Speech Sight Info: Impaired(eyeglasses) Hearing Info: Adequate Speech Info: Adequate    SPECIAL CARE FACTORS FREQUENCY  PT (By licensed PT), OT (By licensed OT)     PT Frequency: 5x week OT Frequency: 5x week            Contractures Contractures Info: Not present    Additional Factors Info  Psychotropic Code Status Info: Full code Allergies Info: Codeine, sulfa antibiotics Psychotropic Info: zoloft 25mg  po qd,depakote 125mg  po bid, zyprexa 7.5mg  po HS         Current Medications (06/17/2019):  This is the current hospital active medication list Current Facility-Administered Medications  Medication Dose Route Frequency Provider Last Rate Last Admin  . acetaminophen (TYLENOL) tablet 650 mg  650 mg Oral Q6H PRN Etta Quill, DO       Or  . acetaminophen (TYLENOL) suppository 650 mg  650 mg Rectal Q6H PRN Etta Quill, DO      . acetaminophen (TYLENOL) tablet 1,000 mg  1,000 mg Oral TID Etta Quill, DO   1,000 mg at 06/17/19 1012  . allopurinol (ZYLOPRIM) tablet 100 mg  100 mg Oral Daily Jennette Kettle M, DO   100 mg at 06/17/19 1011  . amLODipine (NORVASC) tablet  10 mg  10 mg Oral QHS Jennette Kettle M, DO   10 mg at 06/16/19 2201  . brimonidine (ALPHAGAN) 0.2 % ophthalmic solution 1 drop  1 drop Both Eyes BID Jennette Kettle M, DO   1 drop at 06/17/19 1012  . carvedilol (COREG) tablet 6.25 mg  6.25 mg Oral BID WC Jennette Kettle M, DO   6.25 mg at 06/17/19 1011  . cholecalciferol (VITAMIN D) tablet 2,000 Units  2,000 Units Oral Daily Etta Quill, DO   2,000 Units at 06/17/19 1011  . diclofenac Sodium (VOLTAREN) 1 % topical gel 2 g  2 g Topical TID Etta Quill, DO   2 g at 06/17/19 1012  . divalproex (DEPAKOTE SPRINKLE) capsule 125 mg  125 mg Oral BID Jennette Kettle M, DO   125 mg at 06/17/19 1012   . fluticasone (FLONASE) 50 MCG/ACT nasal spray 1 spray  1 spray Each Nare Daily Jennette Kettle M, DO   1 spray at 06/17/19 1012  . HYDROmorphone (DILAUDID) injection 0.25-0.5 mg  0.25-0.5 mg Intravenous Q5 min PRN Lillia Abed, MD      . latanoprost (XALATAN) 0.005 % ophthalmic solution 1 drop  1 drop Both Eyes QHS Jennette Kettle M, DO   1 drop at 06/16/19 2200  . levothyroxine (SYNTHROID) tablet 150 mcg  150 mcg Oral QAC breakfast Etta Quill, DO   150 mcg at 06/17/19 0546  . meperidine (DEMEROL) injection 6.25-12.5 mg  6.25-12.5 mg Intravenous Q5 min PRN Lillia Abed, MD      . multivitamins with iron tablet 1 tablet  1 tablet Oral Daily Jennette Kettle M, DO   1 tablet at 06/17/19 1012  . OLANZapine (ZYPREXA) tablet 7.5 mg  7.5 mg Oral QHS Jennette Kettle M, DO   7.5 mg at 06/16/19 2201  . ondansetron (ZOFRAN) tablet 4 mg  4 mg Oral Q6H PRN Etta Quill, DO       Or  . ondansetron [facility]) injection 4 mg  4 mg Intravenous Q6H PRN Etta Quill, DO      . ondansetron [facility]) injection 4 mg  4 mg Intravenous Once PRN Lillia Abed, MD      . pantoprazole (PROTONIX) injection 40 mg  40 mg Intravenous Q12H Ladene Artist, MD   40 mg at 06/17/19 1011  . polyethylene glycol (MIRALAX / GLYCOLAX) packet 17 g  17 g Oral Daily Alcario Drought, Jared M, DO      . polyethylene glycol (MIRALAX / GLYCOLAX) packet 17 g  17 g Oral Once Zehr, Jessica D, PA-C      . sertraline (ZOLOFT) tablet 25 mg  25 mg Oral Daily Jennette Kettle M, DO   25 mg at 06/17/19 1011  . sodium bicarbonate tablet 650 mg  650 mg Oral BID Etta Quill, DO   650 mg at 06/17/19 1011     Discharge Medications: Please see discharge summary for a list of discharge medications.  Relevant Imaging Results:  Relevant Lab Results:   Additional Information ss#[id number]  Rian Busche, Juliann Pulse, [place]

## 2019-06-17 NOTE — Care Management Important Message (Signed)
Important Message  Patient Details IM Letter given to [name] RN Case Manager to present to the Patient Name: Daisy Parker MRN: [id number] Date of Birth: 1941/01/04   Medicare Important Message Given:  Yes     Kerin Salen 06/17/2019, 12:50 PM

## 2019-06-17 NOTE — Plan of Care (Signed)

## 2019-06-18 ENCOUNTER — Other Ambulatory Visit: Payer: Self-pay

## 2019-06-18 LAB — CBC
HCT: 26.9 % — ABNORMAL LOW (ref 36.0–46.0)
Hemoglobin: 8.3 g/dL — ABNORMAL LOW (ref 12.0–15.0)
MCH: 28.4 pg (ref 26.0–34.0)
MCHC: 30.9 g/dL (ref 30.0–36.0)
MCV: 92.1 fL (ref 80.0–100.0)
Platelets: 207 10*3/uL (ref 150–400)
RBC: 2.92 MIL/uL — ABNORMAL LOW (ref 3.87–5.11)
RDW: 21.9 % — ABNORMAL HIGH (ref 11.5–15.5)
WBC: 6 10*3/uL (ref 4.0–10.5)
nRBC: 0 % (ref 0.0–0.2)

## 2019-06-18 LAB — SURGICAL PATHOLOGY

## 2019-06-18 MED ORDER — PANTOPRAZOLE SODIUM 40 MG PO TBEC: 40.0000 mg | DELAYED_RELEASE_TABLET | Freq: Two times a day (BID) | ORAL | 0 refills | Status: AC

## 2019-06-18 NOTE — Discharge Summary (Signed)
Physician Discharge Summary  Daisy Parker NMM:[id number] DOB: 03/24/1941   PCP: Patient, No Pcp Per  Admit date: 06/13/2019 Discharge date: 06/18/2019 Length of Stay: 5 days   Code Status: Full Code  Admitted From: [facility] Discharged to:  SNF Discharge Condition:  Stable  Recommendations for Outpatient Follow-up   1. Holding Eliquis for at least 7 days however she is likely not a candidate for anticoagulation given her GI bleeding has bled score: 3 which is high risk for re bleeding. She should be reevaluated by her cardiologist for atrial fibrillation 2. COVID 19 test positive prior to discharge, however she should not be deemed a risk as she was treated successfully with resolution of her COVID symptoms a month ago and may remain "positive" for up to 3 months 3. Protonix BID x 1 month then QD    Hospital Summary  This is a 79 year old female with history of severe dementia, atrial fibrillation on Eliquis, PPM, CKD 4, recently treated for COVID-19 last month who presented from SNF for anemia found to have Hb 6.6 at facility without associated melena, hematochezia, hematemesis and sent to [facility] ED on 3/4. Found to have Hemoccult positive on presentation and received 2 unit PRBC transfusion. GI was consulted.  3/6: Hb stable, EGD, poor prep-no colonoscopy.  Remained hemodynamically stable on room air and did not require further transfusion or scope. Discharged in stable condition to SNF  A & P   Principal Problem:   Acute blood loss anemia Active Problems:   Dementia (HCC)   HTN (hypertension)   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/min (HCC)   A-fib (HCC)   GI bleed   Symptomatic anemia   Heme positive stool   Multiple duodenal ulcers   1. Acute blood loss anemia due to GI bleed in setting of recently starting Eliquis for atrial fibrillation, status post 2 unit PRBC transfusion, stable a. 3/6: Poor prep-no colonoscopy; EGD: 3 nonbleeding duodenal ulcers with  clean ulcer bases (biopsies obtained), nonbleeding duodenal diverticulum, duodenitis, benign esophageal stenosis. b. Per GI: No NSAIDs, Protonix 40 mg p.o. twice daily x4 weeks then daily, await path results, colonoscopy if rebleeding, hold Eliquis for at least 7 days; probably not a good candidate for anticoagulation in the future though 2. Severe dementia a. Continue Zyprexa, Depakote, Zoloft 3. CKD 4, at baseline 4. Hypertension a. Continue Coreg and amlodipine 5. Atrial fibrillation, rate controlled a. Continue Coreg b. CHA2DS2-VASc of 4 c. Has bled: 3, high risk. Probably not a good candidate to restart anticoagulation going forward.  d. Hold Eliquis as above e. Recommend outpatient follow-up with cardiology in the next week to discuss anticoagulation 6. Osteoarthritis continue Voltaren gel  Discussed with daughter over the phone   Consultants  . GI  Procedures  . EGD/Colonoscopy 3/6  Antibiotics   Anti-infectives (From admission, onward)   None       Subjective  Examined at bedside no acute distress lying flat resting comfortably.  No events overnight.  Patient complains of chronic joint pain.  Patient has baseline severe dementia.   Objective   Discharge Exam: Vitals:   06/17/19 2051 06/18/19 0609  BP: 115/72 127/71  Pulse: 69 70  Resp: 16 16  Temp: (!) 97.2 F (36.2 C) (!) 97.4 F (36.3 C)  SpO2: 95% 92%   Vitals:   06/17/19 0608 06/17/19 1427 06/17/19 2051 06/18/19 0609  BP: 126/79 128/80 115/72 127/71  Pulse: 68 70 69 70  Resp: 16 15 16  16  Temp: 98 F (36.7 C) 97.6 F (36.4 C) (!) 97.2 F (36.2 C) (!) 97.4 F (36.3 C)  TempSrc: Oral Oral Oral Oral  SpO2: 97% 93% 95% 92%  Weight:      Height:        Physical Exam Vitals and nursing note reviewed.  Constitutional:      Appearance: Normal appearance.  HENT:     Head: Normocephalic and atraumatic.  Eyes:     Conjunctiva/sclera: Conjunctivae normal.  Cardiovascular:     Rate and Rhythm:  Normal rate and regular rhythm.  Pulmonary:     Effort: Pulmonary effort is normal.     Breath sounds: Normal breath sounds.  Abdominal:     General: Abdomen is flat.     Palpations: Abdomen is soft.  Musculoskeletal:        General: No swelling or tenderness.  Skin:    Coloration: Skin is not jaundiced or pale.  Neurological:     Mental Status: She is alert. Mental status is at baseline.  Psychiatric:        Mood and Affect: Mood normal.        Behavior: Behavior normal.       The results of significant diagnostics from this hospitalization (including imaging, microbiology, ancillary and laboratory) are listed below for reference.     Microbiology: Recent Results (from the past 240 hour(s))  SARS CORONAVIRUS 2 (TAT 6-24 HRS) Nasopharyngeal Nasopharyngeal Swab     Status: Abnormal   Collection Time: 06/17/19 10:16 AM   Specimen: Nasopharyngeal Swab  Result Value Ref Range Status   SARS Coronavirus 2 POSITIVE (A) NEGATIVE Final    Comment: RESULT CALLED TO, READ BACK BY AND VERIFIED WITH: A FIELDS,RN 1919 06/17/2019 D BRADLEY (NOTE) SARS-CoV-2 target nucleic acids are DETECTED. The SARS-CoV-2 RNA is generally detectable in upper and lower respiratory specimens during the acute phase of infection. Positive results are indicative of the presence of SARS-CoV-2 RNA. Clinical correlation with patient history and other diagnostic information is  necessary to determine patient infection status. Positive results do not rule out bacterial infection or co-infection with other viruses.  The expected result is Negative. Fact Sheet for Patients: SugarRoll.be Fact Sheet for Healthcare Providers: https://www.woods-mathews.com/ This test is not yet approved or cleared by the [place] FDA and  has been authorized for detection and/or diagnosis of SARS-CoV-2 by FDA under an Emergency Use Authorization (EUA). This EUA will remain  in effect  (meaning this test can be used) for the  duration of the COVID-19 declaration under Section 564(b)(1) of the Act, 21 U.S.C. section 360bbb-3(b)(1), unless the authorization is terminated or revoked sooner. Performed at [facility], [place] [address], [place], [place] [address]      Labs: BNP (last 3 results) Recent Labs    05/16/19 0400  BNP 295.2*   Basic Metabolic Panel: Recent Labs  Lab 06/13/19 1815 06/14/19 1451 06/15/19 0447 06/16/19 0510  NA 134* 136 137 134*  K 5.1 4.9 4.9 4.8  CL 103 106 108 106  CO2 22 21* 22 21*  GLUCOSE 205* 153* 107* 126*  BUN 74* 70* 63* 59*  CREATININE 2.38* 2.25* 2.30* 2.48*  CALCIUM 9.0 8.9 8.8* 8.6*   Liver Function Tests: Recent Labs  Lab 06/13/19 1815  AST 21  ALT 31  ALKPHOS 78  BILITOT 0.5  PROT 5.5*  ALBUMIN 2.3*   No results for input(s): LIPASE, AMYLASE in the last 168 hours. No results for input(s): AMMONIA in  the last 168 hours. CBC: Recent Labs  Lab 06/13/19 1815 06/13/19 1815 06/14/19 1451 06/14/19 2034 06/15/19 0447 06/16/19 0510 06/18/19 0814  WBC 4.8  --  5.9  --  5.5 5.1 6.0  NEUTROABS 3.2  --   --   --   --   --   --   HGB 6.8*   < > 8.7*  8.8* 8.7* 8.4* 8.6* 8.3*  HCT 21.5*   < > 27.3*  27.2* 27.5* 26.7* 27.0* 26.9*  MCV 96.4  --  89.8  --  90.2 91.2 92.1  PLT 200  --  196  --  189 190 207   < > = values in this interval not displayed.   Cardiac Enzymes: No results for input(s): CKTOTAL, CKMB, CKMBINDEX, TROPONINI in the last 168 hours. BNP: Invalid input(s): POCBNP CBG: No results for input(s): GLUCAP in the last 168 hours. D-Dimer No results for input(s): DDIMER in the last 72 hours. Hgb A1c No results for input(s): HGBA1C in the last 72 hours. Lipid Profile No results for input(s): CHOL, HDL, LDLCALC, TRIG, CHOLHDL, LDLDIRECT in the last 72 hours. Thyroid function studies No results for input(s): TSH, T4TOTAL, T3FREE, THYROIDAB in the last 72 hours.  Invalid input(s):  FREET3 Anemia work up No results for input(s): VITAMINB12, FOLATE, FERRITIN, TIBC, IRON, RETICCTPCT in the last 72 hours. Urinalysis    Component Value Date/Time   COLORURINE YELLOW 05/16/2019 1133   APPEARANCEUR TURBID (A) 05/16/2019 1133   LABSPEC 1.019 05/16/2019 1133   PHURINE 5.0 05/16/2019 1133   GLUCOSEU NEGATIVE 05/16/2019 1133   HGBUR NEGATIVE 05/16/2019 1133   BILIRUBINUR NEGATIVE 05/16/2019 1133   KETONESUR NEGATIVE 05/16/2019 1133   PROTEINUR 100 (A) 05/16/2019 1133   NITRITE NEGATIVE 05/16/2019 1133   LEUKOCYTESUR LARGE (A) 05/16/2019 1133   Sepsis Labs Invalid input(s): PROCALCITONIN,  WBC,  LACTICIDVEN Microbiology Recent Results (from the past 240 hour(s))  SARS CORONAVIRUS 2 (TAT 6-24 HRS) Nasopharyngeal Nasopharyngeal Swab     Status: Abnormal   Collection Time: 06/17/19 10:16 AM   Specimen: Nasopharyngeal Swab  Result Value Ref Range Status   SARS Coronavirus 2 POSITIVE (A) NEGATIVE Final    Comment: RESULT CALLED TO, READ BACK BY AND VERIFIED WITH: A FIELDS,RN 1919 06/17/2019 D BRADLEY (NOTE) SARS-CoV-2 target nucleic acids are DETECTED. The SARS-CoV-2 RNA is generally detectable in upper and lower respiratory specimens during the acute phase of infection. Positive results are indicative of the presence of SARS-CoV-2 RNA. Clinical correlation with patient history and other diagnostic information is  necessary to determine patient infection status. Positive results do not rule out bacterial infection or co-infection with other viruses.  The expected result is Negative. Fact Sheet for Patients: SugarRoll.be Fact Sheet for Healthcare Providers: https://www.woods-mathews.com/ This test is not yet approved or cleared by the [place] FDA and  has been authorized for detection and/or diagnosis of SARS-CoV-2 by FDA under an Emergency Use Authorization (EUA). This EUA will remain  in effect (meaning this test can be  used) for the  duration of the COVID-19 declaration under Section 564(b)(1) of the Act, 21 U.S.C. section 360bbb-3(b)(1), unless the authorization is terminated or revoked sooner. Performed at [facility], [place] [address] [place], [place] [address]     Discharge Instructions     Discharge Instructions    Diet - low sodium heart healthy   Complete by: As directed    Discharge instructions   Complete by: As directed    You were  seen and examined in the hospital for GI bleed and cared for by a hospitalist and Gastroenterologist  Upon Discharge:  - stop taking eliquis for at least a total of 7 days (earliest dose 3/12, though she should be reevaluated by her Cardiologist before having this restarted. She is not likely a candidate for anticoagulation at this point). - Make an appointment with cardiology within 7 days to discuss restarting Eliquis - start taking Protonix twice daily for one month then once daily - No NSAIDs Request that your primary physician go over all hospital tests and procedures/radiological results at the follow up.   Please get all hospital records sent to your physician by signing a hospital release before you go home.   Read the complete instructions along with all the possible side effects for all the medicines you take and that have been prescribed to you. Take any new medicines after you have completely understood and accept all the possible adverse reactions/side effects.   If you have any questions about your discharge medications or the care you received while you were in the hospital, you can call the unit and asked to speak with the hospitalist on call. Once you are discharged, your primary care physician will handle any further medical issues. Please note that NO REFILLS for any discharge medications will be authorized, as it is imperative that you return to your primary care physician (or establish a relationship with a primary care physician if  you do not have one) for your aftercare needs so that they can reassess your need for medications and monitor your lab values.   Do not drive, operate heavy machinery, perform activities at heights, swimming or participation in water activities or provide baby sitting services if your were admitted for loss of consciousness/seizures or if you are on sedating medications including, but not limited to benzodiazepines, sleep medications, narcotic pain medications, etc., until you have been cleared to do so by a medical doctor.   Do not take more than prescribed medications.   Wear a seat belt while driving.  If you have smoked or chewed Tobacco in the last 2 years please stop smoking; also stop any regular Alcohol and/or any Recreational drug use including marijuana.  If you experience worsening of your admission symptoms or develop shortness of breath, chest pain, suicidal or homicidal thoughts or experience a life threatening emergency, you must seek medical attention immediately by calling 911 or calling your PCP immediately.   Increase activity slowly   Complete by: As directed      Allergies as of 06/18/2019      Reactions   Codeine Anaphylaxis   Sulfa Antibiotics Anaphylaxis      Medication List    STOP taking these medications   apixaban 5 MG Tabs tablet Commonly known as: Eliquis     TAKE these medications   acetaminophen 500 MG tablet Commonly known as: TYLENOL Take 1,000 mg by mouth 3 (three) times daily. 0800, 1400, 2000.   allopurinol 100 MG tablet Commonly known as: ZYLOPRIM Take 100 mg by mouth daily.   amLODipine 10 MG tablet Commonly known as: NORVASC Take 1 tablet (10 mg total) by mouth at bedtime.   brimonidine 0.2 % ophthalmic solution Commonly known as: ALPHAGAN Place 1 drop into both eyes 2 (two) times daily.   carvedilol 6.25 MG tablet Commonly known as: COREG Take 1 tablet (6.25 mg total) by mouth 2 (two) times daily with a meal.   Daily Vite  Multivitamin/Iron Tabs  Take 1 tablet by mouth daily.   diclofenac Sodium 1 % Gel Commonly known as: VOLTAREN Apply 2 g topically 3 (three) times daily. Applied to knees, hips, and lower back.   divalproex 125 MG capsule Commonly known as: DEPAKOTE SPRINKLE Take 125 mg by mouth 2 (two) times daily.   fluticasone 50 MCG/ACT nasal spray Commonly known as: FLONASE Place 1 spray into both nostrils daily.   latanoprost 0.005 % ophthalmic solution Commonly known as: XALATAN Place 1 drop into both eyes at bedtime.   levothyroxine 150 MCG tablet Commonly known as: SYNTHROID Take 150 mcg by mouth daily before breakfast.   OLANZapine 7.5 MG tablet Commonly known as: ZYPREXA Take 7.5 mg by mouth at bedtime.   pantoprazole 40 MG tablet Commonly known as: Protonix Take 1 tablet (40 mg total) by mouth 2 (two) times daily.   polyethylene glycol 17 g packet Commonly known as: MIRALAX / GLYCOLAX Take 17 g by mouth daily.   sertraline 25 MG tablet Commonly known as: ZOLOFT Take 25 mg by mouth daily.   sodium bicarbonate 650 MG tablet Take 1 tablet (650 mg total) by mouth 2 (two) times daily.   Vitamin D 50 MCG (2000 UT) tablet Take 2,000 Units by mouth daily.      Contact information for after-discharge care    [facility] .   Service: Skilled Nursing Contact information: [place] [phone]             Allergies  Allergen Reactions  . Codeine Anaphylaxis  . Sulfa Antibiotics Anaphylaxis    Time coordinating discharge: Over 30 minutes   SIGNED:   Harold Hedge, D.O. [facility] Pager: [phone]  06/18/2019, 9:56 AM

## 2019-06-18 NOTE — TOC Transition Note (Signed)
Transition of Care [facility]) - CM/SW Discharge Note   Patient Details  Name: Marica Trentham MRN: [id number] Date of Birth: May 21, 1940  [facility]) CM/SW Contact:  [name], RN Phone Number: 06/18/2019, 9:46 AM   Clinical Narrative: Awaiting d/c summary for d/c to [place] rep able to accept,has bed,& covid +.PTAR once rm#,tel# for report given.      Final next level of care: [place] Barriers to Discharge: No Barriers Identified   Patient Goals and CMS Choice Patient states their goals for this hospitalization and ongoing recovery are:: return back to [facility].gov Compare Post Acute Care list provided to:: Patient Represenative (must comment)(dtr [name])    Discharge Placement PASRR number recieved: 06/17/19            Patient chooses bed at: [facility] Patient to be transferred to facility by: [place] Name of family member notified: [name] dtr Patient and family notified of of transfer: 06/18/19  Discharge Plan and Services   Discharge Planning Services: CM Consult                                 Social Determinants of Health ([place]) Interventions     Readmission Risk Interventions No flowsheet data found.

## 2019-06-18 NOTE — Plan of Care (Signed)
Goals met

## 2019-06-18 NOTE — TOC Progression Note (Signed)
Transition of Care [facility]) - Progression Note    Patient Details  Name: Daisy Parker MRN: [id number] Date of Birth: [birth year]  [facility]) CM/SW Contact  Melody Cirrincione, Juliann Pulse, RN Phone Number: 06/18/2019, 11:06 AM  Clinical Narrative: Nelwyn [place] left VM w/[name] await call back prior to transporting patient to [facility] by [place].going to rm #2B, tel# for report [phone].      Expected Discharge Plan: [place] Barriers to Discharge: No Barriers Identified  Expected Discharge Plan and Services Expected Discharge Plan: [place]   Discharge Planning Services: CM Consult   Living arrangements for the past 2 months: Assisted Living Facility(Memory care) Expected Discharge Date: 06/17/19                                     Social Determinants of Health (SDOH) Interventions    Readmission Risk Interventions No flowsheet data found.

## 2019-06-18 NOTE — TOC Transition Note (Signed)
Transition of Care [facility]) - CM/SW Discharge Note   Patient Details  Name: Bitha Fauteux MRN: [id number] Date of Birth: March 19, 1941  Transition of Care [facility]) CM/SW Contact:  [name], RN Phone Number: 06/18/2019, 11:17 AM   Clinical Narrative: dtr returned call-informed me that she received a call from [facility] of her mom testing + for covid-[place] per dtr should not have been informed it should have beed [place]-N[facility] staff informed. CM informed [place].[facility] called for transport 11:45a agreed by dtr. No further CM needs.      Final next level of care: [place] Barriers to Discharge: No Barriers Identified   Patient Goals and CMS Choice Patient states their goals for this hospitalization and ongoing recovery are:: return back to [facility].gov Compare Post Acute Care list provided to:: Patient Represenative (must comment)(dtr [name])    Discharge Placement PASRR number recieved: 06/17/19            Patient chooses bed at: [facility] Patient to be transferred to facility by: [place] Name of family member notified: [name] dtr Patient and family notified of of transfer: 06/18/19  Discharge Plan and Services   Discharge Planning Services: CM Consult                                 Social Determinants of Health ([place]) Interventions     Readmission Risk Interventions No flowsheet data found.

## 2019-06-19 NOTE — Telephone Encounter (Signed)
Spoke to pt's dtr who is her legal guardian. Informed Dr. Curt Bears recommends OV to discuss.  She reports pt has been discharged to rehab facility ([facility] in [place]).  Not sure how long pt will be there before being able to return to [facility] where she resides. Dtr reports that GI doesn't feel it is safe for pt to return to blood thinner to due elevated GIB risk. Endoscopy performed by Dr. Fuller Plan while in hospital, dtr would like Dr. Curt Bears to review findings/recommendation from that test - H pylori/abx.  She also would like him to review d/c notes from Dr. Neysa Bonito who also stated pt should not restart blood thinner. Made aware I would inform Dr. Curt Bears and let her know what he says.  Discussed w/ Dr. Curt Bears who will call dtr next week to discuss. (she is on phone a lot w/ work and wants you to leave a detailed message of time you can call back and she will make sure she is clear to receive call)

## 2019-06-21 ENCOUNTER — Non-Acute Institutional Stay: Payer: Medicare Other | Admitting: Nurse Practitioner

## 2019-06-21 ENCOUNTER — Encounter: Payer: Self-pay | Admitting: Nurse Practitioner

## 2019-06-21 ENCOUNTER — Other Ambulatory Visit: Payer: Self-pay

## 2019-06-21 VITALS — BP 118/66 | HR 74 | Temp 97.8°F | Resp 18 | Wt 177.0 lb

## 2019-06-21 DIAGNOSIS — Z515 Encounter for palliative care: Secondary | ICD-10-CM

## 2019-06-21 DIAGNOSIS — F0390 Unspecified dementia without behavioral disturbance: Secondary | ICD-10-CM

## 2019-06-21 NOTE — Progress Notes (Signed)
[facility] Consult Note Telephone: [phone]  Fax: [phone]  PATIENT NAME: Daisy Parker DOB: 01/24/1941 MRN: [id number]  PRIMARY CARE PROVIDER:   Dr Yves Dill PROVIDER:  Dr Hodges/[facility] RESPONSIBLE PARTY:   Rehema Muffley daughter [phone]  I was asked to see Daisy. Daisy Parker by Dr Nyra Capes for Palliative care consult for goals of care  RECOMMENDATIONS and PLAN:  1. ACP: Full code, pending discussion with daughter   2. Palliative care encounter; Palliative medicine team will continue to support patient, patient's family, and medical team. Visit consisted of counseling and education dealing with the complex and emotionally intense issues of symptom management and palliative care in the setting of serious and potentially life-threatening illness  I spent 65 minutes providing this consultation,  from 11:05am to 12:10pm. More than 50% of the time in this consultation was spent coordinating communication.   HISTORY OF PRESENT ILLNESS:  Daisy Parker is a 79 y.o. 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 Hospitalised 11 / 27 / 2020 at [facility] for syncope at [place]. Workout significant to dementia, concern for UTI and dehydration with decreased oral intake. 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 E coli UTI. Palliative care consult during this hospitalization, most form was introduced but not completed with daughter. Remained full code. Hospitalized 2/3 / 2021 to 2 / 9 / 2021 from [facility] for covid positive on 1/20 / 2021 with elevated inflammation markers and UTI receiving antibiotics. Acute hypoxic respiratory failure  secondary to [place] in [place]. Acute kidney injury on chronic kidney disease with acute metabolic acidosis. Hospitalized 3/4 / 2021 to 3 / 9 / 2021 for anemia found out hemoglobin 6.6 at SNF with hemoccult positive and received two units of packed red blood cells with EGD consulted. 3 / 6 / 2021 hemoglobin stable with EGD prep prep no colonoscopy. She did not require any further transfusion. Plan was to return to SNF holding Eliquis for 7 days not a candidate for anticoagulation given GI bleed. Covid test positive prior to discharge however she should not be deemed as the risk as she was successful with resolution of our coded symptoms a month ago and possibly could remain positive for up to three months. Severe dementia continued on Zyprexa, Depakote and Zoloft. At present Daisy Parker is at [place] facility at [facility]. Daisy Parker appears bed-bound, ADL require staff to turn in position, mobility, adl's and toileting. Daisy Parker has been feeding herself with a declined appetite. Stsff endorses Daisy. Monia Parker does sleep a lot. I present Daisy Parker is lying in bed. Daisy Parker does appear weak, pale though comfortable. No visitors present. I visited and observe Daisy Parker. No visitors present. I visited and observe Daisy Parker. We talked about how she was feeling today. Daisy Parker endorses that she is tired. Daisy Parker endorses that she does sleep a lot. We talked about symptoms of pain and shortness of breath but she denies. We talked about her appetite being declined. Daisy Parker endorses she overall she is not hungry. We talked about multiple hospitalizations so limited as Daisy Parker became tired during palliative care visit. Daisy. Parker was cooperative with assessment. We talked  about medical goals of care. We talked about her daughter and asked if it was okay to contact for update on palliative care visit. Daisy Parker was in agreement. Emotional support provided to Daisy  Parker. We talked about role of palliative care and plan of care. Currently Daisy Parker is a full code. Will continue to monitor and follow and recheck in one week or sooner should she declined. I have attempted to contact her daughter, message left and will continue for further discussion of medical goals of care. At present no new changes two goals are plan of care and updated nursing staff Palliative Care was asked to help address goals of care.   CODE STATUS: full code  PPS: 30% HOSPICE ELIGIBILITY/DIAGNOSIS: TBD  PAST MEDICAL HISTORY:  Past Medical History:  Diagnosis Date  . Acute lower UTI 05/17/2019  . AKI (acute kidney injury) ([place]) 05/16/2018  . Anemia   . Bradycardia   . Cataract   . CKD (chronic kidney disease) 05/14/2018  . CKD (chronic kidney disease), stage IV ([place])   . COVID-19 virus infection 05/15/2019  . Dementia ([place])   . DNR (do not resuscitate) discussion   . Duodenal perforation ([place])   . Encephalopathy acute 05/14/2018  . HTN (hypertension) 05/14/2018  . Hypertension   . Hypothyroidism   . Mitral valve mass   . Palliative care by specialist   . Pneumonia due to COVID-19 virus 05/15/2019  . RBBB   . Second degree AV block, Mobitz type I   . Second degree AV block, Mobitz type II   . Syncope   . Urinary frequency 03/13/2019    SOCIAL HX:  Social History   Tobacco Use  . Smoking status: Former Research scientist (life sciences)  . Smokeless tobacco: Never Used  Substance Use Topics  . Alcohol use: Never    ALLERGIES:  Allergies  Allergen Reactions  . Codeine Anaphylaxis  . Sulfa Antibiotics Anaphylaxis     PERTINENT MEDICATIONS:  Outpatient Encounter Medications as of 06/21/2019  Medication Sig  . acetaminophen (TYLENOL) 500 MG tablet Take 1,000 mg by mouth 3 (three) times daily. 0800, 1400, 2000.  Marland Kitchen allopurinol (ZYLOPRIM) 100 MG tablet Take 100 mg by mouth daily.  Marland Kitchen amLODipine (NORVASC) 10 MG tablet Take 1 tablet (10 mg total) by mouth at bedtime.  . brimonidine (ALPHAGAN) 0.2 %  ophthalmic solution Place 1 drop into both eyes 2 (two) times daily.  . carvedilol (COREG) 6.25 MG tablet Take 1 tablet (6.25 mg total) by mouth 2 (two) times daily with a meal.  . Cholecalciferol (VITAMIN D) 50 MCG (2000 UT) tablet Take 2,000 Units by mouth daily.  . diclofenac Sodium (VOLTAREN) 1 % GEL Apply 2 g topically 3 (three) times daily. Applied to knees, hips, and lower back.  . divalproex (DEPAKOTE SPRINKLE) 125 MG capsule Take 125 mg by mouth 2 (two) times daily.  . fluticasone (FLONASE) 50 MCG/ACT nasal spray Place 1 spray into both nostrils daily.  Marland Kitchen latanoprost (XALATAN) 0.005 % ophthalmic solution Place 1 drop into both eyes at bedtime.  Marland Kitchen levothyroxine (SYNTHROID) 150 MCG tablet Take 150 mcg by mouth daily before breakfast.  . Multiple Vitamins-Iron (DAILY VITE MULTIVITAMIN/IRON) TABS Take 1 tablet by mouth daily.  Marland Kitchen OLANZapine (ZYPREXA) 7.5 MG tablet Take 7.5 mg by mouth at bedtime.  . pantoprazole (PROTONIX) 40 MG tablet Take 1 tablet (40 mg total) by mouth 2 (two) times daily.  . polyethylene glycol (MIRALAX / GLYCOLAX) packet Take 17 g by mouth daily.  Marland Kitchen  sertraline (ZOLOFT) 25 MG tablet Take 25 mg by mouth daily.   . sodium bicarbonate 650 MG tablet Take 1 tablet (650 mg total) by mouth 2 (two) times daily.   No facility-administered encounter medications on file as of 06/21/2019.    PHYSICAL EXAM:   General: Obese, pale, chronically ill, debilitated pleasant female Cardiovascular: regular rate and rhythm Pulmonary: clear ant fields; decreased Abdomen: soft, nontender, + bowel sounds Extremities: mild BLE edema, no joint deformities Neurological: generalized weakness  Romone Shaff Ihor Gully, NP

## 2019-07-03 ENCOUNTER — Other Ambulatory Visit: Payer: Self-pay

## 2019-07-03 ENCOUNTER — Encounter: Payer: Self-pay | Admitting: Nurse Practitioner

## 2019-07-03 ENCOUNTER — Non-Acute Institutional Stay: Payer: Medicare Other | Admitting: Nurse Practitioner

## 2019-07-03 DIAGNOSIS — F0390 Unspecified dementia without behavioral disturbance: Secondary | ICD-10-CM

## 2019-07-03 DIAGNOSIS — Z515 Encounter for palliative care: Secondary | ICD-10-CM

## 2019-07-03 NOTE — Telephone Encounter (Signed)
Spoke with the patient's daughter regarding her anticoagulation and issues with her GI bleed.  Discussed stroke risk being close to if not greater than 5 %/year due to comorbidities.  Her daughter understands and like to further consider whether or not anticoagulation is indicated.  She Lan Entsminger call us back with an answer.

## 2019-07-03 NOTE — Progress Notes (Signed)
[facility] Consult Note Telephone: [phone]  Fax: [phone]  PATIENT NAME: Daisy Parker DOB: [birth year] MRN: [id number]  PRIMARY CARE PROVIDER:   Dr Yves Dill PROVIDER:  Dr Hodges/[facility] RESPONSIBLE PARTY:   Kymorah Korf daughter [phone]  RECOMMENDATIONS and PLAN: 1.ACP: Full code discussed with Pamala Hurry daughter endorses wishes are for aggressive interventions, Treat what is treatable; MOST form completed at [facility]; then 02/2019 [facility] but not in [facility]; [name] does not have a copy. [name] in agree when Ms. Capetillo is clear with next pc visit to do a conference call to complete to sign.  2.Palliative care encounter; Palliative medicine team will continue to support patient, patient's family, and medical team. Visit consisted of counseling and education dealing with the complex and emotionally intense issues of symptom management and palliative care in the setting of serious and potentially life-threatening illness  I spent 45 minutes providing this consultation,  from 2:45 to 3:30pm. More than 50% of the time in this consultation was spent coordinating communication.   HISTORY OF PRESENT ILLNESS:  Daisy Parker is a 79 y.o. 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 Hospitalised 11 / 27 / 2020 at [facility] for syncope at [place]. Workout significant to dementia, concern for UTI and dehydration with decreased oral intake. 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 E coli UTI. Palliative care consult during this hospitalization, most form was introduced but not completed with daughter. Remained full code.  Hospitalized 2/3 / 2021 to 2 / 9 / 2021 from [facility] for covid positive on 1/20 / 2021 with elevated inflammation markers and UTI receiving antibiotics. Acute hypoxic respiratory failure secondary to Covid pneumonia. Acute kidney injury on chronic kidney disease with acute metabolic acidosis. Hospitalized 3/4 / 2021 to 3 / 9 / 2021 for anemia found out hemoglobin 6.6 at SNF with hemoccult positive and received two units of packed red blood cells with EGD consulted. 3 / 6 / 2021 hemoglobin stable with EGD prep prep no colonoscopy. She did not require any further transfusion. Plan was to return to SNF holding Eliquis for 7 days not a candidate for anticoagulation given GI bleed. Covid test positive prior to discharge however she should not be deemed as the risk as she was successful with resolution of our coded symptoms a month ago and possibly could remain positive for up to three months. Severe dementia continued on Zyprexa, Depakote and Zoloft. Ms. Nouri continues to reside Short-Term rehab at [facility]. Ms Macdonnell is currently receiving therapy for which it is slow to progress. Ms Mayfield does require total assistance for transfers, mobility, adl's, toileting. Ms Wanat does require assistance with tray set up but is able to feed herself with a decreased appetite. Ms Granholm is able to verbalize her needs per staff do does get things confused at times. At present Ms Lungren is sitting in the wheelchair in her room. Ms Breshears appears comfortable though debilitated. No visitors present. I visited and observedMs. Sherryl Barters. We talked about purpose of palliative care visit and she was in agreement. We talked about how she is  feeling today. Ms Abeyta asked when she will be able to return home. When asked where is  home she was unable to say although she did mention a facility that she lived in. We talked about symptoms of pain when she did denies. We talked about shortness of breath when  she denied. We talked a better appetite. Ms Seelye endorses the food is not very good. Ms Roemmich endorses she has not had much of an appetite. We talked about her work with there be. Ms Zucco endorses she would want to walk but his been having a lot of difficulty. We talked about the edema in her lower extremities. Ms. Hoerner talked about her daughter [name]. Attempted life review though she had difficulty with events in time. We talked about role of palliative care. Ms Wible asked to contact [name] for further questions about medical history. Emotional support provided. I called [name], Ms Hoban's daughter. We talked about purpose of palliative care visit. [name] in agreement, consent completed. We talked about the last time Ms Ek was independent at home. We talked about past medical history in the setting of chronic disease and progression. We talked about the last place Ms Ellithorpe resided which was [facility]. [name] endorses staff was concerned about increase in skill level and initiating discussions about possibly transitioning to long-term care. We talked about multiple recent hospitalizations that led up to current hospitalization. We talked about Ms. Orrick currently residing at [facility] care for [employer]. We talked about her functional level prior to rehab and currently. We discussed that Ms Osoria is currently able to sit up in a chair though she is a total transfer. We talked about concern for increasing skill level that Ms. Hammersmith may require long-term care. [name] endorses she has a care plan meeting tomorrow. We talked about questions to ask at the care plan meeting to include about therapy, when therapy will end, discharge planning. We talked about medical history. We talked about psychiatric medical history depression. We talked about option of Psychiatrist in Psychotherapy at [facility] while she is at [employer]. [name] in agreement.  Discuss with [name] that she will need to complete a consent for Psychiatry with the social worker do in the care plan meeting tomorrow be sure to mention request. [name] in agreement. We talked about medical goals of care. We talked about code status. [name] wishes to be a full code and remain so. [name] endorses she just recently discussed that with [name]. We talked about the most form that was completed. [name] endorses she does not have a copy of it. Will contact [facility] to see if it is there. We talked about role of palliative care and plan of care. Discuss that will continue to follow a monetary check in 1. We talked about code status. Ms Garn wishes to be a full code. [name] endorses she just recently discussed that with Ms Monfort. We talked about the most form that was completed. [name] endorses she does not have a copy of it. Will contact [facility] to see if it is there. We talked about role of palliative care and plan of care. Discuss that will continue to follow and monitor. Follow up in 2 weeks to see how Ms Canizares doing with therapy and further discharge planning. [name] in agreement. Therapeutic listening and emotional support provided. Life review completed. Questions answered the satisfaction. Contact information  Palliative Care was asked to help to continue to address goals of care.   CODE STATUS: full code  PPS: 40% HOSPICE ELIGIBILITY/DIAGNOSIS: TBD  PAST MEDICAL HISTORY:  Past Medical History:  Diagnosis Date  . Acute lower UTI 05/17/2019  . AKI (acute kidney injury) ([place]) 05/16/2018  . Anemia   . Bradycardia   . Cataract   . CKD (chronic kidney disease) 05/14/2018  . CKD (chronic kidney disease), stage IV ([place])   . COVID-19 virus infection 05/15/2019  . Dementia ([place])   . DNR (do not resuscitate) discussion   . Duodenal perforation ([place])   . Encephalopathy acute 05/14/2018  . HTN (hypertension) 05/14/2018  . Hypertension   . Hypothyroidism   . Mitral  valve mass   . Palliative care by specialist   . Pneumonia due to COVID-19 virus 05/15/2019  . RBBB   . Second degree AV block, Mobitz type I   . Second degree AV block, Mobitz type II   . Syncope   . Urinary frequency 03/13/2019    SOCIAL HX:  Social History   Tobacco Use  . Smoking status: Former Research scientist (life sciences)  . Smokeless tobacco: Never Used  Substance Use Topics  . Alcohol use: Never    ALLERGIES:  Allergies  Allergen Reactions  . Codeine Anaphylaxis  . Sulfa Antibiotics Anaphylaxis     PERTINENT MEDICATIONS:  Outpatient Encounter Medications as of 07/03/2019  Medication Sig  . acetaminophen (TYLENOL) 500 MG tablet Take 1,000 mg by mouth 3 (three) times daily. 0800, 1400, 2000.  Marland Kitchen allopurinol (ZYLOPRIM) 100 MG tablet Take 100 mg by mouth daily.  Marland Kitchen amLODipine (NORVASC) 10 MG tablet Take 1 tablet (10 mg total) by mouth at bedtime.  . brimonidine (ALPHAGAN) 0.2 % ophthalmic solution Place 1 drop into both eyes 2 (two) times daily.  . carvedilol (COREG) 6.25 MG tablet Take 1 tablet (6.25 mg total) by mouth 2 (two) times daily with a meal.  . Cholecalciferol (VITAMIN D) 50 MCG (2000 UT) tablet Take 2,000 Units by mouth daily.  . diclofenac Sodium (VOLTAREN) 1 % GEL Apply 2 g topically 3 (three) times daily. Applied to knees, hips, and lower back.  . divalproex (DEPAKOTE SPRINKLE) 125 MG capsule Take 125 mg by mouth 2 (two) times daily.  . fluticasone (FLONASE) 50 MCG/ACT nasal spray Place 1 spray into both nostrils daily.  Marland Kitchen latanoprost (XALATAN) 0.005 % ophthalmic solution Place 1 drop into both eyes at bedtime.  Marland Kitchen levothyroxine (SYNTHROID) 150 MCG tablet Take 150 mcg by mouth daily before breakfast.  . Multiple Vitamins-Iron (DAILY VITE MULTIVITAMIN/IRON) TABS Take 1 tablet by mouth daily.  Marland Kitchen OLANZapine (ZYPREXA) 7.5 MG tablet Take 7.5 mg by mouth at bedtime.  . pantoprazole (PROTONIX) 40 MG tablet Take 1 tablet (40 mg total) by mouth 2 (two) times daily.  . polyethylene glycol  (MIRALAX / GLYCOLAX) packet Take 17 g by mouth daily.  . sertraline (ZOLOFT) 25 MG tablet Take 25 mg by mouth daily.   . sodium bicarbonate 650 MG tablet Take 1 tablet (650 mg total) by mouth 2 (two) times daily.   No facility-administered encounter medications on file as of 07/03/2019.    PHYSICAL EXAM:   General: debilitated, oriented to self, chronically ill female Cardiovascular: regular rate and rhythm Pulmonary: clear ant fields Abdomen: soft, nontender, + bowel sounds Extremities: +BLE edema, no joint deformities Neurological: W/c dependent; generalized weakness  Kamisha Ell Ihor Gully, NP

## 2019-07-10 ENCOUNTER — Encounter: Payer: Self-pay | Admitting: Nurse Practitioner

## 2019-07-10 ENCOUNTER — Other Ambulatory Visit: Payer: Self-pay

## 2019-07-10 ENCOUNTER — Non-Acute Institutional Stay: Payer: Medicare Other | Admitting: Nurse Practitioner

## 2019-07-10 VITALS — BP 118/66 | HR 74 | Temp 97.2°F | Resp 18 | Wt 177.0 lb

## 2019-07-10 DIAGNOSIS — F0390 Unspecified dementia without behavioral disturbance: Secondary | ICD-10-CM

## 2019-07-10 DIAGNOSIS — Z515 Encounter for palliative care: Secondary | ICD-10-CM

## 2019-07-10 NOTE — Progress Notes (Signed)
[facility] Consult Note Telephone: [phone]  Fax: [phone]  PATIENT NAME: Daisy Parker DOB: 09-17-1940 MRN: [id number]  PRIMARY CARE PROVIDER:Dr Yves Dill PROVIDER:Dr Hodges/[place] daughter [phone]  RECOMMENDATIONS and PLAN: 1.OAC:ZYSA code discussed with Pamala Hurry daughter endorses wishes are for aggressive interventions, Treat what is treatable; MOST form completed at [facility]; then 02/2019 Zacarias Pontes but not in [place]/Epic; [name] and  I discussed will revisit, mail [name] a blank MOST form and Hard Choice book for review  2.Palliative care encounter; Palliative medicine team will continue to support patient, patient's family, and medical team. Visit consisted of counseling and education dealing with the complex and emotionally intense issues of symptom management and palliative care in the setting of serious and potentially life-threatening illness  I spent 61 minutes providing this consultation,  from 2:05pm to 2:51pm. More than 50% of the time in this consultation was spent coordinating communication.   HISTORY OF PRESENT ILLNESS:  Daisy Parker is a 79 y.o. 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 Hospitalized 11 / 27 / 2020 at [facility] for syncope at [place]. Workout significant to dementia, concern for UTI and dehydration with decreased oral intake. 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 E coli UTI. Palliative care consult during this hospitalization, most form was introduced but not completed with daughter. Remained full code. Hospitalized 2/3 / 2021 to 2 / 9 / 2021  from [facility] for covid positive on 1/20 / 2021 with elevated inflammation markers and UTI receiving antibiotics. Acute hypoxic respiratory failure secondary to Covid pneumonia. Acute kidney injury on chronic kidney disease with acute metabolic acidosis. Hospitalized 3/4 / 2021 to 3 / 9 / 2021 for anemia found out hemoglobin 6.6 at SNF with hemoccult positive and received two units of packed red blood cells with EGD consulted. 3 / 6 / 2021 hemoglobin stable with EGD prep prep no colonoscopy. She did not require any further transfusion. Daisy Parker continues to reside at [facility] at [facility]. Staff endorses continue to get her out of bed requiring assistance for transfers, adl's, toileting. She does continue to feed herself and appetite has been fair depending on what food is being served. Daisy. Brutus did have a fall on 3 / 12 / 2021 no noted injury. Care plan meeting held on 3/25 / 2021. Psychiatry did see 3 / 25 / 2021 for dementia, major NCD currently prescribed Depakote and Zoloft with Zyprexa. No adjustment at this time no further investigation for Zyprexa use to determine if history of psychosis or AVH / delusions. Staff endorses no other changes or concerns. At present Daisy Parker is sitting up in her bed. Daisy Parker appears comfortable. I visited and observed Daisy. Sherryl Parker. We talked about purpose of palliative care visit. Daisy. Parker did make eye contact to verbal cues. We talked about last palliative visit and this davson does not recall. We talked about symptoms of pain and shortness of breath would she denied. We talked about appetite. We talked about foods that she likes. We talked about getting up to the wheelchair. Daisy. Parker denies that she has been up since she has been at the facility. Daisy. Parker did have more  difficulty with cognition today, seems a little more confused. Daisy. Parker did have more trouble with recall and answering questions.  Daisy. Parker was  cooperative with assessment. Emotional support provided. I called [name], Daisy. Lorino daughter for update on palliative care visit. [name] also left a message to update on care plan meeting. We talked about palliative care visit with Daisy. Sherryl Parker. We talked about Daisy. Daisy Parker being more confused today than last palliative visit. We talked about progression of dementia. We talked about work with therapy and timing of the day. We talked about care plan meeting and what was discussed. we talked about Short-Term Rehab and further planning. We talked about [facility] and if they will take Daisy. Parker back as she has been a resident there for some time. We talked about increasing skill level and concern that she made require more care than an Assisted Living. We talked about potential for Rehab about two more weeks. We talked about preparing for Long-Term Care placement should Daisy Parker come back and say that skill level has increased more than what they're able to provide. We talked about follow up with Renee Pain MDS and social work for updates concerning how many more days of Short-Term Rehab are available in addition to completing fl2 form. Medical goals of care discuss and will mail a blank MOST form and Hard Choice choice book to Daisy Parker. MOST form has previously been completed with wishes to be a full code. [name] and  I discussed at length that with progression of dementia come up progression of disease looking at quality of life, suffering verses comfort. Will remain a full code at present time. We talked role of palliative care and follow-up visit in one week. Therapeutic listening, emotional support provided. Questions answered. I updated Renee Pain MDS coordinator with concerns about Assisted Living versus Long-Term Care, Short-Term Rehab, and insurance. Discuss with Anderson Malta as previous discussion with [name] concerning option of Psychiatry and psychologist for further  counseling while at short-term rehab. No other changes to current plan of care at present time.  Palliative Care was asked to help to continue to address goals of care.   CODE STATUS: Full code  PPS: 40% HOSPICE ELIGIBILITY/DIAGNOSIS: TBD  PAST MEDICAL HISTORY:  Past Medical History:  Diagnosis Date  . Acute lower UTI 05/17/2019  . AKI (acute kidney injury) ([place]) 05/16/2018  . Anemia   . Bradycardia   . Cataract   . CKD (chronic kidney disease) 05/14/2018  . CKD (chronic kidney disease), stage IV ([place])   . COVID-19 virus infection 05/15/2019  . Dementia ([place])   . DNR (do not resuscitate) discussion   . Duodenal perforation ([place])   . Encephalopathy acute 05/14/2018  . HTN (hypertension) 05/14/2018  . Hypertension   . Hypothyroidism   . Mitral valve mass   . Palliative care by specialist   . Pneumonia due to COVID-19 virus 05/15/2019  . RBBB   . Second degree AV block, Mobitz type I   . Second degree AV block, Mobitz type II   . Syncope   . Urinary frequency 03/13/2019    SOCIAL HX:  Social History   Tobacco Use  . Smoking status: Former Research scientist (life sciences)  . Smokeless tobacco: Never Used  Substance Use Topics  . Alcohol use: Never    ALLERGIES:  Allergies  Allergen Reactions  . Codeine Anaphylaxis  . Sulfa Antibiotics Anaphylaxis     PERTINENT MEDICATIONS:  Outpatient Encounter Medications as of 07/10/2019  Medication Sig  .  acetaminophen (TYLENOL) 500 MG tablet Take 1,000 mg by mouth 3 (three) times daily. 0800, 1400, 2000.  Marland Kitchen allopurinol (ZYLOPRIM) 100 MG tablet Take 100 mg by mouth daily.  Marland Kitchen amLODipine (NORVASC) 10 MG tablet Take 1 tablet (10 mg total) by mouth at bedtime.  . brimonidine (ALPHAGAN) 0.2 % ophthalmic solution Place 1 drop into both eyes 2 (two) times daily.  . carvedilol (COREG) 6.25 MG tablet Take 1 tablet (6.25 mg total) by mouth 2 (two) times daily with a meal.  . Cholecalciferol (VITAMIN D) 50 MCG (2000 UT) tablet Take 2,000 Units by mouth daily.  . diclofenac  Sodium (VOLTAREN) 1 % GEL Apply 2 g topically 3 (three) times daily. Applied to knees, hips, and lower back.  . divalproex (DEPAKOTE SPRINKLE) 125 MG capsule Take 125 mg by mouth 2 (two) times daily.  . fluticasone (FLONASE) 50 MCG/ACT nasal spray Place 1 spray into both nostrils daily.  Marland Kitchen latanoprost (XALATAN) 0.005 % ophthalmic solution Place 1 drop into both eyes at bedtime.  Marland Kitchen levothyroxine (SYNTHROID) 150 MCG tablet Take 150 mcg by mouth daily before breakfast.  . Multiple Vitamins-Iron (DAILY VITE MULTIVITAMIN/IRON) TABS Take 1 tablet by mouth daily.  Marland Kitchen OLANZapine (ZYPREXA) 7.5 MG tablet Take 7.5 mg by mouth at bedtime.  . pantoprazole (PROTONIX) 40 MG tablet Take 1 tablet (40 mg total) by mouth 2 (two) times daily.  . polyethylene glycol (MIRALAX / GLYCOLAX) packet Take 17 g by mouth daily.  . sertraline (ZOLOFT) 25 MG tablet Take 25 mg by mouth daily.   . sodium bicarbonate 650 MG tablet Take 1 tablet (650 mg total) by mouth 2 (two) times daily.   No facility-administered encounter medications on file as of 07/10/2019.    PHYSICAL EXAM:   General: debilitated, pleasantly oriented to self, female Cardiovascular: regular rate and rhythm Pulmonary: clear ant fields Abdomen: soft, nontender, + bowel sounds Extremities: mild BLE edema, no joint deformities Neurological: w/c dependent  Averee Harb Ihor Gully, NP

## 2019-07-24 ENCOUNTER — Encounter: Payer: Self-pay | Admitting: Nurse Practitioner

## 2019-07-24 ENCOUNTER — Other Ambulatory Visit: Payer: Self-pay

## 2019-07-24 ENCOUNTER — Non-Acute Institutional Stay: Payer: Medicare Other | Admitting: Nurse Practitioner

## 2019-07-24 VITALS — BP 132/74 | HR 72 | Temp 97.6°F | Resp 18 | Wt 177.0 lb

## 2019-07-24 DIAGNOSIS — Z515 Encounter for palliative care: Secondary | ICD-10-CM

## 2019-07-24 DIAGNOSIS — F0390 Unspecified dementia without behavioral disturbance: Secondary | ICD-10-CM

## 2019-07-24 NOTE — Progress Notes (Signed)
[facility] Consult Note Telephone: [phone]  Fax: [phone]  PATIENT NAME: Daisy Parker DOB: [birth year] MRN: [id number]  PRIMARY CARE PROVIDER:Dr Yves Dill PROVIDER:Dr Hodges/[place] daughter [phone]  RECOMMENDATIONS and PLAN: 1.GLO:VFIE codediscussed with Daisy Parker received Hard Choice book, agreeable to rethink code status and will revisit at next [facility] visit or sooner if Daisy Parker wishes to change code status  2.Palliative care encounter; Palliative medicine team will continue to support patient, patient's family, and medical team. Visit consisted of counseling and education dealing with the complex and emotionally intense issues of symptom management and palliative care in the setting of serious and potentially life-threatening illness  I spent 65 minutes providing this consultation,  from 11:25am to 12:30pm. More than 50% of the time in this consultation was spent coordinating communication.   HISTORY OF PRESENT ILLNESS:  Daisy Parker is a 79 y.o. 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 Hospitalized 11 / 27 / 2020 at [facility] for syncope at [place]. Workout significant to dementia, concern for UTI and dehydration with decreased oral intake. 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 E coli UTI. Palliative care consult during this hospitalization, most form was introduced but not completed with daughter. Remained full code. Hospitalized 2/3 / 2021 to 2 / 9 / 2021 from [facility] for covid positive on 1/20 / 2021 with elevated inflammation markers and  UTI receiving antibiotics. Acute hypoxic respiratory failure secondary to Covid pneumonia. Acute kidney injury on chronic kidney disease with acute metabolic acidosis. Hospitalized 3/4 / 2021 to 3 / 9 / 2021 for anemia found out hemoglobin 6.6 at SNF with hemoccult positive and received two units of packed red blood cells with EGD consulted. 3 / 6 / 2021 hemoglobin stable with EGD prep prep no colonoscopy. Daisy Parker continues to reside in [place] at [facility]. Staff endorses Daisy Parker has been reluctant to work with therapy. It has been very difficult to get her to participate as she only will work with one therapist. Therapy endorses that she does at times refuse therapy. Daisy. Parker does continue to transfer to the wheelchair and is able to sit up on their own. Daisy. Parker does require staff ADL dependent. Daisy Parker has had a recent fall with no noted injury. Daisy Parker does feed herself and appetite has continued to vary. I visited and observe Daisy Parker. We talked about purpose of palliative care visit. Daisy Parker was confused today. Daisy. Parker did answer some questions such as symptoms denying pain or shortness of breath. Daisy. Parker was confused about place, time and events. Daisy Parker was cooperative with assessment. Emotional support provided. I called Daisy Parker since daughter Daisy Parker for update on palliative care visit. We talked about palliative care visit with Daisy Parker. We talked about chronic disease progression. We talked about Assisted Living where she previously resided came to evaluate, skill level was more than what they could care for. We talked about the importance of getting with the business office at [facility] and finding out how many more days of Short-Term Rehab she would have and if she would be eligible to stay for [place]. We  talked about if she was not able to stay at [facility] for Long-Term bed then will need to look at  another Long-Term Care facility. We talked about role of palliative care and that can follow whichever facility she is transferred to. Stressed to Daisy Parker importance of contacting business office and Education officer, museum for further planning as far as once therapy has stop. We talked about her overall declining condition. We talked about her Psychiatric history. We reviewed Depakote levels. We talked about those set therapeutic noted for behaviors may not always be within range. We talked about to noted behaviors documented in nursing notes one week apart. We talked about option of psychotherapy for which Daisy Parker is very interested in having Daisy Parker participate in. Notified Psychiatry, will add Psychotherapy. We talked catastrophic events with recent hospitalization being in a new facility, new staff limited visitation chronic disease progression has been a lot for Daisy Parker. We talked about likely she is at her new baseline where she likely ambulate. Daisy. Parker did ambulate a few steps with therapy but then does refuse therapy and refuses to ambulate. With debility, ongoing refusal and wishing to be more sedentary. We talked about realistic expectations for Daisy Parker. We talked about decline overall. We talked about medical goals of care. We talked about Daisy. Parker currently remains a full code. Daisy Parker endorses that she did receive Hard Choice book and blank most form for review. Daisy Parker endorses she did start reviewing our choice book but has not completed and would like some time to really think about code status. We talked about follow-up palliative care visit and two weeks if needed or sooner should she declined. Discuss that if Daisy Parker wishes to change from full code to DNR to contact and would be able to complete [place] form. Daisy Parker in agreement. Therapeutic listening and emotional support provided. Contact information. Questions answered to satisfaction. I have updated nursing staff. No new changes to  current goals or plan of care.  Palliative Care was asked to help to continue to address goals of care.   CODE STATUS: Full code  PPS: 40% HOSPICE ELIGIBILITY/DIAGNOSIS: TBD  PAST MEDICAL HISTORY:  Past Medical History:  Diagnosis Date  . Acute lower UTI 05/17/2019  . AKI (acute kidney injury) ([place]) 05/16/2018  . Anemia   . Bradycardia   . Cataract   . CKD (chronic kidney disease) 05/14/2018  . CKD (chronic kidney disease), stage IV ([place])   . COVID-19 virus infection 05/15/2019  . Dementia ([place])   . DNR (do not resuscitate) discussion   . Duodenal perforation ([place])   . Encephalopathy acute 05/14/2018  . HTN (hypertension) 05/14/2018  . Hypertension   . Hypothyroidism   . Mitral valve mass   . Palliative care by specialist   . Pneumonia due to COVID-19 virus 05/15/2019  . RBBB   . Second degree AV block, Mobitz type I   . Second degree AV block, Mobitz type II   . Syncope   . Urinary frequency 03/13/2019    SOCIAL HX:  Social History   Tobacco Use  . Smoking status: Former Research scientist (life sciences)  . Smokeless tobacco: Never Used  Substance Use Topics  . Alcohol use: Never    ALLERGIES:  Allergies  Allergen Reactions  . Codeine Anaphylaxis  . Sulfa Antibiotics Anaphylaxis     PERTINENT MEDICATIONS:  Outpatient Encounter Medications as of 07/24/2019  Medication Sig  . acetaminophen (TYLENOL) 500 MG tablet Take 1,000 mg by mouth 3 (three) times daily.  0800, 1400, 2000.  Marland Kitchen allopurinol (ZYLOPRIM) 100 MG tablet Take 100 mg by mouth daily.  Marland Kitchen amLODipine (NORVASC) 10 MG tablet Take 1 tablet (10 mg total) by mouth at bedtime.  . brimonidine (ALPHAGAN) 0.2 % ophthalmic solution Place 1 drop into both eyes 2 (two) times daily.  . carvedilol (COREG) 6.25 MG tablet Take 1 tablet (6.25 mg total) by mouth 2 (two) times daily with a meal.  . Cholecalciferol (VITAMIN D) 50 MCG (2000 UT) tablet Take 2,000 Units by mouth daily.  . diclofenac Sodium (VOLTAREN) 1 % GEL Apply 2 g topically 3 (three) times  daily. Applied to knees, hips, and lower back.  . divalproex (DEPAKOTE SPRINKLE) 125 MG capsule Take 125 mg by mouth 2 (two) times daily.  . fluticasone (FLONASE) 50 MCG/ACT nasal spray Place 1 spray into both nostrils daily.  Marland Kitchen latanoprost (XALATAN) 0.005 % ophthalmic solution Place 1 drop into both eyes at bedtime.  Marland Kitchen levothyroxine (SYNTHROID) 150 MCG tablet Take 150 mcg by mouth daily before breakfast.  . Multiple Vitamins-Iron (DAILY VITE MULTIVITAMIN/IRON) TABS Take 1 tablet by mouth daily.  Marland Kitchen OLANZapine (ZYPREXA) 7.5 MG tablet Take 7.5 mg by mouth at bedtime.  . pantoprazole (PROTONIX) 40 MG tablet Take 1 tablet (40 mg total) by mouth 2 (two) times daily.  . polyethylene glycol (MIRALAX / GLYCOLAX) packet Take 17 g by mouth daily.  . sertraline (ZOLOFT) 25 MG tablet Take 25 mg by mouth daily.   . sodium bicarbonate 650 MG tablet Take 1 tablet (650 mg total) by mouth 2 (two) times daily.   No facility-administered encounter medications on file as of 07/24/2019.    PHYSICAL EXAM:   General: NAD, debilitated, chronically ill female, confused Cardiovascular: regular rate and rhythm Pulmonary: clear ant fields Extremities: no edema, no joint deformities Neurological: w/c dependent   Ihor Gully, NP

## 2019-07-30 ENCOUNTER — Telehealth: Payer: Self-pay | Admitting: Nurse Practitioner

## 2019-07-30 NOTE — Telephone Encounter (Signed)
Daisy Parker left a message, Daisy Parker's daughter for update that Daisy Parker [facility]F are that they are going to do another re-evaluation concerning her skill level to see if she would be eligible to return to [facility]. I attempted to call Daisy Parker for further discussion, no answer, message left with contact information.

## 2019-08-21 ENCOUNTER — Non-Acute Institutional Stay: Payer: Medicare Other | Admitting: Nurse Practitioner

## 2019-08-21 ENCOUNTER — Other Ambulatory Visit: Payer: Self-pay

## 2019-08-21 ENCOUNTER — Encounter: Payer: Self-pay | Admitting: Nurse Practitioner

## 2019-08-21 DIAGNOSIS — Z515 Encounter for palliative care: Secondary | ICD-10-CM

## 2019-08-21 DIAGNOSIS — F0390 Unspecified dementia without behavioral disturbance: Secondary | ICD-10-CM

## 2019-08-21 NOTE — Progress Notes (Addendum)
[place] Consult Note Telephone: [phone]  Fax: [phone]  PATIENT NAME: Daisy Parker DOB: 04/19/1940 MRN: [id number]  PRIMARY CARE PROVIDER:Dr Yves Dill PROVIDER:Dr Hodges/[place] daughter [phone]  RECOMMENDATIONS and PLAN: 1.WLN:LGXQ codediscussed with Daisy Parker Wallie Renshaw received Hard Choice book, agreeable to rethink code status and will revisit at next [facility] visit or sooner if Daisy Parker wishes to change code status  2.Palliative care encounter; Palliative medicine team will continue to support patient, patient's family, and medical team. Visit consisted of counseling and education dealing with the complex and emotionally intense issues of symptom management and palliative care in the setting of serious and potentially life-threatening illness  I spent 60 minutes providing this consultation,  from 2:00pm. More than 50% of the time in this consultation was spent coordinating communication.   HISTORY OF PRESENT ILLNESS:  Daisy Parker is a 79 y.o. 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 Hospitalized 11 / 27 / 2020 at [facility] for syncope at [place]. Workout significant to dementia, concern for UTI and dehydration with decreased oral intake. 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 E coli UTI. Palliative care consult during this hospitalization, most form was introduced but not completed with daughter. Remained full code. Hospitalized 2/3 / 2021 to 2 / 9 / 2021 from [facility] for covid positive on 1/20 / 2021 with elevated inflammation markers and UTI receiving  antibiotics. Acute hypoxic respiratory failure secondary to Covid pneumonia. Acute kidney injury on chronic kidney disease with acute metabolic acidosis. Hospitalized 3/4 / 2021 to 3 / 9 / 2021 for anemia found out hemoglobin 6.6 at SNF with hemoccult positive and received two units of packed red blood cells with EGD consulted. 3 / 6 / 2021 hemoglobin stable with EGD prep prep no colonoscopy.  [name] continues to reside at [place] at [facility] when she transitioned from short-term rehab. [name] remains ADL dependent for transferring, adl's, toileting. [name] does feed herself and appetite remains declined. Currently in [name] is being treated for a UTI with antibiotic therapy. Staff endorses [name] is sleeping more. At present Ms. Schiano is lying in bed asleep. Ms. Martelle does a week to verbal cues. Ms. Hofferber does appear debilitated but comfortable. No visitors present. I visited and observe [name]. [name] does make eye contact the verbal cues and answer questions so does have some confusion. [name] denies symptoms of pain or shortness of breath. Ms. Capell endorses that she is just very tired. We talked about her UTI. We talked about her functional level and getting out of bed. [name] endorses she just does not feel like it. [name] endorses she is tired and just wants to sleep. Ms. Salada was cooperative with assessment. Ms. Beitzel did go back to sleep during palliative care visit. Emotional support provided. Will call daughter Daisy Parker follow up palliative visit. No new changes that present time to goals of care, medical goals reviewed and will continue. Palliative Care was asked to help to continue to address goals of care.   I called Daisy Rowan, Ms Safran daughter for update on palliative care visit. We talked about palliative care visit  with [name]. We talked about [name] endorses she is feeling today,  tired. We talked about recent UTI with antibiotic therapy. We talked about functional level getting out of bed. We talked about progression of chronic disease. We talked about realistic expectations. We talked about [name] currently transitioning to Long-Term care. We talked about seeing him as [name] does in a few days with UTI with follow-up palliative care visit. Brend in agreement. Therapeutic listening, emotional support provided. Talked about medical goals of care. Questions answered is satisfaction. Contact information provided.   CODE STATUS: Full code  PPS: 40% HOSPICE ELIGIBILITY/DIAGNOSIS: TBD  PAST MEDICAL HISTORY:  Past Medical History:  Diagnosis Date  . Acute lower UTI 05/17/2019  . AKI (acute kidney injury) ([place]) 05/16/2018  . Anemia   . Bradycardia   . Cataract   . CKD (chronic kidney disease) 05/14/2018  . CKD (chronic kidney disease), stage IV ([place])   . COVID-19 virus infection 05/15/2019  . Dementia ([place])   . DNR (do not resuscitate) discussion   . Duodenal perforation ([place])   . Encephalopathy acute 05/14/2018  . HTN (hypertension) 05/14/2018  . Hypertension   . Hypothyroidism   . Mitral valve mass   . Palliative care by specialist   . Pneumonia due to COVID-19 virus 05/15/2019  . RBBB   . Second degree AV block, Mobitz type I   . Second degree AV block, Mobitz type II   . Syncope   . Urinary frequency 03/13/2019    SOCIAL HX:  Social History   Tobacco Use  . Smoking status: Former Research scientist (life sciences)  . Smokeless tobacco: Never Used  Substance Use Topics  . Alcohol use: Never    ALLERGIES:  Allergies  Allergen Reactions  . Codeine Anaphylaxis  . Sulfa Antibiotics Anaphylaxis     PERTINENT MEDICATIONS:  Outpatient Encounter Medications as of 08/21/2019  Medication Sig  . acetaminophen (TYLENOL) 500 MG tablet Take 1,000 mg by mouth 3 (three) times daily. 0800, 1400, 2000.  Marland Kitchen allopurinol (ZYLOPRIM) 100 MG tablet Take 100 mg by mouth daily.  Marland Kitchen amLODipine (NORVASC) 10  MG tablet Take 1 tablet (10 mg total) by mouth at bedtime.  . brimonidine (ALPHAGAN) 0.2 % ophthalmic solution Place 1 drop into both eyes 2 (two) times daily.  . carvedilol (COREG) 6.25 MG tablet Take 1 tablet (6.25 mg total) by mouth 2 (two) times daily with a meal.  . Cholecalciferol (VITAMIN D) 50 MCG (2000 UT) tablet Take 2,000 Units by mouth daily.  . diclofenac Sodium (VOLTAREN) 1 % GEL Apply 2 g topically 3 (three) times daily. Applied to knees, hips, and lower back.  . divalproex (DEPAKOTE SPRINKLE) 125 MG capsule Take 125 mg by mouth 2 (two) times daily.  . fluticasone (FLONASE) 50 MCG/ACT nasal spray Place 1 spray into both nostrils daily.  Marland Kitchen latanoprost (XALATAN) 0.005 % ophthalmic solution Place 1 drop into both eyes at bedtime.  Marland Kitchen levothyroxine (SYNTHROID) 150 MCG tablet Take 150 mcg by mouth daily before breakfast.  . Multiple Vitamins-Iron (DAILY VITE MULTIVITAMIN/IRON) TABS Take 1 tablet by mouth daily.  Marland Kitchen OLANZapine (ZYPREXA) 7.5 MG tablet Take 7.5 mg by mouth at bedtime.  . pantoprazole (PROTONIX) 40 MG tablet Take 1 tablet (40 mg total) by mouth 2 (two) times daily.  . polyethylene glycol (MIRALAX / GLYCOLAX) packet Take 17 g by mouth daily.  . sertraline (ZOLOFT) 25 MG tablet Take 25 mg by mouth daily.   . sodium bicarbonate 650 MG tablet Take 1  tablet (650 mg total) by mouth 2 (two) times daily.   No facility-administered encounter medications on file as of 08/21/2019.    PHYSICAL EXAM:   General: debility, confused chronically ill female Cardiovascular: regular rate and rhythm Pulmonary: clear ant fields Neurological: functionally quadriplegic Berel Najjar Ihor Gully, NP

## 2019-08-23 ENCOUNTER — Encounter: Payer: Self-pay | Admitting: Nurse Practitioner

## 2019-08-23 ENCOUNTER — Other Ambulatory Visit: Payer: Self-pay

## 2019-08-23 ENCOUNTER — Non-Acute Institutional Stay: Payer: Medicare Other | Admitting: Nurse Practitioner

## 2019-08-23 DIAGNOSIS — Z515 Encounter for palliative care: Secondary | ICD-10-CM

## 2019-08-23 DIAGNOSIS — F0390 Unspecified dementia without behavioral disturbance: Secondary | ICD-10-CM

## 2019-08-23 NOTE — Progress Notes (Signed)
[place] Consult Note Telephone: [phone]  Fax: [phone]  PATIENT NAME: Daisy Parker DOB: 1940-10-18 MRN: [id number]  PRIMARY CARE PROVIDER:Dr Yves Dill PROVIDER:Dr Hodges/[place] daughter [phone]  RECOMMENDATIONS and PLAN: 1.YBW:LSLH codediscussed with [name] received Hard Choice book, agreeable to rethink code status and will revisit at next [facility] visit or sooner if [name] wishes to change code status  2.Palliative care encounter; Palliative medicine team will continue to support patient, patient's family, and medical team. Visit consisted of counseling and education dealing with the complex and emotionally intense issues of symptom management and palliative care in the setting of serious and potentially life-threatening illness  I spent 60 minutes providing this consultation,  At 1:30pm. More than 50% of the time in this consultation was spent coordinating communication.   HISTORY OF PRESENT ILLNESS:  Daisy Parker is a 79 y.o. 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 Hospitalized 11 / 27 / 2020 at [facility] for syncope at [place]. Workout significant to dementia, concern for UTI and dehydration with decreased oral intake. 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 E coli UTI. Palliative care consult during this hospitalization, most form was introduced but not completed with daughter. Remained full code. Hospitalized 2/3 / 2021 to 2 / 9 / 2021 from [facility] for covid positive on 1/20 / 2021 with elevated inflammation markers and UTI receiving  antibiotics. Acute hypoxic respiratory failure secondary to Covid pneumonia. Acute kidney injury on chronic kidney disease with acute metabolic acidosis. Hospitalized 3/4 / 2021 to 3 / 9 / 2021 for anemia found out hemoglobin 6.6 at SNF with hemoccult positive and received two units of packed red blood cells with EGD consulted. 3 / 6 / 2021 hemoglobin stable with EGD prep prep no colonoscopy.[name] continues to reside in [place] at [facility]. Staff endorses [name] is doing a little better today. [name] has been eating. [name] appetite has increased and [name] is not as lethargic [name] does feed herself and appetite has been improving. Staff endorses no new changes are concerns. At present [name] is sitting up in bed, feeding herself. [name] appears comfortable, no visitors present. I visited and observed [name]. Ms. Pho made eye contact, was verbally interactive. [name] did answer questions. We talked about how she was feeling today. [name] endorses that she is feeling better. Her appetite is getting better. We talked about mobility and getting out of bed. [name] endorses she was comfortable where she was. We talked about UTI in antibiotics. Limited discussion with cognitive impairment. Emotional support provided. I updated nursing staff no new changes to goals or plan of care. I called [name], Ms. Malkowski daughter for update on PC visit, no changes,questions answered Palliative Care was asked to help to continue to address goals of care.   CODE STATUS: Full code  PPS: 40% HOSPICE ELIGIBILITY/DIAGNOSIS: TBD  PAST MEDICAL HISTORY:  Past Medical History:  Diagnosis Date  . Acute lower UTI 05/17/2019  . AKI (acute kidney injury) ([place]) 05/16/2018  . Anemia   . Bradycardia   . Cataract   . CKD (chronic kidney disease)  05/14/2018  . CKD (chronic kidney disease), stage IV ([place])   . COVID-19 virus infection  05/15/2019  . Dementia ([place])   . DNR (do not resuscitate) discussion   . Duodenal perforation ([place])   . Encephalopathy acute 05/14/2018  . HTN (hypertension) 05/14/2018  . Hypertension   . Hypothyroidism   . Mitral valve mass   . Palliative care by specialist   . Pneumonia due to COVID-19 virus 05/15/2019  . RBBB   . Second degree AV block, Mobitz type I   . Second degree AV block, Mobitz type II   . Syncope   . Urinary frequency 03/13/2019    SOCIAL HX:  Social History   Tobacco Use  . Smoking status: Former Research scientist (life sciences)  . Smokeless tobacco: Never Used  Substance Use Topics  . Alcohol use: Never    ALLERGIES:  Allergies  Allergen Reactions  . Codeine Anaphylaxis  . Sulfa Antibiotics Anaphylaxis     PERTINENT MEDICATIONS:  Outpatient Encounter Medications as of 08/23/2019  Medication Sig  . acetaminophen (TYLENOL) 500 MG tablet Take 1,000 mg by mouth 3 (three) times daily. 0800, 1400, 2000.  Marland Kitchen allopurinol (ZYLOPRIM) 100 MG tablet Take 100 mg by mouth daily.  Marland Kitchen amLODipine (NORVASC) 10 MG tablet Take 1 tablet (10 mg total) by mouth at bedtime.  . brimonidine (ALPHAGAN) 0.2 % ophthalmic solution Place 1 drop into both eyes 2 (two) times daily.  . carvedilol (COREG) 6.25 MG tablet Take 1 tablet (6.25 mg total) by mouth 2 (two) times daily with a meal.  . Cholecalciferol (VITAMIN D) 50 MCG (2000 UT) tablet Take 2,000 Units by mouth daily.  . diclofenac Sodium (VOLTAREN) 1 % GEL Apply 2 g topically 3 (three) times daily. Applied to knees, hips, and lower back.  . divalproex (DEPAKOTE SPRINKLE) 125 MG capsule Take 125 mg by mouth 2 (two) times daily.  . fluticasone (FLONASE) 50 MCG/ACT nasal spray Place 1 spray into both nostrils daily.  Marland Kitchen latanoprost (XALATAN) 0.005 % ophthalmic solution Place 1 drop into both eyes at bedtime.  Marland Kitchen levothyroxine (SYNTHROID) 150 MCG tablet Take 150 mcg by mouth daily before breakfast.  . Multiple Vitamins-Iron (DAILY VITE MULTIVITAMIN/IRON) TABS Take 1 tablet  by mouth daily.  Marland Kitchen OLANZapine (ZYPREXA) 7.5 MG tablet Take 7.5 mg by mouth at bedtime.  . pantoprazole (PROTONIX) 40 MG tablet Take 1 tablet (40 mg total) by mouth 2 (two) times daily.  . polyethylene glycol (MIRALAX / GLYCOLAX) packet Take 17 g by mouth daily.  . sertraline (ZOLOFT) 25 MG tablet Take 25 mg by mouth daily.   . sodium bicarbonate 650 MG tablet Take 1 tablet (650 mg total) by mouth 2 (two) times daily.   No facility-administered encounter medications on file as of 08/23/2019.    PHYSICAL EXAM:   General: NAD, obese, pleasantly confused, female Cardiovascular: regular rate and rhythm Pulmonary: clear ant fields Neurological: non-ambulatory  Braden Deloach Ihor Gully, NP

## 2019-08-29 ENCOUNTER — Telehealth: Payer: Self-pay | Admitting: Nurse Practitioner

## 2019-08-29 NOTE — Telephone Encounter (Signed)
I returned Daisy Parker phone call about her mom [name]. Friend it talks about update with care plan discussion. We talked about Cardiology appointment and upcoming pacemaker check. We talked about staff update that [place] received. We talked about disease progression. Discuss that will follow up and continue to Monitor and follow and palliative care , see  tomorrow. [name] in agreement. Therapeutic listening and emotional support provided. Questions answered.  Total time spent 25 minutes  Documentation 5 minutes  Phone discussion 25 minutes

## 2019-08-30 ENCOUNTER — Encounter: Payer: Self-pay | Admitting: Nurse Practitioner

## 2019-08-30 ENCOUNTER — Non-Acute Institutional Stay: Payer: Medicare Other | Admitting: Nurse Practitioner

## 2019-08-30 ENCOUNTER — Other Ambulatory Visit: Payer: Self-pay

## 2019-08-30 VITALS — BP 132/75 | HR 88 | Temp 97.0°F | Resp 18 | Wt 177.0 lb

## 2019-08-30 DIAGNOSIS — Z515 Encounter for palliative care: Secondary | ICD-10-CM

## 2019-08-30 DIAGNOSIS — F0390 Unspecified dementia without behavioral disturbance: Secondary | ICD-10-CM

## 2019-08-30 NOTE — Telephone Encounter (Signed)
Merlin monitor is up to date as of 08/30/19.

## 2019-08-30 NOTE — Progress Notes (Signed)
Daisy Parker Consult Note Telephone: [phone]  Fax: [phone]  PATIENT NAME: Daisy Parker DOB: [birth year] MRN: [id number]  PRIMARY CARE PROVIDER:Dr Yves Dill PROVIDER:Dr Hodges/[place] daughter [phone]  RECOMMENDATIONS and PLAN: 1.ZYS:AYTK coderevisited; Daisy Parker received Hard Choice book, agreeable to rethink code status and will revisit at next [facility] visit or sooner if Daisy Parker wishes to change code status  2.Palliative care encounter; Palliative medicine team will continue to support patient, patient's family, and medical team. Visit consisted of counseling and education dealing with the complex and emotionally intense issues of symptom management and palliative care in the setting of serious and potentially life-threatening illness  I spent 65 minutes providing this consultation,  Starting at 10:30am. More than 50% of the time in this consultation was spent coordinating communication.   HISTORY OF PRESENT ILLNESS:  Daisy Parker is a 79 y.o. 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 Hospitalized 11 / 27 / 2020 at [facility] for syncope at [place]. Workout significant to dementia, concern for UTI and dehydration with decreased oral intake. 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 E coli UTI. Palliative care consult during this hospitalization, most form was introduced but not completed with daughter. Remained full code. Hospitalized 2/3 / 2021 to 2 / 9 / 2021 from [facility] for covid positive on 1/20 / 2021 with elevated inflammation markers and UTI receiving antibiotics.  Acute hypoxic respiratory failure secondary to Covid pneumonia. Acute kidney injury on chronic kidney disease with acute metabolic acidosis. Hospitalized 3/4 / 2021 to 3 / 9 / 2021 for anemia found out hemoglobin 6.6 at SNF with hemoccult positive and received two units of packed red blood cells with EGD consulted. 3 / 6 / 2021 hemoglobin stable with EGD prep prep no colonoscopy. Daisy Parker continues to reside in [place] at [facility], total ADL dependent for transferring, mobility, toileting. Daisy Parker does feed herself with staff assistance. Daisy. Parker ate 100% of her meal today. Staff endorses she seems to be improving slowly though cognition remains fairly confused. Lovey Newcomer, Daisy Parker daughter follow up palliative care visit today. At present Daisy Parker is lying in bed, appears comfortable, confused, no distress. No visitors present. I visited and observe nastasen. Daisy. Parker does make eye contact with verbal cues. She does attempt to answer questions though answers do not make sense. Daisy. Parker was unable to verbalize where she was or what she had to eat but it was good. Daisy. Parker cooperative with [place] assessment. Emotional support provided. Encourage Daisy. Merten to get out of bed. I called Daisy Parker, update on palliative care visit. We talked about current clinical condition worsening confusion with chronic disease progression of dementia. Discuss the new changes at present Time. Daisy Parker endorses she is planning on visiting tomorrow at she is trying to clean out her room at [facility]. We talked about palliative care next visit in three weeks if needed or soon as she declined. Daisy Parker in agreement and any changes recommended. Medical goals reviews. Updated nursing staff any changes to current goals or plan of care. Palliative Care was asked to help to continue to address goals of care.   CODE STATUS: Full code  PPS:  40% HOSPICE ELIGIBILITY/DIAGNOSIS: TBD  PAST  MEDICAL HISTORY:  Past Medical History:  Diagnosis Date  . Acute lower UTI 05/17/2019  . AKI (acute kidney injury) ([place]) 05/16/2018  . Anemia   . Bradycardia   . Cataract   . CKD (chronic kidney disease) 05/14/2018  . CKD (chronic kidney disease), stage IV ([place])   . COVID-19 virus infection 05/15/2019  . Dementia ([place])   . DNR (do not resuscitate) discussion   . Duodenal perforation ([place])   . Encephalopathy acute 05/14/2018  . HTN (hypertension) 05/14/2018  . Hypertension   . Hypothyroidism   . Mitral valve mass   . Palliative care by specialist   . Pneumonia due to COVID-19 virus 05/15/2019  . RBBB   . Second degree AV block, Mobitz type I   . Second degree AV block, Mobitz type II   . Syncope   . Urinary frequency 03/13/2019    SOCIAL HX:  Social History   Tobacco Use  . Smoking status: Former Research scientist (life sciences)  . Smokeless tobacco: Never Used  Substance Use Topics  . Alcohol use: Never    ALLERGIES:  Allergies  Allergen Reactions  . Codeine Anaphylaxis  . Sulfa Antibiotics Anaphylaxis     PERTINENT MEDICATIONS:  Outpatient Encounter Medications as of 08/30/2019  Medication Sig  . acetaminophen (TYLENOL) 500 MG tablet Take 1,000 mg by mouth 3 (three) times daily. 0800, 1400, 2000.  Marland Kitchen allopurinol (ZYLOPRIM) 100 MG tablet Take 100 mg by mouth daily.  Marland Kitchen amLODipine (NORVASC) 10 MG tablet Take 1 tablet (10 mg total) by mouth at bedtime.  . brimonidine (ALPHAGAN) 0.2 % ophthalmic solution Place 1 drop into both eyes 2 (two) times daily.  . carvedilol (COREG) 6.25 MG tablet Take 1 tablet (6.25 mg total) by mouth 2 (two) times daily with a meal.  . Cholecalciferol (VITAMIN D) 50 MCG (2000 UT) tablet Take 2,000 Units by mouth daily.  . diclofenac Sodium (VOLTAREN) 1 % GEL Apply 2 g topically 3 (three) times daily. Applied to knees, hips, and lower back.  . divalproex (DEPAKOTE SPRINKLE) 125 MG capsule Take 125 mg by mouth 2 (two) times daily.  . fluticasone (FLONASE) 50 MCG/ACT nasal spray Place  1 spray into both nostrils daily.  Marland Kitchen latanoprost (XALATAN) 0.005 % ophthalmic solution Place 1 drop into both eyes at bedtime.  Marland Kitchen levothyroxine (SYNTHROID) 150 MCG tablet Take 150 mcg by mouth daily before breakfast.  . Multiple Vitamins-Iron (DAILY VITE MULTIVITAMIN/IRON) TABS Take 1 tablet by mouth daily.  Marland Kitchen OLANZapine (ZYPREXA) 7.5 MG tablet Take 7.5 mg by mouth at bedtime.  . pantoprazole (PROTONIX) 40 MG tablet Take 1 tablet (40 mg total) by mouth 2 (two) times daily.  . polyethylene glycol (MIRALAX / GLYCOLAX) packet Take 17 g by mouth daily.  . sertraline (ZOLOFT) 25 MG tablet Take 25 mg by mouth daily.   . sodium bicarbonate 650 MG tablet Take 1 tablet (650 mg total) by mouth 2 (two) times daily.   No facility-administered encounter medications on file as of 08/30/2019.    PHYSICAL EXAM:   General: NAD, obese, pleasantly confused Cardiovascular: regular rate and rhythm Pulmonary: clear ant fields Neurological: nonambulatory  Willey Due Ihor Gully, NP

## 2019-09-10 ENCOUNTER — Ambulatory Visit (INDEPENDENT_AMBULATORY_CARE_PROVIDER_SITE_OTHER): Payer: Medicare Other | Admitting: *Deleted

## 2019-09-10 DIAGNOSIS — I441 Atrioventricular block, second degree: Secondary | ICD-10-CM

## 2019-09-10 LAB — CUP PACEART REMOTE DEVICE CHECK
Battery Remaining Longevity: 101 mo
Battery Remaining Percentage: 95.5 %
Battery Voltage: 3.01 V
Brady Statistic AP VP Percent: 58 %
Brady Statistic AP VS Percent: 0 %
Brady Statistic AS VP Percent: 42 %
Brady Statistic AS VS Percent: 0 %
Brady Statistic RA Percent Paced: 1 %
Brady Statistic RV Percent Paced: 99 %
Date Time Interrogation Session: 20210601020012
Implantable Lead Implant Date: 20201130
Implantable Lead Implant Date: 20201130
Implantable Lead Location: 753859
Implantable Lead Location: 753860
Implantable Pulse Generator Implant Date: 20201130
Lead Channel Impedance Value: 350 Ohm
Lead Channel Impedance Value: 590 Ohm
Lead Channel Pacing Threshold Amplitude: 0.375 V
Lead Channel Pacing Threshold Amplitude: 0.5 V
Lead Channel Pacing Threshold Pulse Width: 0.5 ms
Lead Channel Pacing Threshold Pulse Width: 0.5 ms
Lead Channel Sensing Intrinsic Amplitude: 2.2 mV
Lead Channel Sensing Intrinsic Amplitude: 9 mV
Lead Channel Setting Pacing Amplitude: 0.625
Lead Channel Setting Pacing Amplitude: 3.5 V
Lead Channel Setting Pacing Pulse Width: 0.5 ms
Lead Channel Setting Sensing Sensitivity: 2 mV
Pulse Gen Model: 2272
Pulse Gen Serial Number: 9182735

## 2019-09-11 NOTE — Progress Notes (Signed)
Remote pacemaker transmission.   

## 2019-10-11 ENCOUNTER — Non-Acute Institutional Stay: Payer: Medicare Other | Admitting: Nurse Practitioner

## 2019-10-11 ENCOUNTER — Encounter: Payer: Self-pay | Admitting: Nurse Practitioner

## 2019-10-11 VITALS — Wt 177.0 lb

## 2019-10-11 DIAGNOSIS — F0390 Unspecified dementia without behavioral disturbance: Secondary | ICD-10-CM

## 2019-10-11 DIAGNOSIS — Z515 Encounter for palliative care: Secondary | ICD-10-CM

## 2019-10-11 NOTE — Progress Notes (Signed)
[facility] Consult Note Telephone: [phone]  Fax: [phone]  PATIENT NAME: Daisy Parker DOB: [birth year] MRN: [id number] PRIMARY CARE PROVIDER:Dr Yves Dill PROVIDER:Dr Hodges/[place] daughter [phone]  RECOMMENDATIONS and PLAN: 1.TKZ:SWFU coderevisited will revisit medical goals of care with daughter, [name]  2.Palliative care encounter; Palliative medicine team will continue to support patient, patient's family, and medical team. Visit consisted of counseling and education dealing with the complex and emotionally intense issues of symptom management and palliative care in the setting of serious and potentially life-threatening illness  I spent 50 minutes providing this consultation, starting at 1:30pm. More than 50% of the time in this consultation was spent coordinating communication  HISTORY OF PRESENT ILLNESS:  Shabreka Coulon is a 79 y.o. 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 Ms. Thoreson continue to reside in [place] at [facility]. Ms Pitstick dependent on staff for adl's, transferring, mobility, toileting. Staff endorses Ms Duignan has been becoming more forgetful. Ms. Hipp has been sleeping more throughout the day. Ms Kight does require assistance with feeding, tray setup though she is able to feed herself. Ms Constantino current weight is 177.0 pounds with BMI 27.7. Staff endorses no recent wounds, Falls, infections, hospitalizations. Staff endorses no other changes are concerns. At present Ms. Argueta is lying in bed. Ms. Wonnacott is asleep but awoke to verbal cues smiling.  Ms. Neal in appears debilitated, no distress. No visitors present. I visited and observed [name]. We talked about purpose of palliative care visit. Ms Alleyne did make eye contact or verbal cues. Ms. Gootee endorses she is very very tired. Ms. Vernie Shanks was cooperative with assessment smiling, appears content. Limited verbal discussion with cognitive impairment. We talked about symptoms of pain and shortness of breath what she did not realize. Ms. Berko endorses that she is comfortable she just wants to sleep. We talked about mobility and getting out of bed was she politely declined. We talked about her daughter [name] coming to visit with her. Ms. Lievanos did have a lot of difficulty with processing and answering questions. We talked about food but she can not recall what kind of foods that she likes. Emotional support provided. Medical goals reviewed. What contact [name], stay with his daughter for further discussion of medical goals and care including code status. I updated nursing staff and any changes to current goals or plan of care.  Palliative Care was asked to help to continue to address goals of care.   CODE STATUS: Full code  PPS: 30% HOSPICE ELIGIBILITY/DIAGNOSIS: TBD  PAST MEDICAL HISTORY:  Past Medical History:  Diagnosis Date  . Acute lower UTI 05/17/2019  . AKI (acute kidney injury) ([place]) 05/16/2018  . Anemia   . Bradycardia   . Cataract   . CKD (chronic kidney disease) 05/14/2018  . CKD (chronic kidney disease), stage IV ([place])   . COVID-19 virus infection 05/15/2019  . Dementia ([place])   . DNR (do not resuscitate) discussion   . Duodenal perforation ([place])   . Encephalopathy acute 05/14/2018  . HTN (hypertension) 05/14/2018  . Hypertension   . Hypothyroidism   . Mitral valve mass   . Palliative care by specialist   . Pneumonia due to COVID-19 virus 05/15/2019  .  RBBB   . Second degree AV block, Mobitz type I   . Second degree AV block, Mobitz type II   . Syncope   . Urinary  frequency 03/13/2019    SOCIAL HX:  Social History   Tobacco Use  . Smoking status: Former Research scientist (life sciences)  . Smokeless tobacco: Never Used  Substance Use Topics  . Alcohol use: Never    ALLERGIES:  Allergies  Allergen Reactions  . Codeine Anaphylaxis  . Sulfa Antibiotics Anaphylaxis     PERTINENT MEDICATIONS:  Outpatient Encounter Medications as of 10/11/2019  Medication Sig  . acetaminophen (TYLENOL) 500 MG tablet Take 1,000 mg by mouth 3 (three) times daily. 0800, 1400, 2000.  Marland Kitchen allopurinol (ZYLOPRIM) 100 MG tablet Take 100 mg by mouth daily.  Marland Kitchen amLODipine (NORVASC) 10 MG tablet Take 1 tablet (10 mg total) by mouth at bedtime.  . brimonidine (ALPHAGAN) 0.2 % ophthalmic solution Place 1 drop into both eyes 2 (two) times daily.  . carvedilol (COREG) 6.25 MG tablet Take 1 tablet (6.25 mg total) by mouth 2 (two) times daily with a meal.  . Cholecalciferol (VITAMIN D) 50 MCG (2000 UT) tablet Take 2,000 Units by mouth daily.  . diclofenac Sodium (VOLTAREN) 1 % GEL Apply 2 g topically 3 (three) times daily. Applied to knees, hips, and lower back.  . divalproex (DEPAKOTE SPRINKLE) 125 MG capsule Take 125 mg by mouth 2 (two) times daily.  . fluticasone (FLONASE) 50 MCG/ACT nasal spray Place 1 spray into both nostrils daily.  Marland Kitchen latanoprost (XALATAN) 0.005 % ophthalmic solution Place 1 drop into both eyes at bedtime.  Marland Kitchen levothyroxine (SYNTHROID) 150 MCG tablet Take 150 mcg by mouth daily before breakfast.  . Multiple Vitamins-Iron (DAILY VITE MULTIVITAMIN/IRON) TABS Take 1 tablet by mouth daily.  Marland Kitchen OLANZapine (ZYPREXA) 7.5 MG tablet Take 7.5 mg by mouth at bedtime.  . pantoprazole (PROTONIX) 40 MG tablet Take 1 tablet (40 mg total) by mouth 2 (two) times daily.  . polyethylene glycol (MIRALAX / GLYCOLAX) packet Take 17 g by mouth daily.  . sertraline (ZOLOFT) 25 MG tablet Take 25 mg by mouth daily.   . sodium bicarbonate 650 MG tablet Take 1 tablet (650 mg total) by mouth 2 (two) times daily.   No  facility-administered encounter medications on file as of 10/11/2019.    PHYSICAL EXAM:   General: debilitated, chronically ill, confused female Cardiovascular: regular rate and rhythm Pulmonary: clear ant fields Neurological: functionally quadriplegic  [place], NP

## 2019-10-14 ENCOUNTER — Other Ambulatory Visit: Payer: Self-pay

## 2019-10-16 ENCOUNTER — Other Ambulatory Visit: Payer: Self-pay

## 2019-10-16 ENCOUNTER — Encounter: Payer: Self-pay | Admitting: Nurse Practitioner

## 2019-10-16 ENCOUNTER — Non-Acute Institutional Stay: Payer: Medicare Other | Admitting: Nurse Practitioner

## 2019-10-16 DIAGNOSIS — F0390 Unspecified dementia without behavioral disturbance: Secondary | ICD-10-CM

## 2019-10-16 DIAGNOSIS — Z515 Encounter for palliative care: Secondary | ICD-10-CM

## 2019-10-16 NOTE — Progress Notes (Signed)
[facility] Consult Note Telephone: [phone]  Fax: [phone]  PATIENT NAME: Daisy Parker DOB: [birth year] MRN: [id number]  PRIMARY CARE PROVIDER:Dr Yves Dill PROVIDER:Dr Hodges/[place] daughter [phone]  RECOMMENDATIONS and PLAN: 1.IRS:WNIO coderevisited will revisit medical goals of care with daughter, Daisy Parker  2.Palliative care encounter; Palliative medicine team will continue to support patient, patient's family, and medical team. Visit consisted of counseling and education dealing with the complex and emotionally intense issues of symptom management and palliative care in the setting of serious and potentially life-threatening illness  I spent 65 minutes providing this consultation, starting at 9:30am. More than 50% of the time in this consultation was spent coordinating communication.   HISTORY OF PRESENT ILLNESS:  Daisy Parker is a 79 y.o. 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 Daisy Parker continues to reside in [place] at [facility]. Daisy [name] is a total lift for transfers, mobility, turning a positioning. Daisy Ozga does require total assistance for adl's, toileting as she is incontinent at times. Daisy. Scheib does feed herself after tray setup, prompting and encouragement. Current weight 171.0 lbs with 6lb weight loss over last 3 months with BMI 26.8 on regular diet, regular texture, regular liquid consistency with Med Plus supplement. No recent weight, as staff to re-weigh her today. Staff endorses no new changes are concerns. No recent wounds, falls, infections,  hospitalizations. She is followed by Psychiatry. At present Daisy Parker is lying in bed, appears debilitated sleeping. Daisy Parker awoke to verbal cues. Daisy Parker did make eye contact, smile, mumbling some words that were difficult understand. Daisy Parker was cooperative with the assessment. Daisy Parker was able to answer when asked if she was hurting or short of breath replying no. Asked if Daisy Parker was hungry and she replied "no". Asked if she would be willing to get out of bed and Daisy Parker endorses she is comfortable. Emotional support provided. Daisy Rowan, Daisy. Doffing daughter called for update on palliative care visit. We talked about slow progression of decline. We talked about chronic disease progression. We talked about the last few visits Daisy Parker had with Daisy Hoelzel. We talked about realistic expectations. We talked about medical goals at care. We talked about mobility and trying to get Daisy. Mormile more social, attend activities. We talked about seeing if more stimulation, social interaction would help with cognition, less sleepiness during the day. We did talk about that it could be that this is her baseline with ongoing decline. We talked about realistic expectations. Discuss why staff if they can get her up and try to bring her to activities to see the results. Daisy Parker endorses that her plans are to visit now that the facility has opened up to visitors. Encourage Daisy Parker to a staff to get her in the wheelchair when she is there and possibly taking her outside if weather permits. We talked about follow up palliative care visit and role of palliative care in plan of care. We talked about appetite as she does eat well for Daisy Parker when she visits. We talked about staff will be getting a new weight today. We talked about with stability and possible option of returning to an assisted living. Discuss functionally at present time that most likely she  will continue to require skilled nursing as her skill level has  increased past what an assisted-living could provide. Daisy Parker in agreement. Therapeutic listening and emotional support provided. Asked staff to get Daisy Parker into a w/c bring two activities, attempt to positively motivate in addition to weigh today. Will follow up in 2 weeks to see if improvements with socialization and mobility relating to her cognition.   Palliative Care was asked to help to continue to address goals of care.   CODE STATUS: full code  PPS: 30% HOSPICE ELIGIBILITY/DIAGNOSIS: TBD  PAST MEDICAL HISTORY:  Past Medical History:  Diagnosis Date   Acute lower UTI 05/17/2019   AKI (acute kidney injury) ([place]) 05/16/2018   Anemia    Bradycardia    Cataract    CKD (chronic kidney disease) 05/14/2018   CKD (chronic kidney disease), stage IV ([place])    COVID-19 virus infection 05/15/2019   Dementia ([place])    DNR (do not resuscitate) discussion    Duodenal perforation ([place])    Encephalopathy acute 05/14/2018   HTN (hypertension) 05/14/2018   Hypertension    Hypothyroidism    Mitral valve mass    Palliative care by specialist    Pneumonia due to COVID-19 virus 05/15/2019   RBBB    Second degree AV block, Mobitz type I    Second degree AV block, Mobitz type II    Syncope    Urinary frequency 03/13/2019    SOCIAL HX:  Social History   Tobacco Use   Smoking status: Former Smoker   Smokeless tobacco: Never Used  Substance Use Topics   Alcohol use: Never    ALLERGIES:  Allergies  Allergen Reactions   Codeine Anaphylaxis   Sulfa Antibiotics Anaphylaxis     PERTINENT MEDICATIONS:  Outpatient Encounter Medications as of 10/16/2019  Medication Sig   acetaminophen (TYLENOL) 500 MG tablet Take 1,000 mg by mouth 3 (three) times daily. 0800, 1400, 2000.   allopurinol (ZYLOPRIM) 100 MG tablet Take 100 mg by mouth daily.   amLODipine (NORVASC) 10 MG tablet Take 1 tablet (10 mg total) by mouth at bedtime.   brimonidine (ALPHAGAN) 0.2 % ophthalmic solution  Place 1 drop into both eyes 2 (two) times daily.   carvedilol (COREG) 6.25 MG tablet Take 1 tablet (6.25 mg total) by mouth 2 (two) times daily with a meal.   Cholecalciferol (VITAMIN D) 50 MCG (2000 UT) tablet Take 2,000 Units by mouth daily.   diclofenac Sodium (VOLTAREN) 1 % GEL Apply 2 g topically 3 (three) times daily. Applied to knees, hips, and lower back.   divalproex (DEPAKOTE SPRINKLE) 125 MG capsule Take 125 mg by mouth 2 (two) times daily.   fluticasone (FLONASE) 50 MCG/ACT nasal spray Place 1 spray into both nostrils daily.   latanoprost (XALATAN) 0.005 % ophthalmic solution Place 1 drop into both eyes at bedtime.   levothyroxine (SYNTHROID) 150 MCG tablet Take 150 mcg by mouth daily before breakfast.   Multiple Vitamins-Iron (DAILY VITE MULTIVITAMIN/IRON) TABS Take 1 tablet by mouth daily.   OLANZapine (ZYPREXA) 7.5 MG tablet Take 7.5 mg by mouth at bedtime.   pantoprazole (PROTONIX) 40 MG tablet Take 1 tablet (40 mg total) by mouth 2 (two) times daily.   polyethylene glycol (MIRALAX / GLYCOLAX) packet Take 17 g by mouth daily.   sertraline (ZOLOFT) 25 MG tablet Take 25 mg by mouth daily.    sodium bicarbonate 650 MG tablet Take 1 tablet (650 mg total) by mouth 2 (two) times daily.  No facility-administered encounter medications on file as of 10/16/2019.    PHYSICAL EXAM:   General: NAD, frail appearing, thin Cardiovascular: regular rate and rhythm Pulmonary: clear ant fields Neurological: functional quadriplegic  Rueben Kassim Ihor Gully, NP

## 2019-12-04 ENCOUNTER — Non-Acute Institutional Stay: Payer: Medicare Other | Admitting: Nurse Practitioner

## 2019-12-04 ENCOUNTER — Other Ambulatory Visit: Payer: Self-pay

## 2019-12-04 DIAGNOSIS — Z515 Encounter for palliative care: Secondary | ICD-10-CM

## 2019-12-04 DIAGNOSIS — F0390 Unspecified dementia without behavioral disturbance: Secondary | ICD-10-CM

## 2019-12-04 NOTE — Progress Notes (Signed)
Daisy Parker: [phone]  Fax: [phone]  PATIENT NAME: Daisy Parker DOB: 1940-07-17 MRN: [id number]   PRIMARY CARE PROVIDER:   Dr Yves Parker PROVIDER:  Dr Parker/[facility] RESPONSIBLE PARTY:   Nyonna [place]arg[place] daughter [phone]   RECOMMENDATIONS and PLAN:   1. ACP: Full code, goals of care reviewed with daughter Daisy Parker.    I met face to face with patient in at [facility] facility.  ASSESSMENT AND RECOMMENDATIONS:  1.  Advance Care Planning/Goals of Care: Goals include to maximize quality of life and symptom management. Advance care planning conversation with daughter Daisy Parker included a discussion about the value and importance of advance care planning, exploration of personal, cultural or spiritual beliefs that might influence medical decisions, exploration of goals of care in the event of a sudden injury or illness. We discussed disease trajectory of Dementia, which is a progressive disease, daughter Daisy Parker verbalized understanding of patient's chronic conditions. Daisy Parker said patient told her in the past that she want to be resuscitated in the event of cardiac or respiratory arrest and she is at this time struggling with changing her code status to DNR which according to her is right decision at this time given her current health/mental condition. She want to revisit ACP discussion at a later date.  2. Symptom Management: Patient was  Daughter expressed concerns for patient been in bed all the time, she wants patient to be up in wheelchair and be able to have opportunity to ambulate in wheelchair to other area of the facility when the daughter is visiting. Patient may bene fit from PT/OT for strengthing. Daisy Parker expressed concerns for patient sleeping all the time. Patient has not had any recent med change. Daughter aware that this may be one of the symptom of progression of  patient's dementia. Discussed possibility of daughter encouraging patient to get out of bed when she is visiting as she is a Engineer, production. Nursing report patient refusing offer to OOB.  3. Follow up Palliative Care Visit: Palliative care will continue to follow for goals of care review/clarification and symptom management. Return 4 weeks or prn.  4. Family /Caregiver/Community Supports: Daughter Daisy Parker is very involved in patient care.  5. Cognitive / Functional decline: Ongoing decline in function and cognition related to progression of dementia.  I spent 50 minutes providing this consultation,  from 12:30 to 01:20. More than 50% of the time in this consultation was spent coordinating communication.   CHIEF COMPLAINT: Routine follow up on Dementia  HISTORY OF PRESENT ILLNESS:  Daisy Parker is an 79 y.o. 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 Palliative Care was asked to follow this patient by consultation request of Daisy Nyra Capes to help address goals of care and advance care planning. This is a follow up visit.  CODE STATUS: Full code  PPS: 30%  HOSPICE ELIGIBILITY/DIAGNOSIS: TBD  PAST MEDICAL HISTORY:  Past Medical History:  Diagnosis Date  . Acute lower UTI 05/17/2019  . AKI (acute kidney injury) ([place]) 05/16/2018  . Anemia   . Bradycardia   . Cataract   . CKD (chronic kidney disease) 05/14/2018  . CKD (chronic kidney disease), stage IV ([place])   . COVID-19 virus infection 05/15/2019  . Dementia ([place])   .  DNR (do not resuscitate) discussion   . Duodenal perforation ([place])   . Encephalopathy acute 05/14/2018  . HTN (hypertension) 05/14/2018  . Hypertension   . Hypothyroidism   . Mitral valve mass   . Palliative care by specialist     . Pneumonia due to COVID-19 virus 05/15/2019  . RBBB   . Second degree AV block, Mobitz type I   . Second degree AV block, Mobitz type II   . Syncope   . Urinary frequency 03/13/2019    SOCIAL HX:  Social History   Tobacco Use  . Smoking status: Former Research scientist (life sciences)  . Smokeless tobacco: Never Used  Substance Use Topics  . Alcohol use: Never   FAMILY HX:  Family History  Family history unknown: Yes    ALLERGIES:  Allergies  Allergen Reactions  . Codeine Anaphylaxis  . Sulfa Antibiotics Anaphylaxis     PERTINENT MEDICATIONS:  Outpatient Encounter Medications as of 12/04/2019  Medication Sig  . acetaminophen (TYLENOL) 500 MG tablet Take 1,000 mg by mouth 3 (three) times daily. 0800, 1400, 2000.  Marland Kitchen allopurinol (ZYLOPRIM) 100 MG tablet Take 100 mg by mouth daily.  Marland Kitchen amLODipine (NORVASC) 10 MG tablet Take 1 tablet (10 mg total) by mouth at bedtime.  . brimonidine (ALPHAGAN) 0.2 % ophthalmic solution Place 1 drop into both eyes 2 (two) times daily.  . carvedilol (COREG) 6.25 MG tablet Take 1 tablet (6.25 mg total) by mouth 2 (two) times daily with a meal.  . Cholecalciferol (VITAMIN D) 50 MCG (2000 UT) tablet Take 2,000 Units by mouth daily.  . diclofenac Sodium (VOLTAREN) 1 % GEL Apply 2 g topically 3 (three) times daily. Applied to knees, hips, and lower back.  . divalproex (DEPAKOTE SPRINKLE) 125 MG capsule Take 125 mg by mouth 2 (two) times daily.  . fluticasone (FLONASE) 50 MCG/ACT nasal spray Place 1 spray into both nostrils daily.  Marland Kitchen latanoprost (XALATAN) 0.005 % ophthalmic solution Place 1 drop into both eyes at bedtime.  Marland Kitchen levothyroxine (SYNTHROID) 150 MCG tablet Take 150 mcg by mouth daily before breakfast.  . Multiple Vitamins-Iron (DAILY VITE MULTIVITAMIN/IRON) TABS Take 1 tablet by mouth daily.  Marland Kitchen OLANZapine (ZYPREXA) 7.5 MG tablet Take 7.5 mg by mouth at bedtime.  . pantoprazole (PROTONIX) 40 MG tablet Take 1 tablet (40 mg total) by mouth 2 (two) times daily.  .  polyethylene glycol (MIRALAX / GLYCOLAX) packet Take 17 g by mouth daily.  . sertraline (ZOLOFT) 25 MG tablet Take 25 mg by mouth daily.   . sodium bicarbonate 650 MG tablet Take 1 tablet (650 mg total) by mouth 2 (two) times daily.   No facility-administered encounter medications on file as of 12/04/2019.    PHYSICAL EXAM / ROS:   Current and past weights: 171bls last month, BMI 26.8 General: frail appearing. Lying in bed, cooperative in NAD. No report of fever or chills. Facility chart reviewed, vital signs stable. Cardiovascular: Denied chest pain, no edema noted. S1S2 noted,  Pulmonary: no cough, no increased SOB, on room air Abdomen: appetite fair appetite reported, incontinent of bowel. No abdominal tenderness, bowel sound present GU:  incontinent of urine,denies dysuria  MSK:   non abulatory Skin: no rashes noted on exposed skin Neurological: Weakness, but otherwise nonfocal. Alert and oriented to self  Jari Favre, DNP, AGPCNP

## 2019-12-10 ENCOUNTER — Ambulatory Visit (INDEPENDENT_AMBULATORY_CARE_PROVIDER_SITE_OTHER): Payer: Medicare Other | Admitting: *Deleted

## 2019-12-10 DIAGNOSIS — I441 Atrioventricular block, second degree: Secondary | ICD-10-CM | POA: Diagnosis not present

## 2019-12-10 LAB — CUP PACEART REMOTE DEVICE CHECK
Battery Remaining Longevity: 100 mo
Battery Remaining Percentage: 95.5 %
Battery Voltage: 3.01 V
Brady Statistic AP VP Percent: 58 %
Brady Statistic AP VS Percent: 0 %
Brady Statistic AS VP Percent: 42 %
Brady Statistic AS VS Percent: 0 %
Brady Statistic RA Percent Paced: 1 %
Brady Statistic RV Percent Paced: 99 %
Date Time Interrogation Session: 20210831020014
Implantable Lead Implant Date: 20201130
Implantable Lead Implant Date: 20201130
Implantable Lead Location: 753859
Implantable Lead Location: 753860
Implantable Pulse Generator Implant Date: 20201130
Lead Channel Impedance Value: 340 Ohm
Lead Channel Impedance Value: 590 Ohm
Lead Channel Pacing Threshold Amplitude: 0.5 V
Lead Channel Pacing Threshold Amplitude: 0.5 V
Lead Channel Pacing Threshold Pulse Width: 0.5 ms
Lead Channel Pacing Threshold Pulse Width: 0.5 ms
Lead Channel Sensing Intrinsic Amplitude: 2.2 mV
Lead Channel Sensing Intrinsic Amplitude: 9 mV
Lead Channel Setting Pacing Amplitude: 0.75 V
Lead Channel Setting Pacing Amplitude: 3.5 V
Lead Channel Setting Pacing Pulse Width: 0.5 ms
Lead Channel Setting Sensing Sensitivity: 2 mV
Pulse Gen Model: 2272
Pulse Gen Serial Number: 9182735

## 2019-12-11 NOTE — Progress Notes (Signed)
Remote pacemaker transmission.   

## 2019-12-29 IMAGING — US US RENAL
1 series · 14 of 25 positions shown · non-contrast
Comparison: 12/29/2015

CLINICAL DATA: Acute kidney injury.

EXAM:
RENAL / URINARY TRACT ULTRASOUND COMPLETE

[Series 1: us renal · 50 acquisitions, 14 frames shown]
[im 1/50]
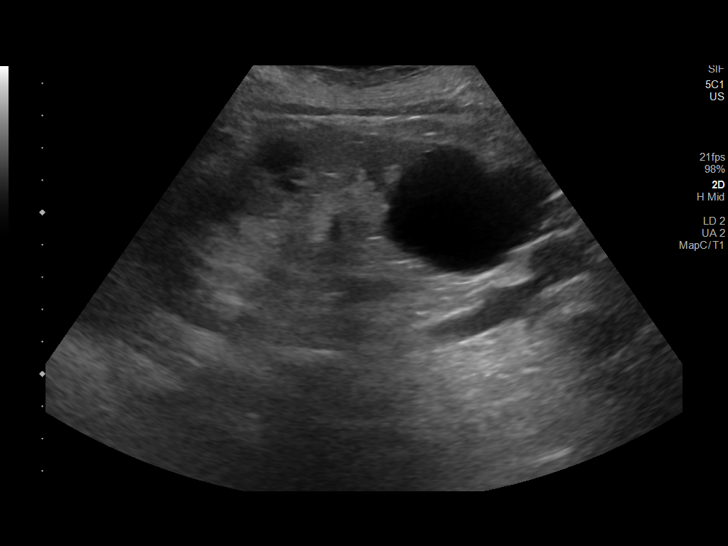
[im 5/50]
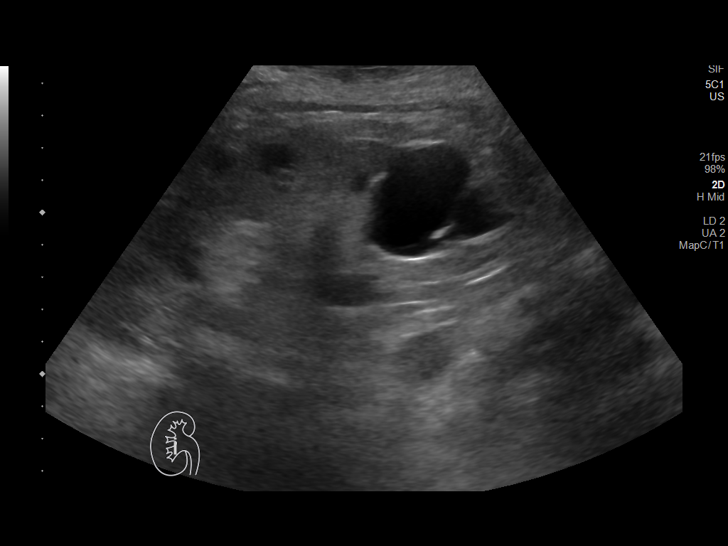
[im 9/50]
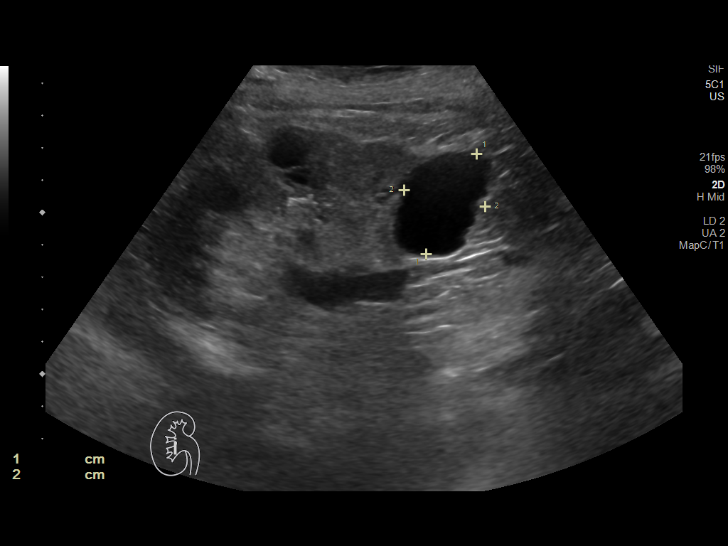
[im 13/50]
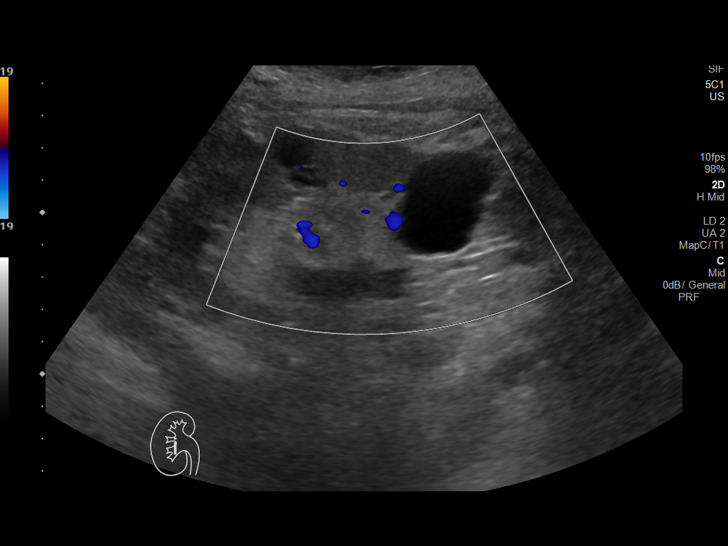
[im 17/50]
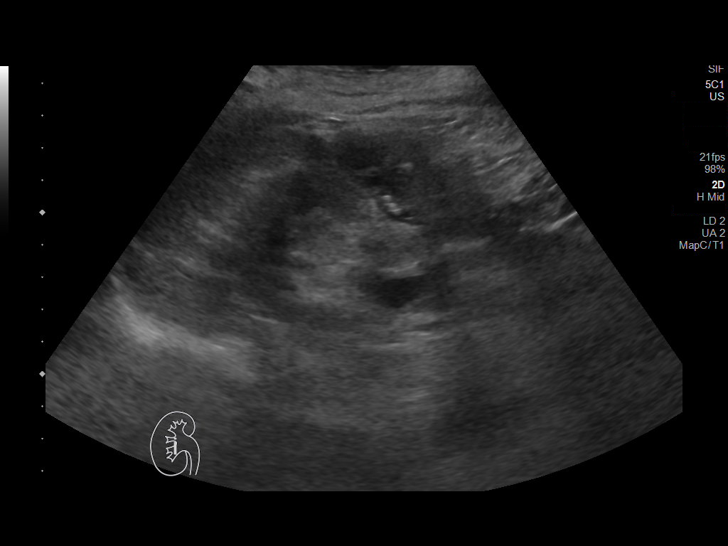
[im 19/50]
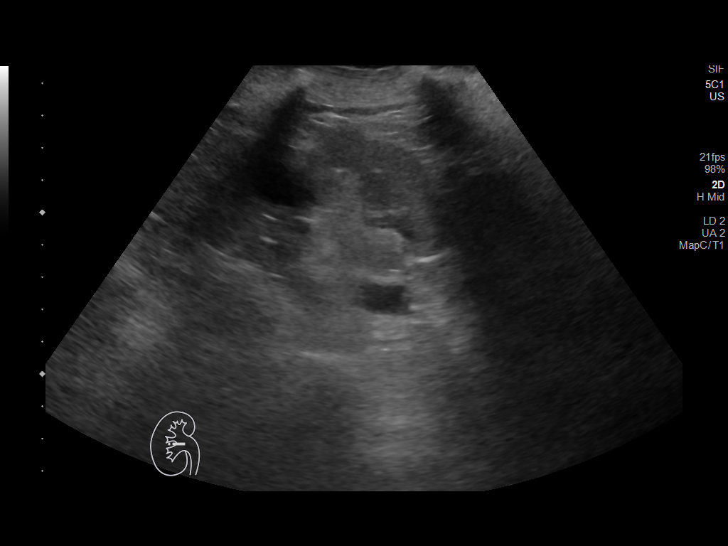
[im 23/50]
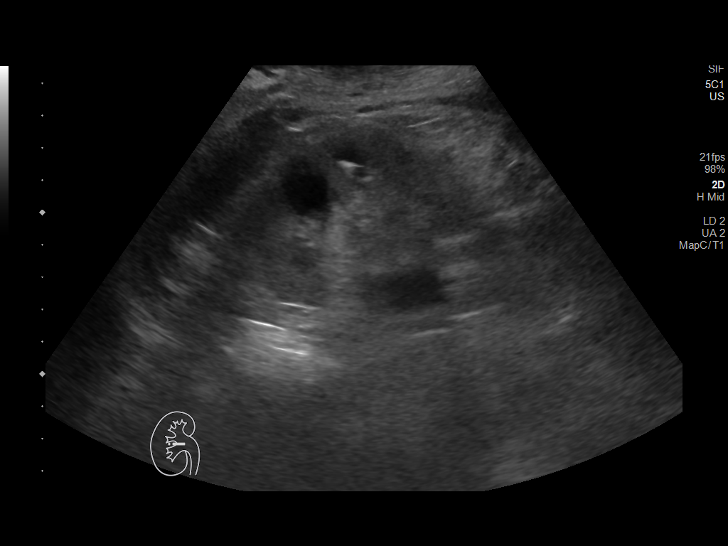
[im 27/50]
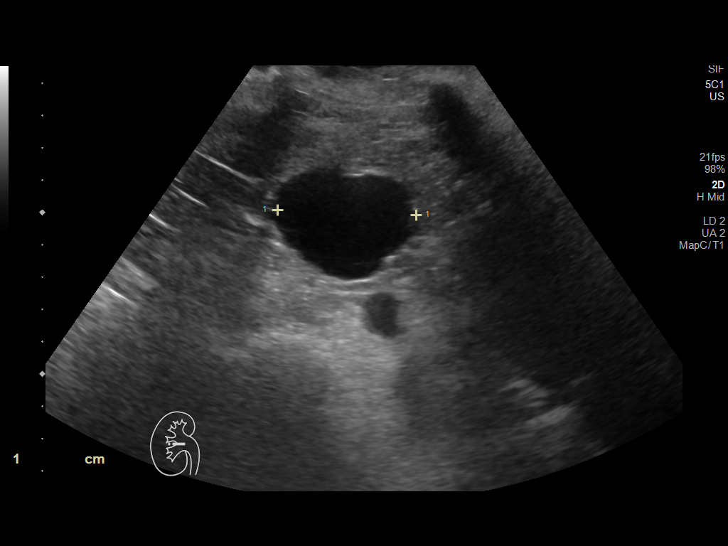
[im 31/50]
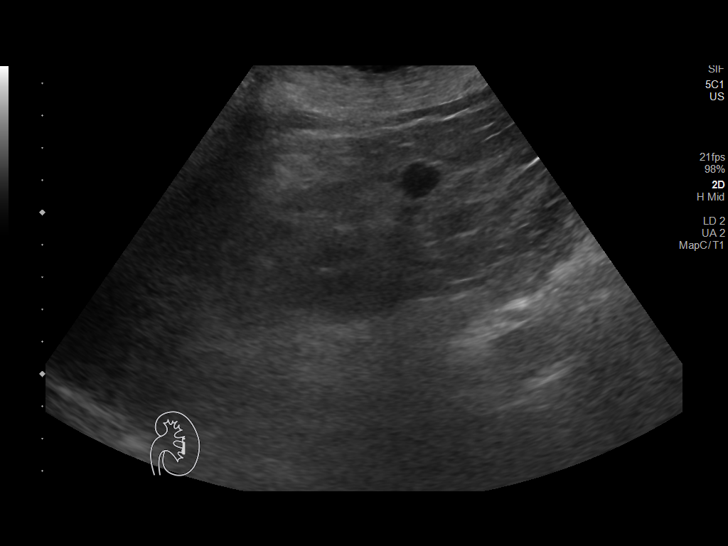
[im 33/50]
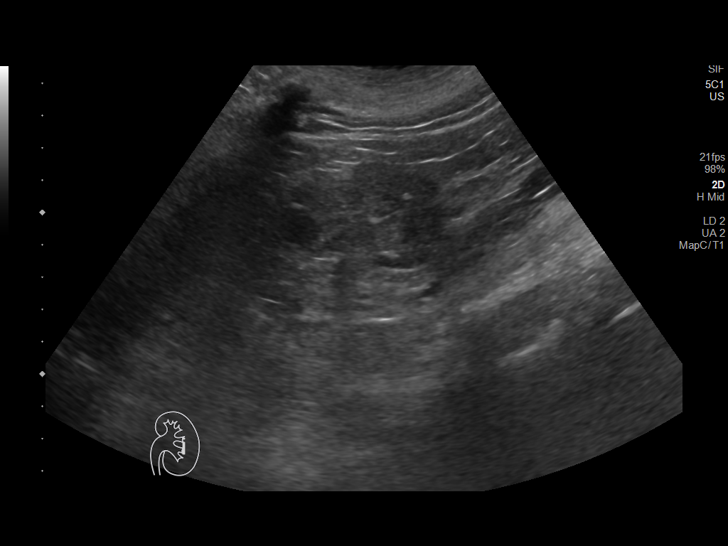
[im 37/50]
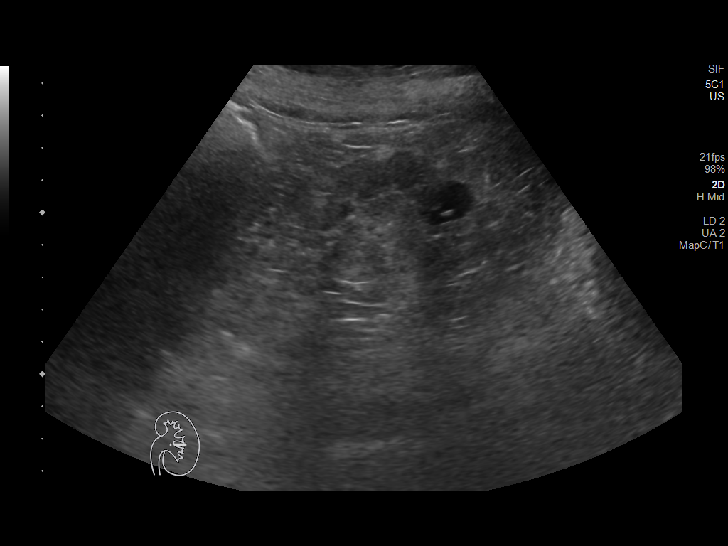
[im 41/50]
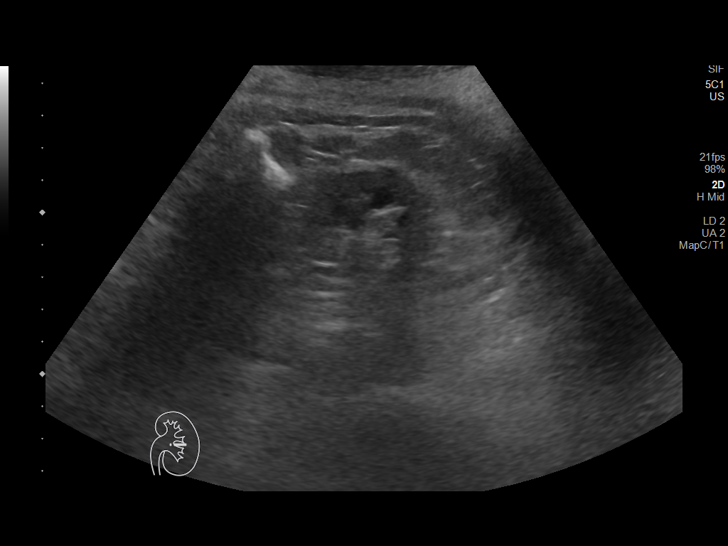
[im 45/50]
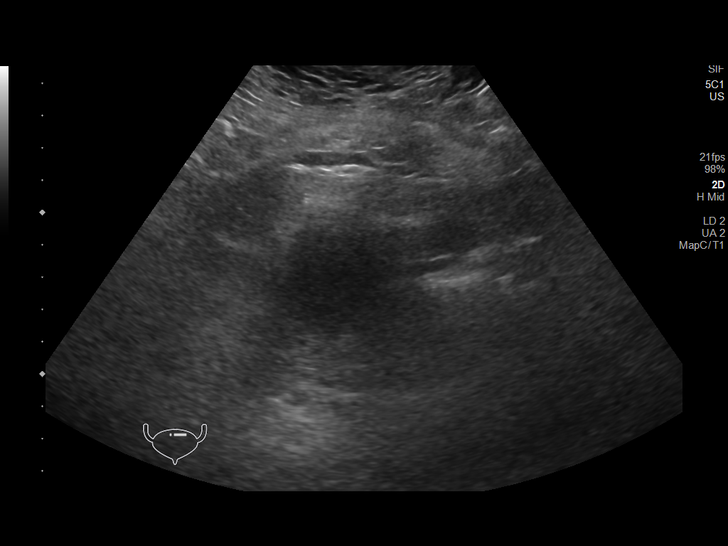
[im 50/50]
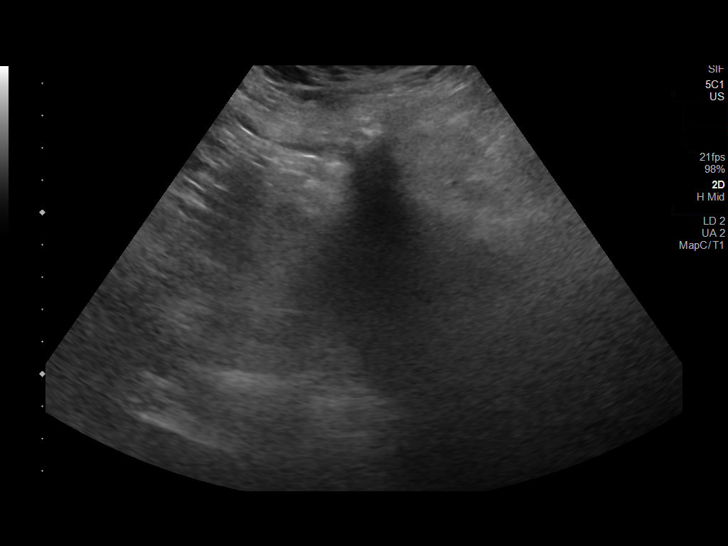

[14 of 25 positions shown; findings below may reference images not displayed]

FINDINGS: Right Kidney:

Renal measurements: 12.4 x 5.1 x 4.3 cm = volume: 141 mL. Increased
cortical echogenicity. No hydronephrosis. Small echogenic focus in
the mid kidney may represent a small nonobstructing calculus or
vascular calcification. Simple cysts include a 2 cm upper pole and
3.5 cm lower pole cyst. Both have a benign appearance.

Left Kidney:

Renal measurements: 9.3 x 5.1 x 4.0 cm = volume: 100 mL. Increased
cortical echogenicity. No hydronephrosis.

Bladder:

Decompressed.

Other:

None.
IMPRESSION: Both kidneys demonstrate increased cortical echogenicity consistent
with chronic kidney disease. No hydronephrosis.

## 2020-01-09 ENCOUNTER — Encounter: Payer: Self-pay | Admitting: Nurse Practitioner

## 2020-01-09 ENCOUNTER — Non-Acute Institutional Stay: Payer: Medicare Other | Admitting: Nurse Practitioner

## 2020-01-09 VITALS — BP 132/76 | Temp 97.4°F | Wt 171.0 lb

## 2020-01-09 DIAGNOSIS — F0390 Unspecified dementia without behavioral disturbance: Secondary | ICD-10-CM

## 2020-01-09 DIAGNOSIS — Z515 Encounter for palliative care: Secondary | ICD-10-CM

## 2020-01-09 NOTE — Progress Notes (Signed)
Daisy Parker: [phone]  Fax: [phone]  PATIENT NAME: Daisy Parker DOB: 12/09/1940 MRN: [id number]  PRIMARY CARE PROVIDER:Dr Yves Dill PROVIDER:Dr Hodges/[place] daughter [phone]  RECOMMENDATIONS and PLAN: 1.DTO:IZTI coderevisitedwill revisit medical goals of care with daughter, Daisy Parker  2.Palliative care encounter; Palliative medicine team will continue to support patient, patient's family, and medical team. Visit consisted of counseling and education dealing with the complex and emotionally intense issues of symptom management and palliative care in the setting of serious and potentially life-threatening illness  3. F/u 1 month for ongoing monitoring weight, functional and cognitive decline  I spent 55 minutes providing this consultation,  Start at 11:15am More than 50% of the time in this consultation was spent coordinating communication.   HISTORY OF PRESENT ILLNESS:  Daisy Parker is a 79 y.o. 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 [name] continues to reside at [place] at [facility]. [name] does require total assistance for transfers, mobility, adl's including bathing, dressing, toileting. [name] does require to be assisted with meals including seating at times. Appetite remains decline. No recent wounds, infections, hospitalizations. 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 No changes at this visit. Staff endorses no new changes or concerned still slow progression decline cognitively. [name] does remain a full code. I visited and observed Ms. Daisy Parker. [name] was asleep, awoke to verbal cues. Ms. Arshad did make eye contact and mumble, attempt to answer questions. Ms. Warzecha was cooperative with assessment. No meaningful discussion due to cognitive impairment. Ms. Thang does look comfortable at present time though appears to be the overall slowly declining in the setting of dementia. Emotional support provided. Medical goals reviews. Will contact [name] for update on palliative care visit, further discussion of goals of hair and revisit code status. I have up nursing staff many changes that present time until further discussions with [name], Ms. Soza daughter.  Palliative Care was asked to help to continue to address goals of care.   CODE STATUS: full code  PPS: 30% HOSPICE ELIGIBILITY/DIAGNOSIS: TBD  PAST MEDICAL HISTORY:  Past Medical History:  Diagnosis Date  . Acute lower UTI 05/17/2019  . AKI (acute kidney injury) ([place]) 05/16/2018  . Anemia   . Bradycardia   . Cataract   . CKD (chronic kidney disease) 05/14/2018  . CKD (chronic kidney disease), stage IV ([place])   . COVID-19 virus infection 05/15/2019  . Dementia ([place])   . DNR (do not resuscitate) discussion   . Duodenal perforation ([place])   . Encephalopathy acute 05/14/2018  . HTN (hypertension) 05/14/2018  . Hypertension   . Hypothyroidism   . Mitral valve mass   . Palliative care by specialist   . Pneumonia due to COVID-19 virus 05/15/2019  . RBBB   . Second degree AV block, Mobitz type I   . Second degree AV block, Mobitz type II   . Syncope   . Urinary frequency 03/13/2019    SOCIAL HX:  Social History   Tobacco Use  .  Smoking status: Former Research scientist (life sciences)  . Smokeless tobacco: Never Used  Substance Use Topics  . Alcohol use: Never    ALLERGIES:  Allergies    Allergen Reactions  . Codeine Anaphylaxis  . Sulfa Antibiotics Anaphylaxis     PERTINENT MEDICATIONS:  Outpatient Encounter Medications as of 01/09/2020  Medication Sig  . acetaminophen (TYLENOL) 500 MG tablet Take 1,000 mg by mouth 3 (three) times daily. 0800, 1400, 2000.  Marland Kitchen allopurinol (ZYLOPRIM) 100 MG tablet Take 100 mg by mouth daily.  Marland Kitchen amLODipine (NORVASC) 10 MG tablet Take 1 tablet (10 mg total) by mouth at bedtime.  . brimonidine (ALPHAGAN) 0.2 % ophthalmic solution Place 1 drop into both eyes 2 (two) times daily.  . carvedilol (COREG) 6.25 MG tablet Take 1 tablet (6.25 mg total) by mouth 2 (two) times daily with a meal.  . Cholecalciferol (VITAMIN D) 50 MCG (2000 UT) tablet Take 2,000 Units by mouth daily.  . diclofenac Sodium (VOLTAREN) 1 % GEL Apply 2 g topically 3 (three) times daily. Applied to knees, hips, and lower back.  . divalproex (DEPAKOTE SPRINKLE) 125 MG capsule Take 125 mg by mouth 2 (two) times daily.  . fluticasone (FLONASE) 50 MCG/ACT nasal spray Place 1 spray into both nostrils daily.  Marland Kitchen latanoprost (XALATAN) 0.005 % ophthalmic solution Place 1 drop into both eyes at bedtime.  Marland Kitchen levothyroxine (SYNTHROID) 150 MCG tablet Take 150 mcg by mouth daily before breakfast.  . Multiple Vitamins-Iron (DAILY VITE MULTIVITAMIN/IRON) TABS Take 1 tablet by mouth daily.  Marland Kitchen OLANZapine (ZYPREXA) 7.5 MG tablet Take 7.5 mg by mouth at bedtime.  . pantoprazole (PROTONIX) 40 MG tablet Take 1 tablet (40 mg total) by mouth 2 (two) times daily.  . polyethylene glycol (MIRALAX / GLYCOLAX) packet Take 17 g by mouth daily.  . sertraline (ZOLOFT) 25 MG tablet Take 25 mg by mouth daily.   . sodium bicarbonate 650 MG tablet Take 1 tablet (650 mg total) by mouth 2 (two) times daily.   No facility-administered encounter medications on file as of 01/09/2020.    PHYSICAL EXAM:   General: debilitated, chronically ill, confused pleasant female Cardiovascular: regular rate and rhythm Pulmonary:  clear ant fields Neurological: functionally quadriplegic  [place] Noyce Ihor Gully, NP

## 2020-01-10 ENCOUNTER — Other Ambulatory Visit: Payer: Self-pay

## 2020-01-15 ENCOUNTER — Non-Acute Institutional Stay: Payer: Medicare Other | Admitting: Nurse Practitioner

## 2020-01-15 ENCOUNTER — Encounter: Payer: Self-pay | Admitting: Nurse Practitioner

## 2020-01-15 DIAGNOSIS — F0390 Unspecified dementia without behavioral disturbance: Secondary | ICD-10-CM

## 2020-01-15 DIAGNOSIS — Z515 Encounter for palliative care: Secondary | ICD-10-CM

## 2020-01-15 NOTE — Progress Notes (Signed)
[facility] Consult Note Telephone: [phone]  Fax: [phone]  PATIENT NAME: Daisy Parker DOB: 1940/04/20 MRN: [id number]  PRIMARY CARE PROVIDER:Dr Yves Dill PROVIDER:Dr Hodges/ [place] daughter [phone]  RECOMMENDATIONS and PLAN: 1.JJO:ACZY coderevisitedwill revisit medical goals of care with daughter, Daisy Parker  2.Palliative care encounter; Palliative medicine team will continue to support patient, patient's family, and medical team. Visit consisted of counseling and education dealing with the complex and emotionally intense issues of symptom management and palliative care in the setting of serious and potentially life-threatening illness  3. F/u 1 month for ongoing monitoring weight, functional and cognitive decline  I spent 60 minutes providing this consultation, start at 12:05pm. More than 50% of the time in this consultation was spent coordinating communication.   HISTORY OF PRESENT ILLNESS:  [name] is a 79 y.o. 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 Daisy Parker continues to reside at [place] at [facility]. Daisy Parker remains bedbound total dependent and requires a hoyer lift for transferring, mobility. Daisy.  Parker is ADL care including bathing, dressing as she is also incontinent bowel and bladder. Daisy Parker does require assistance with tray setup in feeding. Daisy Parker does require a lot of prompting and encouraging to eat. Palliative care visit today to zoom with her daughter. Daisy Parker endorses she has not been able to see Daisy Krage in person for some time covid  restriction. At present Daisy Parker is lying in bed asleep. Daisy Parker awoke to verbal cues. Daisy Parker does appear comfortable though debilitated, chronically ill. No visitors present. I visited and observe Daisy Parker. We talked about purpose of palliative care visit. We talked about how she was feeling today. Daisy Parker repeated multiple times that she was woken up from her nap. Limited verbal discussion with cognitive impairment. Emotional support provided. Daisy Parker was cooperative with assessment. I opened palliative care meeting with Daisy Parker, Daisy Parker daughter, Daisy Parker for visit. Daisy Rowan, Daisy. Parker daughter talked for a while very basic discussions due to cognitive impairment. Daisy Parker was interactive with the computer speaking to [place]. No meaningful discussion with cognitive impairment. Daisy Parker was very supportive. We talked about no visitation at present times. Daisy Parker express she was very grateful to be able to see Daisy Parker as it is hard to see her through the window. Emotional support provided. Palliative care update given. Medical goals are viewed. Discuss with Daisy Parker if she is not able to see Daisy Parker within the next week will revisit another Zoom visit. Daisy Parker in agreement. I updated nursing staff and any changes to current goals or plan of care.  Palliative Care was asked to help to continue to address goals of care.   CODE STATUS: full code   PPS: 30% HOSPICE ELIGIBILITY/DIAGNOSIS: TBD  PAST MEDICAL HISTORY:  Past Medical History:  Diagnosis Date  . Acute lower UTI 05/17/2019  . AKI (acute kidney injury) ([place]) 05/16/2018  . Anemia   . Bradycardia   . Cataract   . CKD (chronic kidney disease) 05/14/2018  . CKD (chronic kidney disease), stage IV ([place])   . COVID-19 virus infection 05/15/2019  . Dementia ([place])   . DNR (do not resuscitate) discussion   . Duodenal perforation ([place])   .  Encephalopathy acute 05/14/2018  . HTN (hypertension) 05/14/2018  . Hypertension   .  Hypothyroidism   . Mitral valve mass   . Palliative care by specialist   . Pneumonia due to COVID-19 virus 05/15/2019  . RBBB   . Second degree AV block, Mobitz type I   . Second degree AV block, Mobitz type II   . Syncope   . Urinary frequency 03/13/2019    SOCIAL HX:  Social History   Tobacco Use  . Smoking status: Former Research scientist (life sciences)  . Smokeless tobacco: Never Used  Substance Use Topics  . Alcohol use: Never    ALLERGIES:  Allergies  Allergen Reactions  . Codeine Anaphylaxis  . Sulfa Antibiotics Anaphylaxis     PERTINENT MEDICATIONS:  Outpatient Encounter Medications as of 01/15/2020  Medication Sig  . acetaminophen (TYLENOL) 500 MG tablet Take 1,000 mg by mouth 3 (three) times daily. 0800, 1400, 2000.  Marland Kitchen allopurinol (ZYLOPRIM) 100 MG tablet Take 100 mg by mouth daily.  Marland Kitchen amLODipine (NORVASC) 10 MG tablet Take 1 tablet (10 mg total) by mouth at bedtime.  . brimonidine (ALPHAGAN) 0.2 % ophthalmic solution Place 1 drop into both eyes 2 (two) times daily.  . carvedilol (COREG) 6.25 MG tablet Take 1 tablet (6.25 mg total) by mouth 2 (two) times daily with a meal.  . Cholecalciferol (VITAMIN D) 50 MCG (2000 UT) tablet Take 2,000 Units by mouth daily.  . diclofenac Sodium (VOLTAREN) 1 % GEL Apply 2 g topically 3 (three) times daily. Applied to knees, hips, and lower back.  . divalproex (DEPAKOTE SPRINKLE) 125 MG capsule Take 125 mg by mouth 2 (two) times daily.  . fluticasone (FLONASE) 50 MCG/ACT nasal spray Place 1 spray into both nostrils daily.  Marland Kitchen latanoprost (XALATAN) 0.005 % ophthalmic solution Place 1 drop into both eyes at bedtime.  Marland Kitchen levothyroxine (SYNTHROID) 150 MCG tablet Take 150 mcg by mouth daily before breakfast.  . Multiple Vitamins-Iron (DAILY VITE MULTIVITAMIN/IRON) TABS Take 1 tablet by mouth daily.  Marland Kitchen OLANZapine (ZYPREXA) 7.5 MG tablet Take 7.5 mg by mouth at bedtime.  . pantoprazole (PROTONIX) 40 MG tablet Take 1 tablet (40 mg total) by mouth 2 (two) times daily.  .  polyethylene glycol (MIRALAX / GLYCOLAX) packet Take 17 g by mouth daily.  . sertraline (ZOLOFT) 25 MG tablet Take 25 mg by mouth daily.   . sodium bicarbonate 650 MG tablet Take 1 tablet (650 mg total) by mouth 2 (two) times daily.   No facility-administered encounter medications on file as of 01/15/2020.    PHYSICAL EXAM:   General: debilitated, confused, pleasant female Cardiovascular: regular rate and rhythm Pulmonary: clear ant fields Neurological: generalized weakness; functionally quadriplegic  [place], NP

## 2020-01-16 ENCOUNTER — Other Ambulatory Visit: Payer: Self-pay

## 2020-02-12 ENCOUNTER — Encounter: Payer: Self-pay | Admitting: Nurse Practitioner

## 2020-02-12 ENCOUNTER — Non-Acute Institutional Stay: Payer: Medicare Other | Admitting: Nurse Practitioner

## 2020-02-12 ENCOUNTER — Other Ambulatory Visit: Payer: Self-pay

## 2020-02-12 VITALS — BP 100/58 | Temp 97.3°F | Wt 169.4 lb

## 2020-02-12 DIAGNOSIS — F0281 Dementia in other diseases classified elsewhere with behavioral disturbance: Secondary | ICD-10-CM

## 2020-02-12 DIAGNOSIS — F02818 Dementia in other diseases classified elsewhere, unspecified severity, with other behavioral disturbance: Secondary | ICD-10-CM

## 2020-02-12 NOTE — Progress Notes (Signed)
[facility] Consult Note Telephone: [phone]  Fax: [phone]  PATIENT NAME: Daisy Parker DOB: Feb 17, 1941 MRN: [id number] PRIMARY CARE PROVIDER:Dr Yves Dill PROVIDER:Dr Hodges/[place] daughter [phone]  RECOMMENDATIONS and PLAN: 1.YYT:KPTW coderevisitedwill revisit medical goals of care with daughter, [name]  2.Palliative care encounter; Palliative medicine team will continue to support patient, patient's family, and medical team. Visit consisted of counseling and education dealing with the complex and emotionally intense issues of symptom management and palliative care in the setting of serious and potentially life-threatening illness  3. F/u 1 month for ongoing monitoring weight, functional and cognitive decline  I spent  90 minutes providing this consultation, start at 10:00am. More than 50% of the time in this consultation was spent coordinating communication.   HISTORY OF PRESENT ILLNESS:  Daisy Parker is a 79 y.o. 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 Daisy Parker continues to reside at [place] at [facility]. Daisy Parker remains in bed and would require a hoyer lift to transfer, mobility. Daisy Parker requires assistance for adl's bathing, dressing. Ms Parker is incontinent. Ms Parker will feed herself after tray setup, prompting to eat. Daisy Parker on a regular diet regular texture regular liquid consistency with medplus. Daisy Parker current weight is 169.4 lb, BMI 26.5 and she has lost 3 lb in the last month. No recent falls, wounds, infections. Care plan meeting was  held on 11/2 / 2021 no changes in plan of care. 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 No changes at this visit. Staff endorses no new changes their concerns. At present Ms Parker is lying in bed, appears debilitated, comfortable. No visitors present. I visited and observed Ms Parker. Daisy Parker I made eye contact with verbal cues. Ms Parker was limited with answering questions due to cognitive impairment. Ms Heminger appeared very interactive, Cooperative with assessment. Attempted to ask Ms Parker what she had to eat for breakfast but she was not able to recall. Ms Parker endorses her daughter Daisy Parker did come to see her a couple days ago and she enjoyed the visit. Most of Palliative visit was supportive role. Medical goals reviewed. I called Ms Bettey Parker daughter [name] / Daisy Parker message left for update on Palliative care, will readresscode status upon further discussion. I updated nursing staff no new changes to current goals or plan of care. I called Daisy Parker/Daisy Parker back, clinical update given. We talked about visit with Daisy Parker. We talked about disease progression of dementia. We talked about mobility getting into w/c. We talked about facility dentist. We talked about symptoms, appetite. We talked about medical goals of therapy including code status. We talked at length about scenarios, suffering, full code vs dnr. Daisy Parker/Daisy Parker talked about family history. Plan to revisit in 2 days giving Ms. Daisy Parker/Daisy Parker to further think about code status. Therapeutic listening, emotional support provided. Questions answered.  Palliative Care was asked to help to continue to address goals of care.   CODE STATUS: Full code  PPS: 30% HOSPICE ELIGIBILITY/DIAGNOSIS: TBD  PAST MEDICAL HISTORY:  Past Medical History:  Diagnosis Date  .  Acute lower  UTI 05/17/2019  . AKI (acute kidney injury) ([place]) 05/16/2018  . Anemia   . Bradycardia   . Cataract   . CKD (chronic kidney disease) 05/14/2018  . CKD (chronic kidney disease), stage IV ([place])   . COVID-19 virus infection 05/15/2019  . Dementia ([place])   . DNR (do not resuscitate) discussion   . Duodenal perforation ([place])   . Encephalopathy acute 05/14/2018  . HTN (hypertension) 05/14/2018  . Hypertension   . Hypothyroidism   . Mitral valve mass   . Palliative care by specialist   . Pneumonia due to COVID-19 virus 05/15/2019  . RBBB   . Second degree AV block, Mobitz type I   . Second degree AV block, Mobitz type II   . Syncope   . Urinary frequency 03/13/2019    SOCIAL HX:  Social History   Tobacco Use  . Smoking status: Former Research scientist (life sciences)  . Smokeless tobacco: Never Used  Substance Use Topics  . Alcohol use: Never    ALLERGIES:  Allergies  Allergen Reactions  . Codeine Anaphylaxis  . Sulfa Antibiotics Anaphylaxis     PHYSICAL EXAM:   General: NAD, frail appearing, debilitated, oriented to self female Cardiovascular: regular rate and rhythm Pulmonary: clear ant fields Neurological: functionally quadriplegic  [name] Ihor Gully, NP

## 2020-02-19 ENCOUNTER — Encounter: Payer: Self-pay | Admitting: Nurse Practitioner

## 2020-02-19 ENCOUNTER — Non-Acute Institutional Stay: Payer: Medicare Other | Admitting: Nurse Practitioner

## 2020-02-19 ENCOUNTER — Other Ambulatory Visit: Payer: Self-pay

## 2020-02-19 DIAGNOSIS — G308 Other Alzheimer's disease: Secondary | ICD-10-CM

## 2020-02-19 DIAGNOSIS — F02818 Dementia in other diseases classified elsewhere, unspecified severity, with other behavioral disturbance: Secondary | ICD-10-CM

## 2020-02-19 DIAGNOSIS — Z515 Encounter for palliative care: Secondary | ICD-10-CM

## 2020-02-19 NOTE — Progress Notes (Signed)
Daisy Parker Consult Note Telephone: [phone]  Fax: [phone]  PATIENT NAME: Daisy Parker DOB: 02-08-1941 MRN: [id number] PRIMARY CARE PROVIDER:Dr Yves Dill PROVIDER:Dr Hodges/[place] daughter [phone]  RECOMMENDATIONS and PLAN: 1.TDV:VOHY DNR; placed in vynca; will complete MOST at next visit 1 week, give daughter time to continue to think about decisions  2.Palliative care encounter; Palliative medicine team will continue to support patient, patient's family, and medical team. Visit consisted of counseling and education dealing with the complex and emotionally intense issues of symptom management and palliative care in the setting of serious and potentially life-threatening illness  3. F/u 1 month for ongoing monitoring weight, functional and cognitive decline  I spent 66 minutes providing this consultation, starting at 1:30pm. More than 50% of the time in this consultation was spent coordinating communication.   HISTORY OF PRESENT ILLNESS:  Daisy Parker is a 79 y.o. 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 Follow up visit for Palliative care requested by [facility] Ms. Daisy Parker daughter for further discussion of goals of care. Ms. Daisy Parker continues to reside at [place] at [facility]. Ms. Daisy Parker does remain bed-bound, require staff turning in positioning, bathing and dressing, toileting. Ms. Daisy Parker remains incontinent of bowel and bladder. Ms. Daisy Parker does require assistance from staff for feeding as it takes some time for her to eat. Staff endorses today she ate a good lunch.  Current weight is stable 168.2 lb with BMI 26.3 and currently on a regular diet regular text or regular liquid consistency with [facility]. No recent falls, wounds, infections, hospitalizations. Staff endorses no new changes are concerned. At present Ms. Daisy Parker is lying in bed, appears debilitated, comfortable, no visitors present. I visited and observed Ms. Daisy Parker. [name] did make eye contact with verbal cues. [name] was cooperative with assessment. Limited discussion with cognitive impairment. [name] had a difficult time answering questions so she did ask questions such as how are you today. Emotional support provided. I called Daisy Rowan, Ms. Daisy Parker daughter for further discussion on medical goals of care. We talked about Palliative care visit with Ms. Daisy Parker today. We talked about symptoms, wait, appetite. We talked about Daisy Parker most recent visit with Ms. Daisy Parker. We talked about chronic disease progression of dementia. We talked about multiple different forms of dementia. We talked about different behaviors with dementia. We talked about initially Ms. Daisy Parker behavior she had periods of anger, irritability but that seems to have subsided. We talked about realistic expectations. We talked about cognitively what she is truly capable of which is very limited. We talked about judgment, memory, ability to process and understand. Daisy Parker talked about decorating her room for Christmas. We talked about the benefit of mobility and getting out of bed. Daisy Parker endorses that care plan meeting was going to have to be rescheduled. We talked about medical goals of care including aggressive versus conservative versus comfort care. We reviewed most form. Daisy Parker endorses that her wishes are for Ms. Daisy Parker to be a do not resuscitate. [place] form completed and placed in vynca. Daisy Parker endorses she wants to further think about most form. Will revisit with next visit in one week with face-to-face family discussion  with completion of most form. Appointment scheduled. We talked  about role palliative care. Therapeutic listening and emotional support provided. Questions answered to satisfaction. Update given to staff as well as DNR [place] forms  Palliative Care was asked to help to continue to address goals of care.   CODE STATUS: DNR  PPS: 30% HOSPICE ELIGIBILITY/DIAGNOSIS: TBD  PAST MEDICAL HISTORY:  Past Medical History:  Diagnosis Date  . Acute lower UTI 05/17/2019  . AKI (acute kidney injury) ([place]) 05/16/2018  . Anemia   . Bradycardia   . Cataract   . CKD (chronic kidney disease) 05/14/2018  . CKD (chronic kidney disease), stage IV ([place])   . COVID-19 virus infection 05/15/2019  . Dementia ([place])   . DNR (do not resuscitate) discussion   . Duodenal perforation ([place])   . Encephalopathy acute 05/14/2018  . HTN (hypertension) 05/14/2018  . Hypertension   . Hypothyroidism   . Mitral valve mass   . Palliative care by specialist   . Pneumonia due to COVID-19 virus 05/15/2019  . RBBB   . Second degree AV block, Mobitz type I   . Second degree AV block, Mobitz type II   . Syncope   . Urinary frequency 03/13/2019    SOCIAL HX:  Social History   Tobacco Use  . Smoking status: Former Research scientist (life sciences)  . Smokeless tobacco: Never Used  Substance Use Topics  . Alcohol use: Never    ALLERGIES:  Allergies  Allergen Reactions  . Codeine Anaphylaxis  . Sulfa Antibiotics Anaphylaxis      PHYSICAL EXAM:   General: NAD, debilitated confused female Cardiovascular: regular rate and rhythm Pulmonary: clear ant fields Neurological: functionally quadriplegic  Daisy Lueth Ihor Gully, NP

## 2020-02-27 NOTE — Addendum Note (Signed)
Addended by: Jari Favre C on: 02/27/2020 05:00 PM   Modules accepted: Level of Service

## 2020-02-28 ENCOUNTER — Encounter: Payer: Self-pay | Admitting: Nurse Practitioner

## 2020-02-28 ENCOUNTER — Other Ambulatory Visit: Payer: Self-pay

## 2020-02-28 ENCOUNTER — Other Ambulatory Visit: Payer: Medicare Other | Admitting: Nurse Practitioner

## 2020-02-28 DIAGNOSIS — F02818 Dementia in other diseases classified elsewhere, unspecified severity, with other behavioral disturbance: Secondary | ICD-10-CM

## 2020-02-28 DIAGNOSIS — Z515 Encounter for palliative care: Secondary | ICD-10-CM

## 2020-02-28 DIAGNOSIS — F0281 Dementia in other diseases classified elsewhere with behavioral disturbance: Secondary | ICD-10-CM

## 2020-02-28 NOTE — Progress Notes (Addendum)
[facility] Consult Note Telephone: [phone]  Fax: [phone]  PATIENT NAME: Daisy Parker DOB: December 31, 1940 MRN: [id number] PRIMARY CARE PROVIDER:Dr Yves Dill PROVIDER:Dr Hodges/Pierce Forbes daughter [phone]  RECOMMENDATIONS and PLAN: 1.SHF:WYOV DNR; placed in vynca; will complete MOST at next visit 2 week, give daughter time to continue to think about decisions  2.Palliative care encounter; Palliative medicine team will continue to support patient, patient's family, and medical team. Visit consisted of counseling and education dealing with the complex and emotionally intense issues of symptom management and palliative care in the setting of serious and potentially life-threatening illness  3. F/u 1 month for ongoing monitoring weight, functional and cognitive decline  I spent 60 minutes providing this consultation,  Starting at 11:45am. More than 50% of the time in this consultation was spent coordinating communication.   HISTORY OF PRESENT ILLNESS:  Daisy Parker is a 79 y.o. 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 Daisy Parker is bed-bound, total ADL dependents including incontinence. Daisy Parker does required to be fed. Weight 168.2 with BMI 26.3 currently on a regular diet, regular texture, regular liquid consistency with med plus supplement. No recent falls, wounds, infections, hospitalizations. DNR in place. Staff endorses no new changes or concerns. Palliative care visit today requested by Daisy Parker, Daisy Parker daughter for goals  of care, ongoing discussions, monitoring appetite, weight, cognitive decline. At present Daisy Parker is lying in bed. Daisy Parker appears elderly, debilitated, no distress. No visitors present. I visited and observed Daisy Parker. We talked about Palliative care visit. Daisy Parker with confusion was not able to understand. Daisy Parker smiled, making eye contact. Daisy Parker is cooperative with assessment. I called Daisy Rowan, Daisy. Parker daughter. Clinical update given. Daisy Parker and I talked with Daisy Parker by speakerphone. Daisy Parker did say some words, answer some simple questions. Daisy Parker endorses she plans on visiting tomorrow. Medical goals review. Will follow up with completion of most form and two weeks once Daisy Parker further reviews and makes final decisions about goals of care. Therapeutic listening and emotional support provided. Contact information. Questions answered to satisfaction. No new changes at present time updated staff.  Palliative Care was asked to help to continue to address goals of care.   CODE STATUS: DNR  PPS: 30% HOSPICE ELIGIBILITY/DIAGNOSIS: TBD  PAST MEDICAL HISTORY:  Past Medical History:  Diagnosis Date  . Acute lower UTI 05/17/2019  . AKI (acute kidney injury) ([place]) 05/16/2018  . Anemia   . Bradycardia   . Cataract   . CKD (chronic kidney disease) 05/14/2018  . CKD (chronic kidney disease), stage IV ([place])   . COVID-19 virus infection 05/15/2019  . Dementia ([place])   . DNR (do not resuscitate) discussion   . Duodenal perforation ([place])   . Encephalopathy acute 05/14/2018  . HTN (hypertension) 05/14/2018  . Hypertension   . Hypothyroidism   . Mitral valve mass   . Palliative care by specialist   . Pneumonia due to COVID-19 virus 05/15/2019  . RBBB   . Second degree AV block, Mobitz type I   . Second degree AV block, Mobitz type II   . Syncope   . Urinary frequency 03/13/2019  SOCIAL HX:  Social History   Tobacco Use  . Smoking status: Former Research scientist (life sciences)  . Smokeless tobacco: Never  Used  Substance Use Topics  . Alcohol use: Never    ALLERGIES:  Allergies  Allergen Reactions  . Codeine Anaphylaxis  . Sulfa Antibiotics Anaphylaxis       PHYSICAL EXAM:   General: NAD, frail appearing, thin, debilitated, confused female Cardiovascular: regular rate and rhythm Pulmonary: clear ant fields Neurological: bed bound, functional quadriplegic  Daisy Parker Daisy Gully, NP

## 2020-03-05 IMAGING — DX DG CHEST 1V PORT
1 series · 1 of 1 positions shown · non-contrast
Comparison: March 12, 2019.

CLINICAL DATA: ROWVU-FI positive, hypoxia.

EXAM:
PORTABLE CHEST 1 VIEW

[chest ap]
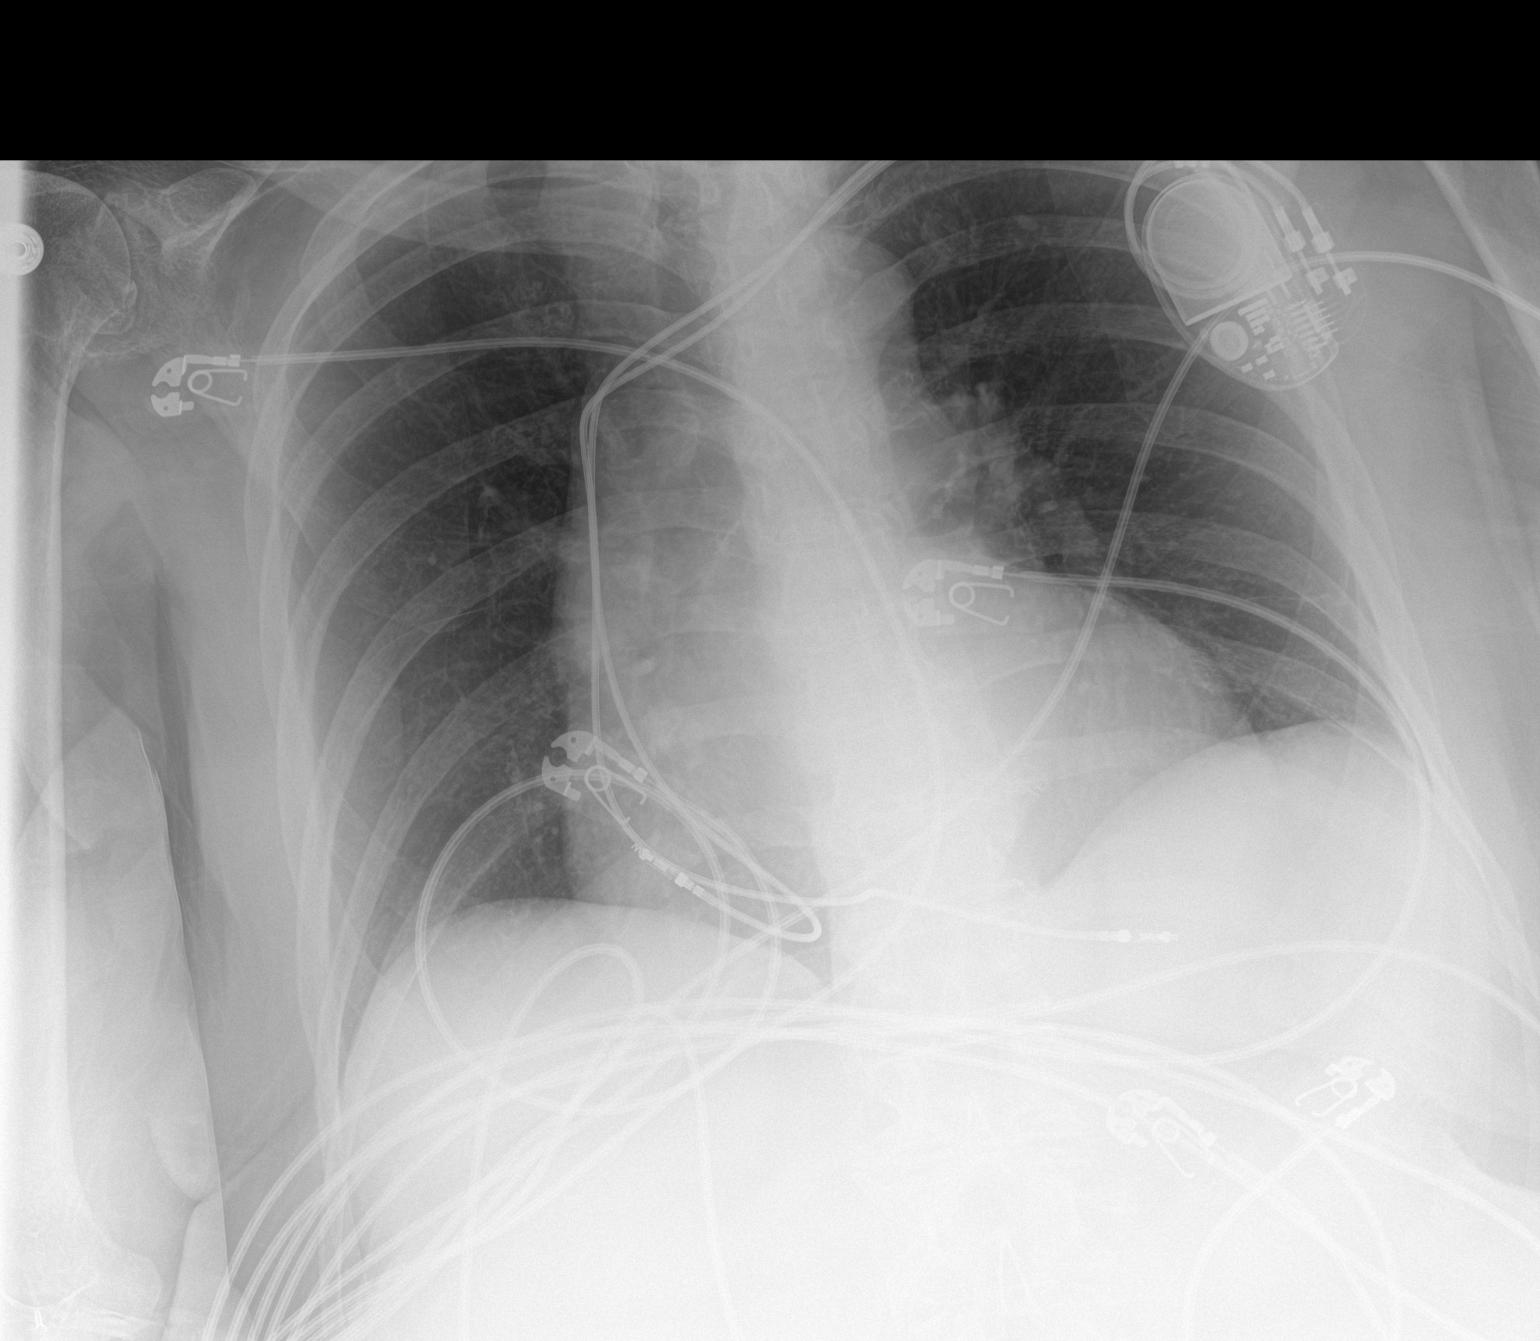

[1 of 1 positions shown; findings below may reference images not displayed]

FINDINGS: Stable cardiomegaly. No pneumothorax or pleural effusion is noted.
Left-sided pacemaker is unchanged in position. Both lungs are clear.
The visualized skeletal structures are unremarkable.
IMPRESSION: No active disease.

## 2020-03-18 ENCOUNTER — Non-Acute Institutional Stay: Payer: Medicare Other | Admitting: Nurse Practitioner

## 2020-03-18 ENCOUNTER — Encounter: Payer: Self-pay | Admitting: Nurse Practitioner

## 2020-03-18 DIAGNOSIS — Z515 Encounter for palliative care: Secondary | ICD-10-CM

## 2020-03-18 DIAGNOSIS — G308 Other Alzheimer's disease: Secondary | ICD-10-CM

## 2020-03-18 DIAGNOSIS — F0281 Dementia in other diseases classified elsewhere with behavioral disturbance: Secondary | ICD-10-CM

## 2020-03-18 NOTE — Progress Notes (Signed)
[facility] Consult Note Telephone: [phone]  Fax: [phone]  PATIENT NAME: Bryanda Mikel DOB: 1941/02/24 MRN: [id number]  PRIMARY CARE PROVIDER:  [place] daughter [phone]  RECOMMENDATIONS and PLAN: 1.BHA:LPFX DNR;in vynca; ongoing discussion of MOST form  2.Palliative care encounter; Palliative medicine team will continue to support patient, patient's family, and medical team. Visit consisted of counseling and education dealing with the complex and emotionally intense issues of symptom management and palliative care in the setting of serious and potentially life-threatening illness  3. F/u after ivf's with lab resulted for renal function with ongoing discussions of complex medical decision making  12/ 10/2019 wybc 7.2, hemoglobin 10, hematocrit 31, platelets 164, sodium 140, potassium 5.2, chloride 103, calcium 10.8, bun 57.6, creatinine 3.12, glucose 84, albumin 3.5, total protein 5.3, calculated GF R17 based on weight.  I spent 65 minutes providing this consultation,  Starting at [facility]. More than 50% of the time in this consultation was spent coordinating communication.   HISTORY OF PRESENT ILLNESS:  Arshia Spellman is a 79 y.o. 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 Today's visit requested by [name], Ms Hendryx's daughter for ongoing monitoring weights, disease progression, appetite, ongoing medical complexity decision-making. Ms Freeze does require staff to assist her with turning, positioning, adl's, bathing, dressing as she has remained bed-bound. Ms Dohmen does require assistance with feeding light tray setup and current weight is  168.2 with BMI 26.3. Ms Nielsen remains on a regular diet regular texture regular liquid consistency with Med Plus supplement. At present Ms Mccranie is lying in bed, appears comfortable, smiling, no visitors present. I visit and observed Ms Whipkey. Ms Berch was cooperative with the assessment, making eye contact and asking questions. Ms Tompkins was appropriate answering questions the nine symptoms of pain though limited with cognitive impairment. Emotional support provided. I called [name], Ms Beaulieu daughter. [name] had already received a phone call from [place] Nurse Practitioner with update on labs and plan to receive IV fluids and repeat Labs. We talked at length about chronic kidney disease with progression. We talked about possible dehydration causing her bun and creatinine to climb and will see what happens with IV fluids over the next 24 hours. We talked about her appetite. We talked about chronic disease progression. We talked about medical complexity with decision-making and goals of care. We talked about focus on quality of life in comfort. [name] endorses that she was going to visit with Ms ramisa duman. We talked about clinical presentation of Ms Oelkers today and Palliative care visit. Discussed will follow with ongoing discussions and repeat labs in two days once fluids have completed. [name] in agreement. Therapeutic listening and emotional support provided. Contact information. Questions answered the satisfaction. I updated nursing staff.  Palliative Care was asked to help to continue to address goals of care.   CODE STATUS: DNR  PPS: 30% HOSPICE ELIGIBILITY/DIAGNOSIS: TBD  PAST MEDICAL HISTORY:  Past Medical History:  Diagnosis Date  . Acute lower UTI 05/17/2019  . AKI (acute kidney injury) ([place]) 05/16/2018  . Anemia   . Bradycardia   . Cataract   . CKD (chronic kidney disease) 05/14/2018  . CKD (chronic kidney disease), stage IV ([place])   . COVID-19 virus infection 05/15/2019   .  Dementia ([place])   . DNR (do not resuscitate) discussion   . Duodenal perforation ([place])   . Encephalopathy acute 05/14/2018  . HTN (hypertension) 05/14/2018  . Hypertension   . Hypothyroidism   . Mitral valve mass   . Palliative care by specialist   . Pneumonia due to COVID-19 virus 05/15/2019  . RBBB   . Second degree AV block, Mobitz type I   . Second degree AV block, Mobitz type II   . Syncope   . Urinary frequency 03/13/2019    SOCIAL HX:  Social History   Tobacco Use  . Smoking status: Former Research scientist (life sciences)  . Smokeless tobacco: Never Used  Substance Use Topics  . Alcohol use: Never    ALLERGIES:  Allergies  Allergen Reactions  . Codeine Anaphylaxis  . Sulfa Antibiotics Anaphylaxis     PHYSICAL EXAM:   General: NAD, frail appearing, thin debilitated, confused female Cardiovascular: regular rate and rhythm Pulmonary: clear ant fields Neurological: bedbound  Luisalberto Beegle Ihor Gully, NP

## 2020-03-19 ENCOUNTER — Other Ambulatory Visit: Payer: Self-pay

## 2020-03-24 ENCOUNTER — Telehealth: Payer: Self-pay | Admitting: Nurse Practitioner

## 2020-03-24 NOTE — Telephone Encounter (Signed)
I called Timmothy Sours, Ms Grimme's daughter for update on Labs with GFR 20. We talked about Hospice eligibility as [facility], Ms Tamashiro to be Hospice eligible. We talked about what hospice would provide under Medicare benefit. We talked about medical goals of care. We talked about role of Palliative care. Discuss that will follow up with Ms [place] Wednesday for ongoing discussion of medical complexity decision-making, further discussion of possibility of Hospice and chronic disease management. Zoey in agreement. Therapeutic listening and emotional support provided. Contact information. Questions answered to satisfaction.  Total time spent 20 minutes  Documentation 5 minutes  Phone discussion 15 minutes

## 2020-03-25 ENCOUNTER — Non-Acute Institutional Stay: Payer: Medicare Other | Admitting: Nurse Practitioner

## 2020-03-25 ENCOUNTER — Encounter: Payer: Self-pay | Admitting: Nurse Practitioner

## 2020-03-25 DIAGNOSIS — F02818 Dementia in other diseases classified elsewhere, unspecified severity, with other behavioral disturbance: Secondary | ICD-10-CM

## 2020-03-25 DIAGNOSIS — Z515 Encounter for palliative care: Secondary | ICD-10-CM

## 2020-03-25 NOTE — Progress Notes (Signed)
[facility] Consult Note Telephone: [phone]  Fax: [phone]  PATIENT NAME: Daisy Parker DOB: 1940-11-23 MRN: [id number]  PRIMARY CARE PROVIDER:  [place] daughter [phone]  RECOMMENDATIONS and PLAN: 1.LZJ:QBHA DNR;in vynca; discussed Hospice, ongoing discussions with [facility], [name], will determine if need to proceed with Hospice. Hospice physicians deemed eligible.   2.Palliative care encounter; Palliative medicine team will continue to support patient, patient's family, and medical team. Visit consisted of counseling and education dealing with the complex and emotionally intense issues of symptom management and palliative care in the setting of serious and potentially life-threatening illness  3. F/u after ivf's with lab resulted for renal function with ongoing discussions of complex medical decision making  I spent 65 minutes providing this consultation,  Starting at 1:00pm. More than 50% of the time in this consultation was spent coordinating communication.   HISTORY OF PRESENT ILLNESS:  Daisy Parker is a 79 y.o. 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 [name] continues to reside in [place]. [name] does remain bed-bound, total ADL dependent including bathing, dressing, toileting, transfers, turning in positioning. [name] requires assistance with feeding and appetite remains declined though there are times when she eats better than others. Ongoing discussions of medical complexity decision-making with recent worsening of chronic kidney disease to most recent Labs GF R20 after receiving one liter  of fluid. Palliative care visit today to revisit medical goals including option of possible Hospice. Staff endorses no other changes at present time. [name] is comfortable lying in her bed at present. [name] does make eye contact, smiling and interactive mumbling with words repeating the same sentences. [name] was cooperative with assessment. No meaningful discussion with cognitive impairment. Emotional support provided. I did call [name] goes by [employer] daughter. We talked about palliative care visit with [name]. We talked about her visit with [name] this past week. We talked about medical goals of care, updated on lab results with GF are 20. We talked about option of Hospice Services. Daisy Parker endorses her wishes are to move forward with Hospice. We talked about the facility procedure for Hospice referral, encouraged Daisy Parker to contact DON, Daisy Parker for further discussion of [employer]. Daisy Parker in agreement. Therapeutic listening and emotional support provided. Contact information. Questions answered is satisfaction. I updated unit manager Daisy Parker, DON about Daisy Parker's wishes for Hospice Services for  if that is an option at the facility. Daisy Parker and I talked at length about prognosis, disease progression, overall decline. We talked about coping strategies, caregiver stressors. Therapeutic listening and emotional support provided. Discussed with Daisy Parker will re contact after a decision is made either to continue PC or transition to Hospice. Daisy Parker in agreement  Palliative Care was asked to help to continue to address goals of care.   CODE STATUS: DNR  PPS: 30% HOSPICE ELIGIBILITY/DIAGNOSIS: TBD  PAST MEDICAL HISTORY:  Past Medical History:  Diagnosis Date  . Acute lower UTI 05/17/2019  . AKI (acute kidney injury) ([place]) 05/16/2018  . Anemia   . Bradycardia   . Cataract   . CKD (chronic kidney disease) 05/14/2018  . CKD (chronic kidney disease), stage IV ([place])   . COVID-19 virus  infection 05/15/2019  .  Dementia ([place])   . DNR (do not resuscitate) discussion   . Duodenal perforation ([place])   . Encephalopathy acute 05/14/2018  . HTN (hypertension) 05/14/2018  . Hypertension   . Hypothyroidism   . Mitral valve mass   . Palliative care by specialist   . Pneumonia due to COVID-19 virus 05/15/2019  . RBBB   . Second degree AV block, Mobitz type I   . Second degree AV block, Mobitz type II   . Syncope   . Urinary frequency 03/13/2019    SOCIAL HX:  Social History   Tobacco Use  . Smoking status: Former Research scientist (life sciences)  . Smokeless tobacco: Never Used  Substance Use Topics  . Alcohol use: Never    ALLERGIES:  Allergies  Allergen Reactions  . Codeine Anaphylaxis  . Sulfa Antibiotics Anaphylaxis      PHYSICAL EXAM:   General: NAD, frail appearing, thin, debilitated, confused female Cardiovascular: regular rate and rhythm Pulmonary: clear ant fields Neurological: functional quadriplegic Daisy Parker Daisy Gully, NP

## 2020-03-26 ENCOUNTER — Other Ambulatory Visit: Payer: Self-pay

## 2020-09-08 ENCOUNTER — Ambulatory Visit (INDEPENDENT_AMBULATORY_CARE_PROVIDER_SITE_OTHER)

## 2020-09-08 DIAGNOSIS — I441 Atrioventricular block, second degree: Secondary | ICD-10-CM

## 2020-09-09 LAB — CUP PACEART REMOTE DEVICE CHECK
Battery Remaining Longevity: 107 mo
Battery Remaining Percentage: 95.5 %
Battery Voltage: 3.01 V
Brady Statistic AP VP Percent: 58 %
Brady Statistic AP VS Percent: 0 %
Brady Statistic AS VP Percent: 42 %
Brady Statistic AS VS Percent: 0 %
Brady Statistic RA Percent Paced: 1 %
Brady Statistic RV Percent Paced: 99 %
Date Time Interrogation Session: 20220531040013
Implantable Lead Implant Date: 20201130
Implantable Lead Implant Date: 20201130
Implantable Lead Location: 753859
Implantable Lead Location: 753860
Implantable Pulse Generator Implant Date: 20201130
Lead Channel Impedance Value: 350 Ohm
Lead Channel Impedance Value: 610 Ohm
Lead Channel Pacing Threshold Amplitude: 0.375 V
Lead Channel Pacing Threshold Amplitude: 0.5 V
Lead Channel Pacing Threshold Pulse Width: 0.5 ms
Lead Channel Pacing Threshold Pulse Width: 0.5 ms
Lead Channel Sensing Intrinsic Amplitude: 2.2 mV
Lead Channel Sensing Intrinsic Amplitude: 8.3 mV
Lead Channel Setting Pacing Amplitude: 0.625
Lead Channel Setting Pacing Amplitude: 3.5 V
Lead Channel Setting Pacing Pulse Width: 0.5 ms
Lead Channel Setting Sensing Sensitivity: 2 mV
Pulse Gen Model: 2272
Pulse Gen Serial Number: 9182735

## 2020-09-30 NOTE — Progress Notes (Signed)
Remote pacemaker transmission.   

## 2020-11-19 ENCOUNTER — Telehealth: Payer: Self-pay

## 2020-11-19 NOTE — Telephone Encounter (Signed)
Follow up conversation made. Advised that Daisy Parker has a pacemaker and the device will not allow her heart rate to go below 60 bpm. Appreciative of call.   Presenting rhythm AF/VP 60's.

## 2020-11-19 NOTE — Telephone Encounter (Signed)
I spoke with the hospice nurse Angela Nevin and let her know we still did not get the transmission.   I called the nurse Lucina Mellow  at [phone], and helped her send the manual transmission. Transmission received.   I told the hospice nurse that when we receive the transmissions I will have the device nurse review it and give her a call back. Carla phone number is [phone].

## 2020-11-19 NOTE — Telephone Encounter (Signed)
Patient hospice nurse called stating that the patient was told that her pulse was low and she wanted to know if her device showed anything. I let her know we need her to send a manual transmission as the last one we have was 09/08/2020. I let her know how to send one and she is going to call the facility and let them know to send one and then give [place] a call back.

## 2020-12-08 ENCOUNTER — Ambulatory Visit (INDEPENDENT_AMBULATORY_CARE_PROVIDER_SITE_OTHER)

## 2020-12-08 DIAGNOSIS — I441 Atrioventricular block, second degree: Secondary | ICD-10-CM

## 2020-12-08 LAB — CUP PACEART REMOTE DEVICE CHECK
Battery Remaining Longevity: 86 mo
Battery Remaining Percentage: 83 %
Battery Voltage: 3.01 V
Brady Statistic AP VP Percent: 58 %
Brady Statistic AP VS Percent: 0 %
Brady Statistic AS VP Percent: 42 %
Brady Statistic AS VS Percent: 0 %
Brady Statistic RA Percent Paced: 1 %
Brady Statistic RV Percent Paced: 99 %
Date Time Interrogation Session: 20220830040014
Implantable Lead Implant Date: 20201130
Implantable Lead Implant Date: 20201130
Implantable Lead Location: 753859
Implantable Lead Location: 753860
Implantable Pulse Generator Implant Date: 20201130
Lead Channel Impedance Value: 380 Ohm
Lead Channel Impedance Value: 610 Ohm
Lead Channel Pacing Threshold Amplitude: 0.5 V
Lead Channel Pacing Threshold Amplitude: 0.75 V
Lead Channel Pacing Threshold Pulse Width: 0.5 ms
Lead Channel Pacing Threshold Pulse Width: 0.5 ms
Lead Channel Sensing Intrinsic Amplitude: 2.2 mV
Lead Channel Sensing Intrinsic Amplitude: 7.7 mV
Lead Channel Setting Pacing Amplitude: 1 V
Lead Channel Setting Pacing Amplitude: 3.5 V
Lead Channel Setting Pacing Pulse Width: 0.5 ms
Lead Channel Setting Sensing Sensitivity: 2 mV
Pulse Gen Model: 2272
Pulse Gen Serial Number: 9182735

## 2020-12-21 NOTE — Progress Notes (Signed)
Remote pacemaker transmission.   

## 2021-01-09 DEATH — deceased
